# Patient Record
Sex: Female | Born: 1935 | Race: White | Hispanic: No | Smoking: Never smoker
Health system: Southern US, Community
[De-identification: ages and names within clinical notes are randomized; demographics above are authoritative.]

## PROBLEM LIST (undated history)

## (undated) DIAGNOSIS — C4490 Unspecified malignant neoplasm of skin, unspecified: Secondary | ICD-10-CM

## (undated) DIAGNOSIS — I4891 Unspecified atrial fibrillation: Secondary | ICD-10-CM

## (undated) DIAGNOSIS — K219 Gastro-esophageal reflux disease without esophagitis: Secondary | ICD-10-CM

## (undated) DIAGNOSIS — E039 Hypothyroidism, unspecified: Secondary | ICD-10-CM

## (undated) DIAGNOSIS — I10 Essential (primary) hypertension: Secondary | ICD-10-CM

## (undated) DIAGNOSIS — I779 Disorder of arteries and arterioles, unspecified: Secondary | ICD-10-CM

## (undated) DIAGNOSIS — R222 Localized swelling, mass and lump, trunk: Secondary | ICD-10-CM

## (undated) DIAGNOSIS — I739 Peripheral vascular disease, unspecified: Secondary | ICD-10-CM

## (undated) DIAGNOSIS — K625 Hemorrhage of anus and rectum: Secondary | ICD-10-CM

## (undated) DIAGNOSIS — I359 Nonrheumatic aortic valve disorder, unspecified: Secondary | ICD-10-CM

## (undated) DIAGNOSIS — F419 Anxiety disorder, unspecified: Secondary | ICD-10-CM

## (undated) DIAGNOSIS — R911 Solitary pulmonary nodule: Secondary | ICD-10-CM

## (undated) DIAGNOSIS — Z8673 Personal history of transient ischemic attack (TIA), and cerebral infarction without residual deficits: Secondary | ICD-10-CM

## (undated) HISTORY — DX: Unspecified malignant neoplasm of skin, unspecified: C44.90

## (undated) HISTORY — DX: Anxiety disorder, unspecified: F41.9

## (undated) HISTORY — DX: Hypothyroidism, unspecified: E03.9

## (undated) HISTORY — DX: Solitary pulmonary nodule: R91.1

## (undated) HISTORY — PX: CATARACT EXTRACTION BILATERAL W/ ANTERIOR VITRECTOMY: SHX1304

## (undated) HISTORY — DX: Disorder of arteries and arterioles, unspecified: I77.9

## (undated) HISTORY — DX: Unspecified atrial fibrillation: I48.91

## (undated) HISTORY — DX: Personal history of transient ischemic attack (TIA), and cerebral infarction without residual deficits: Z86.73

## (undated) HISTORY — PX: OTHER SURGICAL HISTORY: SHX169

## (undated) HISTORY — DX: Gastro-esophageal reflux disease without esophagitis: K21.9

## (undated) HISTORY — DX: Nonrheumatic aortic valve disorder, unspecified: I35.9

## (undated) HISTORY — DX: Essential (primary) hypertension: I10

## (undated) HISTORY — DX: Localized swelling, mass and lump, trunk: R22.2

## (undated) HISTORY — PX: TONSILLECTOMY: SUR1361

## (undated) HISTORY — PX: ABDOMINAL HYSTERECTOMY: SHX81

## (undated) HISTORY — DX: Peripheral vascular disease, unspecified: I73.9

---

## 1998-03-15 ENCOUNTER — Other Ambulatory Visit: Admission: RE | Admit: 1998-03-15 | Discharge: 1998-03-15 | Payer: Self-pay | Admitting: *Deleted

## 1999-04-20 ENCOUNTER — Ambulatory Visit (HOSPITAL_COMMUNITY): Admission: RE | Admit: 1999-04-20 | Discharge: 1999-04-20 | Payer: Self-pay | Admitting: *Deleted

## 1999-07-20 ENCOUNTER — Other Ambulatory Visit: Admission: RE | Admit: 1999-07-20 | Discharge: 1999-07-20 | Payer: Self-pay | Admitting: *Deleted

## 2000-01-03 ENCOUNTER — Encounter: Admission: RE | Admit: 2000-01-03 | Discharge: 2000-01-03 | Payer: Self-pay | Admitting: *Deleted

## 2000-01-03 ENCOUNTER — Encounter: Payer: Self-pay | Admitting: *Deleted

## 2000-08-21 ENCOUNTER — Other Ambulatory Visit: Admission: RE | Admit: 2000-08-21 | Discharge: 2000-08-21 | Payer: Self-pay | Admitting: *Deleted

## 2001-05-21 ENCOUNTER — Ambulatory Visit (HOSPITAL_COMMUNITY): Admission: RE | Admit: 2001-05-21 | Discharge: 2001-05-21 | Payer: Self-pay | Admitting: Internal Medicine

## 2001-05-21 ENCOUNTER — Encounter: Payer: Self-pay | Admitting: Internal Medicine

## 2002-10-16 ENCOUNTER — Encounter: Payer: Self-pay | Admitting: *Deleted

## 2002-10-16 ENCOUNTER — Emergency Department (HOSPITAL_COMMUNITY): Admission: EM | Admit: 2002-10-16 | Discharge: 2002-10-16 | Payer: Self-pay | Admitting: Emergency Medicine

## 2002-11-04 ENCOUNTER — Ambulatory Visit (HOSPITAL_COMMUNITY): Admission: RE | Admit: 2002-11-04 | Discharge: 2002-11-04 | Payer: Self-pay | Admitting: Internal Medicine

## 2003-12-28 ENCOUNTER — Other Ambulatory Visit: Admission: RE | Admit: 2003-12-28 | Discharge: 2003-12-28 | Payer: Self-pay | Admitting: *Deleted

## 2005-02-28 ENCOUNTER — Other Ambulatory Visit: Admission: RE | Admit: 2005-02-28 | Discharge: 2005-02-28 | Payer: Self-pay | Admitting: *Deleted

## 2005-11-20 ENCOUNTER — Encounter: Admission: RE | Admit: 2005-11-20 | Discharge: 2005-11-20 | Payer: Self-pay | Admitting: Orthopedic Surgery

## 2006-04-22 ENCOUNTER — Ambulatory Visit: Payer: Self-pay | Admitting: Cardiology

## 2006-05-09 ENCOUNTER — Ambulatory Visit: Payer: Self-pay | Admitting: Cardiology

## 2006-06-10 ENCOUNTER — Ambulatory Visit: Payer: Self-pay | Admitting: Cardiology

## 2006-09-30 ENCOUNTER — Other Ambulatory Visit: Admission: RE | Admit: 2006-09-30 | Discharge: 2006-09-30 | Payer: Self-pay | Admitting: *Deleted

## 2007-12-16 ENCOUNTER — Other Ambulatory Visit: Admission: RE | Admit: 2007-12-16 | Discharge: 2007-12-16 | Payer: Self-pay | Admitting: *Deleted

## 2008-06-17 ENCOUNTER — Ambulatory Visit: Payer: Self-pay | Admitting: Cardiology

## 2008-07-08 ENCOUNTER — Encounter: Payer: Self-pay | Admitting: Cardiology

## 2008-08-31 ENCOUNTER — Ambulatory Visit: Payer: Self-pay | Admitting: Cardiology

## 2008-09-01 ENCOUNTER — Encounter: Payer: Self-pay | Admitting: Physician Assistant

## 2008-11-24 ENCOUNTER — Encounter: Payer: Self-pay | Admitting: Cardiology

## 2008-11-24 ENCOUNTER — Ambulatory Visit: Payer: Self-pay | Admitting: Cardiology

## 2008-12-07 ENCOUNTER — Ambulatory Visit: Payer: Self-pay | Admitting: Cardiology

## 2008-12-27 ENCOUNTER — Encounter: Payer: Self-pay | Admitting: Cardiology

## 2009-01-11 ENCOUNTER — Encounter: Payer: Self-pay | Admitting: Cardiology

## 2009-01-20 ENCOUNTER — Ambulatory Visit: Payer: Self-pay | Admitting: Cardiology

## 2009-03-22 ENCOUNTER — Encounter: Payer: Self-pay | Admitting: Cardiology

## 2009-03-30 ENCOUNTER — Encounter: Payer: Self-pay | Admitting: Cardiology

## 2009-07-19 ENCOUNTER — Encounter: Payer: Self-pay | Admitting: Cardiology

## 2009-10-03 DIAGNOSIS — R930 Abnormal findings on diagnostic imaging of skull and head, not elsewhere classified: Secondary | ICD-10-CM | POA: Insufficient documentation

## 2009-10-03 DIAGNOSIS — E785 Hyperlipidemia, unspecified: Secondary | ICD-10-CM

## 2009-10-03 DIAGNOSIS — R002 Palpitations: Secondary | ICD-10-CM

## 2010-02-14 ENCOUNTER — Ambulatory Visit: Payer: Self-pay | Admitting: Cardiology

## 2010-02-14 DIAGNOSIS — I4891 Unspecified atrial fibrillation: Secondary | ICD-10-CM

## 2010-03-06 ENCOUNTER — Encounter: Payer: Self-pay | Admitting: Cardiology

## 2010-03-08 ENCOUNTER — Encounter: Payer: Self-pay | Admitting: Cardiology

## 2010-03-27 ENCOUNTER — Ambulatory Visit: Payer: Self-pay | Admitting: Cardiology

## 2010-03-27 DIAGNOSIS — I10 Essential (primary) hypertension: Secondary | ICD-10-CM | POA: Insufficient documentation

## 2010-03-28 ENCOUNTER — Telehealth (INDEPENDENT_AMBULATORY_CARE_PROVIDER_SITE_OTHER): Payer: Self-pay | Admitting: *Deleted

## 2010-03-29 ENCOUNTER — Ambulatory Visit: Payer: Self-pay | Admitting: Cardiology

## 2010-03-30 ENCOUNTER — Emergency Department (HOSPITAL_COMMUNITY): Admission: EM | Admit: 2010-03-30 | Discharge: 2010-03-30 | Payer: Self-pay | Admitting: Emergency Medicine

## 2010-03-30 ENCOUNTER — Encounter: Payer: Self-pay | Admitting: Cardiology

## 2010-03-31 ENCOUNTER — Telehealth (INDEPENDENT_AMBULATORY_CARE_PROVIDER_SITE_OTHER): Payer: Self-pay | Admitting: *Deleted

## 2010-04-03 ENCOUNTER — Encounter: Payer: Self-pay | Admitting: Cardiology

## 2010-04-17 ENCOUNTER — Encounter: Payer: Self-pay | Admitting: Cardiology

## 2010-06-15 ENCOUNTER — Observation Stay (HOSPITAL_COMMUNITY): Admission: EM | Admit: 2010-06-15 | Discharge: 2010-06-16 | Payer: Self-pay | Admitting: Emergency Medicine

## 2010-06-15 ENCOUNTER — Ambulatory Visit: Payer: Self-pay | Admitting: Cardiology

## 2010-06-21 ENCOUNTER — Encounter: Payer: Self-pay | Admitting: Physician Assistant

## 2010-06-23 DIAGNOSIS — E876 Hypokalemia: Secondary | ICD-10-CM

## 2010-07-05 ENCOUNTER — Encounter: Payer: Self-pay | Admitting: Cardiology

## 2010-07-07 ENCOUNTER — Encounter (INDEPENDENT_AMBULATORY_CARE_PROVIDER_SITE_OTHER): Payer: Self-pay | Admitting: *Deleted

## 2010-07-19 ENCOUNTER — Telehealth (INDEPENDENT_AMBULATORY_CARE_PROVIDER_SITE_OTHER): Payer: Self-pay | Admitting: *Deleted

## 2010-07-19 ENCOUNTER — Ambulatory Visit: Payer: Self-pay | Admitting: Cardiology

## 2010-08-09 ENCOUNTER — Ambulatory Visit: Payer: Self-pay | Admitting: Cardiology

## 2010-08-16 ENCOUNTER — Telehealth (INDEPENDENT_AMBULATORY_CARE_PROVIDER_SITE_OTHER): Payer: Self-pay | Admitting: *Deleted

## 2010-09-13 ENCOUNTER — Ambulatory Visit: Payer: Self-pay | Admitting: Cardiology

## 2010-09-20 ENCOUNTER — Encounter: Payer: Self-pay | Admitting: Cardiology

## 2010-09-20 ENCOUNTER — Encounter: Payer: Self-pay | Admitting: Physician Assistant

## 2010-09-21 ENCOUNTER — Ambulatory Visit: Payer: Self-pay | Admitting: Cardiology

## 2010-09-22 ENCOUNTER — Encounter: Payer: Self-pay | Admitting: Cardiology

## 2010-11-03 ENCOUNTER — Ambulatory Visit: Payer: Self-pay | Admitting: Cardiology

## 2010-11-03 DIAGNOSIS — R079 Chest pain, unspecified: Secondary | ICD-10-CM | POA: Insufficient documentation

## 2010-11-06 ENCOUNTER — Encounter: Payer: Self-pay | Admitting: Cardiology

## 2010-12-04 ENCOUNTER — Emergency Department (HOSPITAL_COMMUNITY)
Admission: EM | Admit: 2010-12-04 | Discharge: 2010-12-04 | Payer: Self-pay | Source: Home / Self Care | Admitting: Emergency Medicine

## 2010-12-31 ENCOUNTER — Encounter: Payer: Self-pay | Admitting: Cardiology

## 2011-01-01 ENCOUNTER — Encounter: Payer: Self-pay | Admitting: Cardiology

## 2011-01-18 NOTE — Progress Notes (Signed)
Summary: Office Visit/ BLOOD PRESSURE READINGS  Office Visit/ BLOOD PRESSURE READINGS   Imported By: Dorise Hiss 04/03/2010 14:33:36  _____________________________________________________________________  External Attachment:    Type:   Image     Comment:   External Document

## 2011-01-18 NOTE — Assessment & Plan Note (Signed)
Summary: f/u [place]   Visit Type:  Follow-up Primary Provider:  Sherryll Burger  CC:  follow-up visit.  History of Present Illness: the patient is an 77 year old female with a history of paroxysmal atrial fibrillation on Rythmol therapy, but declines Coumadin. The patient also had a recent Lexiscan done with no evidence of ischemia. She does have a strong family history of coronary artery disease but has had no prior myocardial infarction or documented coronary artery disease. The patient was taking Lipitor per her primary care physician but this was discontinued because of myalgias. She has now been switched to gemfibrozil. She appears to be tolerating for propafenone. She does have occasional palpitations particularly in the evening at which point she takes a diazepam improvement in her symptoms. Stress will also cause palpitations. She denies any orthopnea PND she reports no chest pain.  Preventive Screening-Counseling & Management  Alcohol-Tobacco     Smoking Status: never  Current Problems (verified): 1)  Atrial Fibrillation, Paroxysmal  (ICD-427.31) 2)  Abnormal Chest X-ray  (ICD-793.1) 3)  Palpitations  (ICD-785.1) 4)  Hyperlipidemia-mixed  (ICD-272.4)  Current Medications (verified): 1)  Atenolol 50 Mg Tabs (Atenolol) .... Take 1 Tablet By Mouth Twice A Day 2)  Plavix 75 Mg Tabs (Clopidogrel Bisulfate) .... Take 1 Tablet By Mouth Once A Day 3)  Aspir-Low 81 Mg Tbec (Aspirin) .... Take 1 Tablet By Mouth Once A Day 4)  Propafenone Hcl 150 Mg Tabs (Propafenone Hcl) .... Take 1 Tablet By [facility] A Day 5)  Vitamin D 1000 Unit Tabs (Cholecalciferol) .... Take 1 Tablet By Mouth Once A Day 6)  Synthroid 88 Mcg Tabs (Levothyroxine Sodium) .... Take 1 Tablet By Mouth Once A Day 7)  Calcium Magnesium 500-100-500 Liqd (Calcium-Magnesium-Vitamin D) .... Take 2 Tablet By Mouth Two Times A Day 8)  Hydrocodone-Acetaminophen 5-500 Mg Tabs (Hydrocodone-Acetaminophen) .... Take 1/2 Tablet By Mouth Once A  Day As Needed 9)  Diazepam 5 Mg Tabs (Diazepam) .... Take 1 Tablet By Mouth Once A Day As Needed 10)  Bumetanide 1 Mg Tabs (Bumetanide) .... Take 1 Tablet By Mouth Once A Day As Needed 11)  Meclizine Hcl 12.5 Mg Tabs (Meclizine Hcl) .... Take 1 Tablet By Mouth Three Times A Day As Needed 12)  Gemfibrozil 600 Mg Tabs (Gemfibrozil) .... Take 1 Tablet By Mouth Two Times A Day  Allergies (verified): 1)  ! Pcn  Comments:  Nurse/Medical Assistant: The patient is currently on medications but does not know the name or dosage at this time. Instructed to contact our office with details. Will update medication list at that time.  Past History:  Past Medical History: Last updated: 02/14/2010 ABNORMAL CHEST X-RAY (ICD-793.1) PALPITATIONS (ICD-785.1) HYPERLIPIDEMIA-MIXED (ICD-272.4) Hypothyroidism paroxysmal intrafibrillation the patient declines Coumadin therapy  Past Surgical History: Last updated: 10/03/2009 Abdominal Hysterectomy-Total Tonsillectomy  Family History: Last updated: 10/03/2009 Family History of CVA or Stroke:  Family History of Diabetes:   Social History: Last updated: 10/03/2009 Retired  Widowed  Tobacco Use - No.  Alcohol Use - no  Risk Factors: Smoking Status: never (03/27/2010)  Review of Systems       The patient complains of palpitations.  The patient denies fatigue, malaise, fever, weight gain/loss, vision loss, decreased hearing, hoarseness, chest pain, shortness of breath, prolonged cough, wheezing, sleep apnea, coughing up blood, abdominal pain, blood in stool, nausea, vomiting, diarrhea, heartburn, incontinence, blood in urine, muscle weakness, joint pain, leg swelling, rash, skin lesions, headache, fainting, dizziness, depression, anxiety, enlarged lymph nodes, easy bruising or  bleeding, and environmental allergies.    Vital Signs:  Patient profile:   77 year old female Height:      66 inches Weight:      140 pounds Pulse rate:   51 / minute BP  sitting:   173 / 91  (left arm) Cuff size:   regular  Vitals Entered By: Carlye Grippe (March 27, 2010 10:17 AM) CC: follow-up visit   Physical Exam  Additional Exam:  General: Well-developed, well-nourished in no distress head: Normocephalic and atraumatic eyes PERRLA/EOMI intact, conjunctiva and lids normal nose: No deformity or lesions mouth normal dentition, normal posterior pharynx neck: Supple, no JVD.  No masses, thyromegaly or abnormal cervical nodes lungs: Normal breath sounds bilaterally without wheezing.  Normal percussion heart: regular rate and rhythm with normal S1 and S2, no S3 or S4.  PMI is normal.  No pathological murmurs abdomen: Normal bowel sounds, abdomen is soft and nontender without masses, organomegaly or hernias noted.  No hepatosplenomegaly musculoskeletal: Back normal, normal gait muscle strength and tone normal pulsus: Pulse is normal in all 4 extremities Extremities: No peripheral pitting edema neurologic: Alert and oriented x 3 skin: Intact without lesions or rashes cervical nodes: No significant adenopathy psychologic: Normal affect    EKG  Procedure date:  03/27/2010  Findings:      normal sinus rhythm with first-degree AV block heart rate 62 beats per minute  Impression & Recommendations:  Problem # 1:  PALPITATIONS (ICD-785.1) stable improved. Continue propafenone. I told the patient that she has palpitations in the evening to take half of her Valium at half the dose of atenolol Her updated medication list for this problem includes:    Atenolol 50 Mg Tabs (Atenolol) .Marland Kitchen... Take 1 tablet by mouth twice a day    Plavix 75 Mg Tabs (Clopidogrel bisulfate) .Marland Kitchen... Take 1 tablet by mouth once a day    Aspir-low 81 Mg Tbec (Aspirin) .Marland Kitchen... Take 1 tablet by mouth once a day    Propafenone Hcl 150 Mg Tabs (Propafenone hcl) .Marland Kitchen... Take 1 tablet by mouthywice a day  Problem # 2:  HYPERLIPIDEMIA-MIXED (ICD-272.4) followed by primary care physician. The  patient is unable to tolerate some of the statin drugs. I suggested she may try pravastatin. She has been started on gemfibrozil by primary care physician. The following medications were removed from the medication list:    Lipitor 10 Mg Tabs (Atorvastatin calcium) .Marland Kitchen... Take 1 tablet by mouth once a day Her updated medication list for this problem includes:    Gemfibrozil 600 Mg Tabs (Gemfibrozil) .Marland Kitchen... Take 1 tablet by mouth two times a day  Problem # 3:  ATRIAL FIBRILLATION, PAROXYSMAL (ICD-427.31) clinically stable. Continue propafenone. The patient has a junctionall rhythm but it appears that she is now in sinus rhythm. Her updated medication list for this problem includes:    Atenolol 50 Mg Tabs (Atenolol) .Marland Kitchen... Take 1 tablet by mouth twice a day    Plavix 75 Mg Tabs (Clopidogrel bisulfate) .Marland Kitchen... Take 1 tablet by mouth once a day    Aspir-low 81 Mg Tbec (Aspirin) .Marland Kitchen... Take 1 tablet by mouth once a day    Propafenone Hcl 150 Mg Tabs (Propafenone hcl) .Marland Kitchen... Take 1 tablet by mouthywice a day  Orders: EKG w/ Interpretation (93000)  Problem # 4:  ESSENTIAL HYPERTENSION, BENIGN (ICD-401.1) the patientblood pressure is initially elevated. The patient does not know if her home blood pressure cuff is working. She will bring it in this week for calibration  and blood pressure is elevated we will need to adjust her medications. Her updated medication list for this problem includes:    Atenolol 50 Mg Tabs (Atenolol) .Marland Kitchen... Take 1 tablet by mouth twice a day    Aspir-low 81 Mg Tbec (Aspirin) .Marland Kitchen... Take 1 tablet by mouth once a day    Bumetanide 1 Mg Tabs (Bumetanide) .Marland Kitchen... Take 1 tablet by mouth once a day as needed  Patient Instructions: 1)  Bring in BP cuff for calibration check  2)  Follow up in  6 months

## 2011-01-18 NOTE — Progress Notes (Signed)
Summary: elevated bp  Phone Note Other Incoming   Summary of Call: Pt. walked into office wanting to discuss elevated blood pressure and headache.  Stated she had went to [facility] yesterday because she was woke up in the middle of the night with really, really bad headache.  Per Dr. Andee Lineman, need to start beta blocker.  Norvasc 5mg  daily.  Pt. aware. Initial call taken by: Hoover Brunette, LPN,  April 03, 2010 9:25 AM

## 2011-01-18 NOTE — Consult Note (Signed)
Summary: CARDIOLOGY CONSULT/ [facility]  CARDIOLOGY CONSULT/ [facility]   Imported By: Zachary George 11/02/2010 17:00:31  _____________________________________________________________________  External Attachment:    Type:   Image     Comment:   External Document

## 2011-01-18 NOTE — Procedures (Signed)
Summary: Holter and Event/ [facility] END OF SERVICE SUMMARY REPORT  Holter and Event/ [facility] END OF SERVICE SUMMARY REPORT   Imported By: Dorise Hiss 02/13/2010 16:06:50  _____________________________________________________________________  External Attachment:    Type:   Image     Comment:   External Document

## 2011-01-18 NOTE — Letter (Signed)
Summary: [facility] D/C DR. Beatrix Fetters [facility]  [facility] D/C DR. Kirstie Peri   Imported By: Zachary George 11/03/2010 08:32:50  _____________________________________________________________________  External Attachment:    Type:   Image     Comment:   External Document

## 2011-01-18 NOTE — Miscellaneous (Signed)
Summary: rx - Norvasc  Clinical Lists Changes  Medications: Added new medication of NORVASC 5 MG TABS (AMLODIPINE BESYLATE) Take 1 tablet by mouth once a day - Signed Rx of NORVASC 5 MG TABS (AMLODIPINE BESYLATE) Take 1 tablet by mouth once a day;  #30 x 6;  Signed;  Entered by: Hoover Brunette, LPN;  Authorized by: Lewayne Bunting, MD, [facility];  Method used: Telephoned to [facility] # [phone]*, [address], [place], [place]  [address], Ph: [phone] or [phone], Fax: [phone]    Prescriptions: NORVASC 5 MG TABS (AMLODIPINE BESYLATE) Take 1 tablet by mouth once a day  #30 x 6   Entered by:   Hoover Brunette, LPN   Authorized by:   Lewayne Bunting, MD, [facility]   Signed by:   Hoover Brunette, LPN on 52/84/1324   Method used:   Telephoned to ...       [facility]  [address] # [phone]* (retail)       [address]       [place], [place]  [address]       Ph: [phone] or [phone]       Fax: [phone]   RxID:   [id number]

## 2011-01-18 NOTE — Progress Notes (Signed)
Summary: elevated bp, er visit   Phone Note Call from Patient   Summary of Call: Was seen in office yesterday morning.  Woke up in middle of night around 3 a.m. with splitting headache.  BP 197/93  HR 68.  Went to ER and stayed couple of hours and then sent home.  Did give one pill there (Clonidine 0.1mg  x 1) and sent home with rx for HCTZ 12.5mg .  Does have nurse visit in the morning to calibrate her machine with ours.  States she did start her Gemfibrozil last p.m. but after taking did not feel well.  Felt like she was hyper or something, hard to describe.  Do you want her to start HCTZ yet?   Initial call taken by: Hoover Brunette, LPN,  March 28, 2010 8:56 AM  Follow-up for Phone Call        As discussed would give chlortalidone 25 mg and not start gemfibrozil.  Follow-up by: Lewayne Bunting, MD, [facility],  March 29, 2010 1:33 PM  Additional Follow-up for Phone Call Additional follow up Details #1::        Pt. aware of above.  Additional Follow-up by: Hoover Brunette, LPN,  March 31, 2010 10:30 AM

## 2011-01-18 NOTE — Assessment & Plan Note (Signed)
Summary: eph-post [facility] 10/7   Visit Type:  Follow-up Primary Provider:  Sherryll Burger   History of Present Illness: the patient was recently admitted to [facility]. She had chest pain and ruled out for myocardial infarction. She also paroxysmal fibrillation with tachybradycardia syndrome. She had a stress test which was negative. the patient now presents for followup. A few palpitations.  No scp. No sob. No syncope. Tolerating meds. Some leg cramps.  In atrial fibrillation in office. Patient refuses coumadin but willing to consider pradaxa. If holter afib then d/c propafenone and plavix and start pradaxa.   Preventive Screening-Counseling & Management  Alcohol-Tobacco     Smoking Status: never  Current Medications (verified): 1)  Atenolol 50 Mg Tabs (Atenolol) .... Take 1 Tablet By Mouth Twice A Day 2)  Plavix 75 Mg Tabs (Clopidogrel Bisulfate) .... Take 1 Tablet By Mouth Once A Day 3)  Aspir-Low 81 Mg Tbec (Aspirin) .... Take 1 Tablet By Mouth Once A Day 4)  Propafenone Hcl 150 Mg Tabs (Propafenone Hcl) .... Take 1 Tablet By Mouth Twice A Day 5)  Vitamin D 1000 Unit Tabs (Cholecalciferol) .... Take 1 Tablet By Mouth Once A Day 6)  Synthroid 88 Mcg Tabs (Levothyroxine Sodium) .... Take 1 Tablet By Mouth Once A Day 7)  Calcium Magnesium 500-100-500 Liqd (Calcium-Magnesium-Vitamin D) .... Take 2 Tablet By Mouth Two Times A Day 8)  Diazepam 5 Mg Tabs (Diazepam) .... Take 1/2 Tablet By Mouth Once A Day As Needed 9)  Spironolactone 25 Mg Tabs (Spironolactone) .... Take 1 Tablet By Mouth Once A Day 10)  Pravastatin Sodium 20 Mg Tabs (Pravastatin Sodium) .... Take 1 Tablet By Mouth Once A Day 11)  Zantac 150 Mg Tabs (Ranitidine Hcl) .... Take 1 Tablet By Mouth Once A Day As Needed 12)  Clorazepate Dipotassium 3.75 Mg Tabs (Clorazepate Dipotassium) .... Take 1 Tablet By Mouth Once A Day As Needed  Allergies (verified): 1)  ! Pcn  Comments:  Nurse/Medical Assistant: The patient's  medication list and allergies were reviewed with the patient and were updated in the Medication and Allergy Lists.  Past History:  Past Medical History: Last updated: 02/14/2010 ABNORMAL CHEST X-RAY (ICD-793.1) PALPITATIONS (ICD-785.1) HYPERLIPIDEMIA-MIXED (ICD-272.4) Hypothyroidism paroxysmal intrafibrillation the patient declines Coumadin therapy  Review of Systems       The patient complains of palpitations.  The patient denies fatigue, malaise, fever, weight gain/loss, vision loss, decreased hearing, hoarseness, chest pain, shortness of breath, prolonged cough, wheezing, sleep apnea, coughing up blood, abdominal pain, blood in stool, nausea, vomiting, diarrhea, heartburn, incontinence, blood in urine, muscle weakness, joint pain, leg swelling, rash, skin lesions, headache, fainting, dizziness, depression, anxiety, enlarged lymph nodes, easy bruising or bleeding, and environmental allergies.    Vital Signs:  Patient profile:   77 year old female Height:      66 inches Weight:      136 pounds Pulse rate:   50 / minute BP sitting:   138 / 81  (left arm) Cuff size:   regular  Vitals Entered By: Carlye Grippe (November 03, 2010 10:24 AM)  Physical Exam  Additional Exam:  General: Well-developed, well-nourished in no distress head: Normocephalic and atraumatic eyes PERRLA/EOMI intact, conjunctiva and lids normal nose: No deformity or lesions mouth normal dentition, normal posterior pharynx neck: Supple, no JVD.  No masses, thyromegaly or abnormal cervical nodes lungs: Normal breath sounds bilaterally without wheezing.  Normal percussion heart: regular rate and rhythm with normal S1 and S2, no S3 or S4.  PMI is normal.  No pathological murmurs abdomen: Normal bowel sounds, abdomen is soft and nontender without masses, organomegaly or hernias noted.  No hepatosplenomegaly musculoskeletal: Back normal, normal gait muscle strength and tone normal pulsus: Pulse is normal in all 4  extremities Extremities: No peripheral pitting edema neurologic: Alert and oriented x 3 skin: Intact without lesions or rashes cervical nodes: No significant adenopathy psychologic: Normal affect    Impression & Recommendations:  Problem # 1:  ATRIAL FIBRILLATION, PAROXYSMAL (ICD-427.31) we will schedule Holter monitor and if the patient in intrafibrillation and we will discontinue propafenone as well as Plavix and start dabigatran Her updated medication list for this problem includes:    Atenolol 50 Mg Tabs (Atenolol) .Marland Kitchen... Take 1 tablet by mouth twice a day    Plavix 75 Mg Tabs (Clopidogrel bisulfate) .Marland Kitchen... Take 1 tablet by mouth once a day    Aspir-low 81 Mg Tbec (Aspirin) .Marland Kitchen... Take 1 tablet by mouth once a day    Propafenone Hcl 150 Mg Tabs (Propafenone hcl) .Marland Kitchen... Take 1 tablet by mouth twice a day  Orders: EKG w/ Interpretation (93000) Holter Monitor (Holter Monitor)  Problem # 2:  PALPITATIONS (ICD-785.1) rare palpitations were reviewed cardiac monitor The following medications were removed from the medication list:    Norvasc 5 Mg Tabs (Amlodipine besylate) .Marland Kitchen... Take 1 tablet by mouth once a day Her updated medication list for this problem includes:    Atenolol 50 Mg Tabs (Atenolol) .Marland Kitchen... Take 1 tablet by mouth twice a day    Plavix 75 Mg Tabs (Clopidogrel bisulfate) .Marland Kitchen... Take 1 tablet by mouth once a day    Aspir-low 81 Mg Tbec (Aspirin) .Marland Kitchen... Take 1 tablet by mouth once a day    Propafenone Hcl 150 Mg Tabs (Propafenone hcl) .Marland Kitchen... Take 1 tablet by mouth twice a day  Problem # 3:  CHEST PAIN (ICD-786.50) the patient was recently hospitalized for chest pain.  Enzymes were negative. The following medications were removed from the medication list:    Norvasc 5 Mg Tabs (Amlodipine besylate) .Marland Kitchen... Take 1 tablet by mouth once a day Her updated medication list for this problem includes:    Atenolol 50 Mg Tabs (Atenolol) .Marland Kitchen... Take 1 tablet by mouth twice a day    Plavix 75  Mg Tabs (Clopidogrel bisulfate) .Marland Kitchen... Take 1 tablet by mouth once a day    Aspir-low 81 Mg Tbec (Aspirin) .Marland Kitchen... Take 1 tablet by mouth once a day  Patient Instructions: 1)  48 hour holter monitor 2)  Follow up in  3 months

## 2011-01-18 NOTE — Letter (Signed)
Summary: Engineer, materials at [facility]  [address]   [place], [place] [address]   Phone: [phone]  Fax: [phone]        July 07, 2010 MRN: [id number]   NOEMIE DEVIVO [address] [place], [place]  [address]   Dear Ms. Alona Bene,  Your test ordered by Selena Batten has been reviewed by your physician (or physician assistant) and was found to be normal or stable. Your physician (or physician assistant) felt no changes were needed at this time.  ____ Echocardiogram  ____ Cardiac Stress Test  _X__ Lab Work  ____ Peripheral vascular study of arms, legs or neck  ____ CT scan or X-ray  ____ Lung or Breathing test  ____ Other:   Thank you.   Hoover Brunette, LPN    Duane Boston, M.D., F.A.C.C. Thressa Sheller, M.D., F.A.C.C. Oneal Grout, M.D., F.A.C.C. Cheree Ditto, M.D., F.A.C.C. Daiva Nakayama, M.D., F.A.C.C. Kenney Houseman, M.D., F.A.C.C. Jeanne Ivan, PA-C

## 2011-01-18 NOTE — Progress Notes (Signed)
Summary: patient no longer coming here  Phone Note Call from Patient Call back at Home Phone [phone]   Caller: Patient Reason for Call: Talk to Nurse, Talk to Doctor Details for Reason: no longer coming to our office Summary of Call: She is seeing in a Doctor in [place] and she will be moving to [place] soon and daughter thought she should go a Said that Dr. Andee Lineman is the best in her book and will recommend to everyone. Wanted Dr. Andee Lineman to know it had nothing to do with him  Initial call taken by: Claudette Laws,  August 16, 2010 10:51 AM  Follow-up for Phone Call        No problem.  Follow-up by: Lewayne Bunting, MD, [facility],  August 16, 2010 12:47 PM

## 2011-01-18 NOTE — Progress Notes (Signed)
  Phone Note From Other Clinic   Caller: Cindy/[place] [facility] Initial call taken by: km    All Cardiac faxed over to [phone] [facility]  July 19, 2010 11:11 AM

## 2011-01-18 NOTE — Progress Notes (Signed)
Summary: Office Visit/ BLOOD PRESSURE READINGS  Office Visit/ BLOOD PRESSURE READINGS   Imported By: Dorise Hiss 04/20/2010 12:17:21  _____________________________________________________________________  External Attachment:    Type:   Image     Comment:   External Document

## 2011-01-18 NOTE — Assessment & Plan Note (Signed)
Summary: bp check - [facility]  Nurse Visit   Vital Signs:  Patient profile:   77 year old female Height:      66 inches Weight:      141 pounds Pulse rate:   58 / minute BP sitting:   154 / 81  (left arm) Cuff size:   regular  Vitals Entered By: Carlye Grippe (March 29, 2010 8:49 AM) CC: nurse bp check Comments patient seen in ED yesterday and was given rx for hctz 12.5mg  by physician and wants to make sure its okay to take this medication.  VH:QIONGE-XB HTN--yes [id number] meds?--yes Side effects?--no Chest pain, SOB, Dizziness?--no A/P: 1. HTN (401.1)             At goal?              If no, physician will be notified.              Follow up in ...Marland KitchenMarland KitchenMarland Kitchen  5 minutes was spent with the patient.        Serial Vital Signs/Assessments:  Time      Position  BP       Pulse  Resp  Temp     By 8:50 AM             167/83   58                    Carlye Grippe  Comments: 8:50 AM right arm reg cuff By: Carlye Grippe    Visit Type:  nurse bp check  CC:  nurse bp check.   Preventive Screening-Counseling & Management  Alcohol-Tobacco     Smoking Status: never  Patient Instructions: 1)  Your physician has recommended you make the following change in your medication: START CHLORTHALIDONE 25MG  DAILY. Your medication has been sent to your pharmacy.   Allergies: 1)  ! Pcn  Orders Added: 1)  Est. Patient Level I [10272] Prescriptions: CHLORTHALIDONE 25 MG TABS (CHLORTHALIDONE) Take 1 tablet by mouth once a day  #30 x 6   Entered by:   Carlye Grippe   Authorized by:   Lewayne Bunting, MD, [facility]   Signed by:   Carlye Grippe on 03/29/2010   Method used:   Electronically to        [facility] # [phone]* (retail)       [address]       [place], [place]  [address]       Ph: [phone] or [phone]       Fax: [phone]   RxID:   [id number]

## 2011-01-18 NOTE — Assessment & Plan Note (Signed)
Summary: 6 MONTH FU-RECV REMINDER VS   Visit Type:  Follow-up Primary Provider:  Sherryll Burger  CC:  follow-up visit.  History of Present Illness: the patient is an 77 year old female with a history of paroxysmal atrial fibrillation on Rythmol therapy.the patient presents for follow-up.  She reports palpitations off-and-on.  She states she can tell when she has she has atrial fibrillation because of her irregular heart beat.  The patient denies however any presyncope or syncope.  She also does not feel particular short of breath during these episodes.  She reports no chest pain.  She reports no orthopnea PND.  She states that her episodes of irregular heart beat are not more frequent than they used to be  Clinical Review Panels:  CXR CXR results Heart size is normal.         No pleural effusion or pulmonary interstitial edema.         Advanced interstitial change consistent with COPD is noted.         No airspace consolidation identified.                   IMPRESSION:         1.  No active cardiopulmonary abnormalities.         2.  COPD. (01/11/2009)    Preventive Screening-Counseling & Management  Alcohol-Tobacco     Smoking Status: never  Current Medications (verified): 1)  Atenolol 50 Mg Tabs (Atenolol) .... Take 1 Tablet By Mouth Twice A Day 2)  Plavix 75 Mg Tabs (Clopidogrel Bisulfate) .... Take 1 Tablet By Mouth Once A Day 3)  Aspir-Low 81 Mg Tbec (Aspirin) .... Take 1 Tablet By Mouth Once A Day 4)  Propafenone Hcl 150 Mg Tabs (Propafenone Hcl) .... Take 1 Tablet By [facility] A Day 5)  Vitamin D 1000 Unit Tabs (Cholecalciferol) .... Take 1 Tablet By Mouth Once A Day 6)  Synthroid 88 Mcg Tabs (Levothyroxine Sodium) .... Take 1 Tablet By Mouth Once A Day 7)  Lipitor 10 Mg Tabs (Atorvastatin Calcium) .... Take 1 Tablet By Mouth Once A Day 8)  Calcium Magnesium 500-100-500 Liqd (Calcium-Magnesium-Vitamin D) .... Take 2 Tablet By Mouth Two Times A Day 9)  Hydrocodone-Acetaminophen  5-500 Mg Tabs (Hydrocodone-Acetaminophen) .... Take 1/2 Tablet By Mouth Once A Day As Needed 10)  Diazepam 5 Mg Tabs (Diazepam) .... Take 1 Tablet By Mouth Once A Day As Needed 11)  Bumetanide 1 Mg Tabs (Bumetanide) .... Take 1 Tablet By Mouth Once A Day As Needed 12)  Meclizine Hcl 12.5 Mg Tabs (Meclizine Hcl) .... Take 1 Tablet By Mouth Three Times A Day As Needed  Allergies (verified): 1)  ! Pcn  Comments:  Nurse/Medical Assistant: The patient's medications and allergies were reviewed with the patient and were updated in the Medication and Allergy Lists. List reviewed.  Past History:  Past Surgical History: Last updated: 10/03/2009 Abdominal Hysterectomy-Total Tonsillectomy  Family History: Last updated: 10/03/2009 Family History of CVA or Stroke:  Family History of Diabetes:   Social History: Last updated: 10/03/2009 Retired  Widowed  Tobacco Use - No.  Alcohol Use - no  Risk Factors: Smoking Status: never (02/14/2010)  Past Medical History: ABNORMAL CHEST X-RAY (ICD-793.1) PALPITATIONS (ICD-785.1) HYPERLIPIDEMIA-MIXED (ICD-272.4) Hypothyroidism paroxysmal intrafibrillation the patient declines Coumadin therapy  Review of Systems  The patient denies fatigue, malaise, fever, weight gain/loss, vision loss, decreased hearing, hoarseness, chest pain, palpitations, shortness of breath, prolonged cough, wheezing, sleep apnea, coughing up blood, abdominal pain, blood in  stool, nausea, vomiting, diarrhea, heartburn, incontinence, blood in urine, muscle weakness, joint pain, leg swelling, rash, skin lesions, headache, fainting, dizziness, depression, anxiety, enlarged lymph nodes, easy bruising or bleeding, and environmental allergies.    Vital Signs:  Patient profile:   77 year old female Height:      66 inches Weight:      139 pounds BMI:     22.52 Pulse rate:   53 / minute BP sitting:   178 / 79  (left arm) Cuff size:   regular  Vitals Entered By: Carlye Grippe  (February 14, 2010 3:31 PM) CC: follow-up visit   Physical Exam  Additional Exam:  General: Well-developed, well-nourished in no distress head: Normocephalic and atraumatic eyes PERRLA/EOMI intact, conjunctiva and lids normal nose: No deformity or lesions mouth normal dentition, normal posterior pharynx neck: Supple, no JVD.  No masses, thyromegaly or abnormal cervical nodes lungs: Normal breath sounds bilaterally without wheezing.  Normal percussion heart: regular rate and rhythm with normal S1 and S2, no S3 or S4.  PMI is normal.  No pathological murmurs abdomen: Normal bowel sounds, abdomen is soft and nontender without masses, organomegaly or hernias noted.  No hepatosplenomegaly musculoskeletal: Back normal, normal gait muscle strength and tone normal pulsus: Pulse is normal in all 4 extremities Extremities: No peripheral pitting edema neurologic: Alert and oriented x 3 skin: Intact without lesions or rashes cervical nodes: No significant adenopathy psychologic: Normal affect    EKG  Procedure date:  02/14/2010  Findings:      junctional rhythm with old septal infarct pattern.  Heart rate 59 beats/min  Impression & Recommendations:  Problem # 1:  ATRIAL FIBRILLATION, PAROXYSMAL (ICD-427.31) the patient reports occasional palpitations.  However they are not very long-lived.  She is also relatively asymptomatic.  We discussed the issue to stop Rythmol.  The patient actually declines to do this.  She will continue her current medical therapy and will reassess the efficacy of Rythmol in 4 weeks.the patient continues to decline Coumadin therapy.  Of note on EKG today the patient appears to be in junctional rhythm. Her updated medication list for this problem includes:    Atenolol 50 Mg Tabs (Atenolol) .Marland Kitchen... Take 1 tablet by mouth twice a day    Plavix 75 Mg Tabs (Clopidogrel bisulfate) .Marland Kitchen... Take 1 tablet by mouth once a day    Aspir-low 81 Mg Tbec (Aspirin) .Marland Kitchen... Take 1 tablet by  mouth once a day    Propafenone Hcl 150 Mg Tabs (Propafenone hcl) .Marland Kitchen... Take 1 tablet by mouthywice a day  Problem # 2:  ABNORMAL CHEST X-RAY (ICD-793.1) Assessment: Improved  Problem # 3:  HYPERLIPIDEMIA-MIXED (ICD-272.4) bloodwork is followed by Dr. Sherryll Burger. Her updated medication list for this problem includes:    Lipitor 10 Mg Tabs (Atorvastatin calcium) .Marland Kitchen... Take 1 tablet by mouth once a day  Other Orders: EKG w/ Interpretation (93000)  Patient Instructions: 1)  Your physician recommends that you schedule a follow-up appointment in: APRIL 11TH 2011 @10 :30AM WITH DR. Andee Lineman. 2)  Your physician recommends that you continue on your current medications as directed. Please refer to the Current Medication list given to you today.

## 2011-01-18 NOTE — Procedures (Signed)
Summary: Holter and Event/ [facility]  Holter and Event/ [facility]   Imported By: Dorise Hiss 11/16/2010 14:32:44  _____________________________________________________________________  External Attachment:    Type:   Image     Comment:   External Document  Appended Document: Holter and Event/ [facility] Left message to return call.   Appended Document: Holter and Event/ [facility] Patient notified.    Appended Document: Holter and Event/ [facility] normal sinus rhythm no significant arrhythmias

## 2011-01-29 ENCOUNTER — Encounter: Payer: Self-pay | Admitting: Cardiology

## 2011-01-29 ENCOUNTER — Ambulatory Visit (INDEPENDENT_AMBULATORY_CARE_PROVIDER_SITE_OTHER): Payer: Medicare Other | Admitting: Cardiology

## 2011-01-29 DIAGNOSIS — I4891 Unspecified atrial fibrillation: Secondary | ICD-10-CM

## 2011-02-07 NOTE — Letter (Signed)
Summary: Discharge Summary/ [facility]  Discharge Summary/ [facility]   Imported By: Dorise Hiss 01/29/2011 11:37:51  _____________________________________________________________________  External Attachment:    Type:   Image     Comment:   External Document

## 2011-02-07 NOTE — Consult Note (Signed)
Summary: Consultation Report/ CARDIOLOGY  Consultation Report/ CARDIOLOGY   Imported By: Dorise Hiss 01/29/2011 11:46:35  _____________________________________________________________________  External Attachment:    Type:   Image     Comment:   External Document

## 2011-02-07 NOTE — Medication Information (Signed)
Summary: [facility] D/C MEDICATION SHEET  [facility] D/C MEDICATION SHEET   Imported By: Zachary George 01/29/2011 09:38:08  _____________________________________________________________________  External Attachment:    Type:   Image     Comment:   External Document

## 2011-02-07 NOTE — Assessment & Plan Note (Signed)
Summary: 3 MO FU PER FEB REMINDER/SRS   Visit Type:  Follow-up Primary Provider:  Sherryll Burger   History of Present Illness: the patient had an echocardiogram performed in January of 2012.  Her ejection fraction was 55 to 60%.  Right ventricle function was normal and there was no significant valvular heart disease. The study was performed and the patient was admitted earlier this year to the hospital because of atypical chest pain.  This was felt to be musculoskeletal in nature.  The patient had a negative Cardiolite study in 2008 and a nondiagnostic stress echocardiogram in 2011.  During this hospitalization she was ruled out for pulmonary embolism.  She has a history of paroxysmal atrial fibrillation but is currently in normal sinus rhythm.  She also has a history of aortic insufficiency.  In addition she has a history of hypertension and pulmonary nodules.  The patient was treated with hydrocodone for chest pain. The patient has a history of paroxysmal atrial fibrillation.  She remains in normal sinus rhythm on propafenone. General Holter monitor done couple months ago which demonstrated normal sinus rhythm her EKG today however demonstrates junctional rhythm with a heart of 61 beats/min.  She said asymptomatic and denies any dizziness or weakness chest pain shortness of breath orthopnea PND.  The patient continues to decline the use of Coumadin.  She also reports tremors which appear to be intention tremors.  She is planning to see a neurologist to make sure she does not have Parkinson's disease.  Preventive Screening-Counseling & Management  Alcohol-Tobacco     Smoking Status: never  Current Medications (verified): 1)  Atenolol 50 Mg Tabs (Atenolol) .... Take 1 Tablet By Mouth Twice A Day 2)  Plavix 75 Mg Tabs (Clopidogrel Bisulfate) .... Take 1 Tablet By Mouth Once A Day 3)  Aspir-Low 81 Mg Tbec (Aspirin) .... Take 1 Tablet By Mouth Once A Day 4)  Propafenone Hcl 150 Mg Tabs (Propafenone Hcl) ....  Take 1 Tablet By Mouth Twice A Day 5)  Vitamin D 1000 Unit Tabs (Cholecalciferol) .... Take 1 Tablet By Mouth Once A Day 6)  Synthroid 88 Mcg Tabs (Levothyroxine Sodium) .... Take 1 Tablet By Mouth Once A Day 7)  Calcium Magnesium 500-100-500 Liqd (Calcium-Magnesium-Vitamin D) .... Take 2 Tablet By Mouth Two Times A Day 8)  Diazepam 5 Mg Tabs (Diazepam) .... Take 1/2 Tablet By Mouth Once A Day As Needed 9)  Spironolactone 25 Mg Tabs (Spironolactone) .... Take 1 Tablet By Mouth Once A Day 10)  Pravastatin Sodium 20 Mg Tabs (Pravastatin Sodium) .... Take 1 Tablet By Mouth Once A Day 11)  Zantac 150 Mg Tabs (Ranitidine Hcl) .... Take 1 Tablet By Mouth Once A Day As Needed 12)  Clorazepate Dipotassium 3.75 Mg Tabs (Clorazepate Dipotassium) .... Take 1 Tablet By Mouth Once A Day As Needed  Allergies (verified): 1)  ! Pcn  Comments:  Nurse/Medical Assistant: The patient's medication list and allergies were reviewed with the patient and were updated in the Medication and Allergy Lists.  Past History:  Past Medical History: Last updated: 02/14/2010 ABNORMAL CHEST X-RAY (ICD-793.1) PALPITATIONS (ICD-785.1) HYPERLIPIDEMIA-MIXED (ICD-272.4) Hypothyroidism paroxysmal intrafibrillation the patient declines Coumadin therapy  Past Surgical History: Last updated: 10/03/2009 Abdominal Hysterectomy-Total Tonsillectomy  Family History: Last updated: 10/03/2009 Family History of CVA or Stroke:  Family History of Diabetes:   Social History: Last updated: 10/03/2009 Retired  Widowed  Tobacco Use - No.  Alcohol Use - no  Risk Factors: Smoking Status: never (01/29/2011)  Review  of Systems       The patient complains of fatigue and depression.  The patient denies malaise, fever, weight gain/loss, vision loss, decreased hearing, hoarseness, chest pain, palpitations, shortness of breath, prolonged cough, wheezing, sleep apnea, coughing up blood, abdominal pain, blood in stool, nausea,  vomiting, diarrhea, heartburn, incontinence, blood in urine, muscle weakness, joint pain, leg swelling, rash, skin lesions, headache, fainting, dizziness, anxiety, enlarged lymph nodes, easy bruising or bleeding, and environmental allergies.    Vital Signs:  Patient profile:   77 year old female Height:      66 inches Weight:      138 pounds BMI:     22.35 Pulse rate:   57 / minute BP sitting:   136 / 67  (left arm) Cuff size:   regular  Vitals Entered By: Carlye Grippe (January 29, 2011 1:57 PM)  Physical Exam  Additional Exam:  General: Well-developed, well-nourished in no distress head: Normocephalic and atraumatic eyes PERRLA/EOMI intact, conjunctiva and lids normal nose: No deformity or lesions mouth normal dentition, normal posterior pharynx neck: Supple, no JVD.  No masses, thyromegaly or abnormal cervical nodes lungs: Normal breath sounds bilaterally without wheezing.  Normal percussion heart: regular rate and rhythm with normal S1 and S2, no S3 or S4.  PMI is normal.  No pathological murmurs abdomen: Normal bowel sounds, abdomen is soft and nontender without masses, organomegaly or hernias noted.  No hepatosplenomegaly musculoskeletal: Back normal, normal gait muscle strength and tone normal pulsus: Pulse is normal in all 4 extremities Extremities: No peripheral pitting edema neurologic: Alert and oriented x 3 skin: Intact without lesions or rashes cervical nodes: No significant adenopathy psychologic: Normal affect    EKG  Procedure date:  01/29/2011  Findings:      junctional rhythm.  Septal infarct of age undetermined.  Heart rate 61 beats/min.  Impression & Recommendations:  Problem # 1:  CHEST PAIN (ICD-786.50) patient was recently admitted and ruled out for myocardial infarction.  The patient has no significant coronary artery disease. Her updated medication list for this problem includes:    Atenolol 50 Mg Tabs (Atenolol) .Marland Kitchen... Take 1 tablet by mouth  twice a day    Plavix 75 Mg Tabs (Clopidogrel bisulfate) .Marland Kitchen... Take 1 tablet by mouth once a day    Aspir-low 81 Mg Tbec (Aspirin) .Marland Kitchen... Take 1 tablet by mouth once a day  Problem # 2:  ESSENTIAL HYPERTENSION, BENIGN (ICD-401.1) blood pressure is under good control. Her updated medication list for this problem includes:    Atenolol 50 Mg Tabs (Atenolol) .Marland Kitchen... Take 1 tablet by mouth twice a day    Aspir-low 81 Mg Tbec (Aspirin) .Marland Kitchen... Take 1 tablet by mouth once a day    Spironolactone 25 Mg Tabs (Spironolactone) .Marland Kitchen... Take 1 tablet by mouth once a day  Problem # 3:  ATRIAL FIBRILLATION, PAROXYSMAL (ICD-427.31) the patient has a history of paroxysmal atrial fibrillation.  She had a Holter monitor done couple months ago which showed normal sinus rhythm however her EKG from today shows a junctional rhythm.  The patient however stable with this rhythm and we will defer from cutting back on her atenolol.  Of note is that the patient continues to decline to take Coumadin for atrial fibrillation and stroke prophylaxis. Her updated medication list for this problem includes:    Atenolol 50 Mg Tabs (Atenolol) .Marland Kitchen... Take 1 tablet by mouth twice a day    Plavix 75 Mg Tabs (Clopidogrel bisulfate) .Marland Kitchen... Take 1 tablet  by mouth once a day    Aspir-low 81 Mg Tbec (Aspirin) .Marland Kitchen... Take 1 tablet by mouth once a day    Propafenone Hcl 150 Mg Tabs (Propafenone hcl) .Marland Kitchen... Take 1 tablet by mouth twice a day  Orders: EKG w/ Interpretation (93000)  Patient Instructions: 1)  Your physician recommends that you continue on your current medications as directed. Please refer to the Current Medication list given to you today. 2)  Follow up in  6 months

## 2011-02-28 ENCOUNTER — Ambulatory Visit (INDEPENDENT_AMBULATORY_CARE_PROVIDER_SITE_OTHER): Payer: Medicare Other | Admitting: Internal Medicine

## 2011-02-28 DIAGNOSIS — Z7982 Long term (current) use of aspirin: Secondary | ICD-10-CM

## 2011-02-28 DIAGNOSIS — R1310 Dysphagia, unspecified: Secondary | ICD-10-CM

## 2011-02-28 DIAGNOSIS — Z7901 Long term (current) use of anticoagulants: Secondary | ICD-10-CM

## 2011-03-04 LAB — LIPID PANEL
Cholesterol: 140 mg/dL (ref 0–200)
HDL: 55 mg/dL (ref >39)
LDL Cholesterol: 65 mg/dL (ref 0–99)
Total CHOL/HDL Ratio: 2.5 RATIO

## 2011-03-04 LAB — BASIC METABOLIC PANEL
CO2: 33 mEq/L — ABNORMAL HIGH (ref 19–32)
CO2: 37 mEq/L — ABNORMAL HIGH (ref 19–32)
Calcium: 9.5 mg/dL (ref 8.4–10.5)
Chloride: 95 mEq/L — ABNORMAL LOW (ref 96–112)
Chloride: 94 mEq/L — ABNORMAL LOW (ref 96–112)
Creatinine, Ser: 0.78 mg/dL (ref 0.4–1.2)
GFR calc Af Amer: >60 mL/min (ref >60)
GFR calc Af Amer: >60 mL/min (ref >60)
GFR calc non Af Amer: >60 mL/min (ref >60)
Potassium: 3.7 mEq/L (ref 3.5–5.1)
Potassium: 2.7 mEq/L — CL (ref 3.5–5.1)
Sodium: 134 mEq/L — ABNORMAL LOW (ref 135–145)
Sodium: 137 mEq/L (ref 135–145)

## 2011-03-04 LAB — CBC
HCT: 33.5 % — ABNORMAL LOW (ref 36.0–46.0)
HCT: 34.4 % — ABNORMAL LOW (ref 36.0–46.0)
Hemoglobin: 12 g/dL (ref 12.0–15.0)
Hemoglobin: 12.3 g/dL (ref 12.0–15.0)
MCHC: 34.6 g/dL (ref 30.0–36.0)
MCHC: 34.8 g/dL (ref 30.0–36.0)
MCHC: 35.2 g/dL (ref 30.0–36.0)
MCV: 94.1 fL (ref 78.0–100.0)
Platelets: 195 10*3/uL (ref 150–400)
Platelets: 196 10*3/uL (ref 150–400)
RBC: 3.63 MIL/uL — ABNORMAL LOW (ref 3.87–5.11)
RBC: 3.7 MIL/uL — ABNORMAL LOW (ref 3.87–5.11)
RDW: 13.7 % (ref 11.5–15.5)
RDW: 14.1 % (ref 11.5–15.5)
WBC: 4.3 10*3/uL (ref 4.0–10.5)
WBC: 4.8 10*3/uL (ref 4.0–10.5)
WBC: 5.4 10*3/uL (ref 4.0–10.5)

## 2011-03-04 LAB — TSH: TSH: 0.168 u[IU]/mL — ABNORMAL LOW (ref 0.350–4.500)

## 2011-03-04 LAB — HEMOGLOBIN A1C: Hgb A1c MFr Bld: 5.6 % (ref <5.7)

## 2011-03-04 LAB — PROTIME-INR
INR: 1.12 (ref 0.00–1.49)
Prothrombin Time: 14.4 seconds (ref 11.6–15.2)

## 2011-03-04 LAB — CK TOTAL AND CKMB (NOT AT ARMC)
Relative Index: INVALID (ref 0.0–2.5)
Total CK: 70 U/L (ref 7–177)

## 2011-03-04 LAB — CARDIAC PANEL(CRET KIN+CKTOT+MB+TROPI)
Total CK: 57 U/L (ref 7–177)
Total CK: 108 U/L (ref 7–177)
Troponin I: 0.01 ng/mL (ref 0.00–0.06)
Troponin I: 0.01 ng/mL (ref 0.00–0.06)

## 2011-03-04 LAB — POCT CARDIAC MARKERS: Myoglobin, poc: 65 ng/mL (ref 12–200)

## 2011-03-04 LAB — DIFFERENTIAL
Basophils Absolute: 0 10*3/uL (ref 0.0–0.1)
Basophils Relative: 0 % (ref 0–1)
Monocytes Absolute: 0.5 10*3/uL (ref 0.1–1.0)
Neutro Abs: 2.7 10*3/uL (ref 1.7–7.7)

## 2011-03-04 LAB — TROPONIN I: Troponin I: 0.02 ng/mL (ref 0.00–0.06)

## 2011-03-04 LAB — APTT: aPTT: 34 seconds (ref 24–37)

## 2011-03-07 LAB — POCT I-STAT, CHEM 8
BUN: 8 mg/dL (ref 6–23)
Creatinine, Ser: 1 mg/dL (ref 0.4–1.2)
Glucose, Bld: 106 mg/dL — ABNORMAL HIGH (ref 70–99)
Sodium: 140 mEq/L (ref 135–145)
TCO2: 28 mmol/L (ref 0–100)

## 2011-04-04 ENCOUNTER — Ambulatory Visit (HOSPITAL_COMMUNITY)
Admission: RE | Admit: 2011-04-04 | Discharge: 2011-04-04 | Disposition: A | Discharge status: Home / Self Care | Payer: Medicare Other | Source: Ambulatory Visit | Attending: Internal Medicine | Admitting: Internal Medicine

## 2011-04-04 ENCOUNTER — Encounter (HOSPITAL_BASED_OUTPATIENT_CLINIC_OR_DEPARTMENT_OTHER): Payer: Medicare Other | Admitting: Internal Medicine

## 2011-04-04 DIAGNOSIS — Z7982 Long term (current) use of aspirin: Secondary | ICD-10-CM | POA: Insufficient documentation

## 2011-04-04 DIAGNOSIS — Z79899 Other long term (current) drug therapy: Secondary | ICD-10-CM | POA: Insufficient documentation

## 2011-04-04 DIAGNOSIS — K228 Other specified diseases of esophagus: Secondary | ICD-10-CM | POA: Insufficient documentation

## 2011-04-04 DIAGNOSIS — R1310 Dysphagia, unspecified: Secondary | ICD-10-CM

## 2011-04-04 DIAGNOSIS — I10 Essential (primary) hypertension: Secondary | ICD-10-CM | POA: Insufficient documentation

## 2011-04-04 DIAGNOSIS — E785 Hyperlipidemia, unspecified: Secondary | ICD-10-CM | POA: Insufficient documentation

## 2011-04-04 DIAGNOSIS — K2289 Other specified disease of esophagus: Secondary | ICD-10-CM | POA: Insufficient documentation

## 2011-04-04 DIAGNOSIS — K222 Esophageal obstruction: Secondary | ICD-10-CM

## 2011-04-22 NOTE — Op Note (Signed)
Jasmine Torres, Jasmine Torres                  ACCOUNT NO.:  [id number]  MEDICAL RECORD NO.:  [id number]           PATIENT TYPE:  O  LOCATION:  [facility]                          FACILITY:  [facility]  PHYSICIAN:  Lionel December, M.D.    DATE OF BIRTH:  01-Dec-1925  DATE OF PROCEDURE:  04/04/2011 DATE OF DISCHARGE:                              OPERATIVE REPORT   PROCEDURE:  Esophagogastroduodenoscopy with esophageal dilation.  INDICATION:  Jasmine Torres is an 77 year old Caucasian female who has chronic GERD, maintained on a PPI who experienced an episode of food impaction while she was eating barbecue and had to undergo Heimlich maneuver to get relief.  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 Procedures were reviewed with the patient.  Informed consent was obtained.  MEDS FOR CONSCIOUS SEDATION:  Cetacaine spray for oropharyngeal topical anesthesia, Demerol 50 mg IV, Versed 4 mg IV.  FINDINGS:  Procedure performed in endoscopy suite.  The patient's vital signs and O2 sat were monitored during the procedure and remained stable.  The patient was placed in left lateral recumbent position and Pentax videoscope was passed through oropharynx without any difficulty into esophagus.  Esophagus.  Mucosa of the esophagus normal.  There was focal erythema and noncritical narrowing at GE junction.  No ring or stricture was apparent.  GE junction was located at 39 cm from the incisors.  Stomach.  It was empty and distended very well by insufflation.  Folds of proximal stomach are normal.  In the antrum, there are few erosions, but no ulcer crater was noted.  Pyloric channel was patent.  Angularis, fundus, and cardia were examined by retroflexion of scope and were normal.  Duodenum.  Bulbar mucosa was normal.  Scope was passed to second part of duodenal mucosa and folds were normal.  Endoscope was withdrawn.  Esophageal dilation was  performed by passing 54-French Maloney dilator to full insertion without any difficulty.  No resistance was noted as the dilator was passed.  As the dilator was withdrawn, endoscope was passed again.  There was mucosal disruption to the cervical esophagus just below EUS, suggestive of disrupted esophageal web.  There was also short mucosal disruption at GE junction and just above that.  Pictures taken for the record.  Endoscope was withdrawn.  The patient tolerated the procedure well.  FINAL DIAGNOSES: 1. Noncritical narrowing at GE junction with changes of esophagitis. 2. Antral erosions, possibly due to aspirin.  Please note that she has     been treated for H. pylori infection. 3. Esophageal dilation resulted not only in mucosal disruption at GE     junction but also has cervical esophagus indicative of esophageal     web as well.  RECOMMENDATIONS: 1. She will continue antireflux measures. 2. She must chew her food thoroughly before she tends to swallow. 3. Discontinue Zantac. 4. I will start her on pantoprazole 40 mg p.o. q.a.m. prescription     given for 30 with 11 refills.  The patient advised to take her     Plavix in the  evening since she takes PPI in the morning and she     can start on the evening of April 05, 2011.  The patient advised to give [place] a call in 1 week with progress report.     Lionel December, M.D.     NR/MEDQ  D:  04/04/2011  T:  04/04/2011  Job:  [id number]  cc:   Dr. Sherril Croon  Electronically Signed by Lionel December M.D. on 04/22/2011 09:20:09 PM

## 2011-05-01 NOTE — Assessment & Plan Note (Signed)
[facility]                          [facility] OFFICE NOTE   NAME:Busbin, CHENA CHOHAN                         MRN:          [id number]  DATE:12/07/2008                            DOB:          06-24-1925    HISTORY OF PRESENT ILLNESS:  The patient is an 77 year old female with  history paroxysmal atrial fibrillation on Rythmol therapy.  The patient  has occasional palpitations, particularly at night.  She was recently in  the emergency room when she had palpitations.  There was no definite  documentation of recurrent atrial fibrillation.  CardioNet monitor has  been ordered.  During exam; however, the patient does have quite a bit  of irregular heartbeats and may be even short runs of atrial  fibrillation.  I told her in the past that she should be on Coumadin,  but has been very reluctant to do so and prefers a combination of  aspirin and Plavix.  Again, we discussed this today, and she is  reconsidering this and states that during the next visit, she will think  about this and may go on Coumadin.  She is also interested and if she  has recurrent atrial fibrillation to consider radiofrequency ablation.   MEDICATIONS:  1. Propafenone 150 mg p.o. b.i.d.  2. __________  3. Vitamin D.  4. Plavix 75 mg p.o. daily.  5. Calcium.  6. Synthroid 88 mcg p.o. daily.  7. Aspirin 81 mg daily.  8. Zocor 20 mg p.o. nightly.  9. Atenolol 50 mg in the morning and 25 mg in the evening.   PHYSICAL EXAMINATION:  VITAL SIGNS:  Blood pressure 138/64, heart rate  68, weight 134 pounds.  NECK:  Normal carotid upstroke.  No carotid bruits.  LUNGS:  Clear breath sounds bilaterally.  HEART:  Regular rate and rhythm.  Normal S1, S2.  No murmurs, rubs, or  gallops.  Occasional extrasystoles.  ABDOMEN:  Soft.  EXTREMITY:  No cyanosis, clubbing, or edema.  NEUROLOGIC:  The patient alert and oriented, and grossly nonfocal.   PROBLEMS:  1. Tachy palpitations.      a.      Rule out recurrent paroxysmal atrial fibrillation.      b.     Rule out premature ventricular contractions.  2. The patient declines Coumadin therapy in the past.  3. Abnormal chest x-ray, need to follow up.  4. Hypothyroidism on Synthroid.  5. Dyslipidemia.   PLAN:  1. The patient is considering radiofrequency ablation if she has      recurrent atrial fibrillation as she is quite symptomatic with her      palpitations.  We will wait for the result of the event monitor and      see her back in 6 weeks.  2. In the interim, I recommended her to increase her atenolol to 50 mg      in the morning and 50 mg in the evening.  3. The patient can follow up with [place] in 6 weeks at which point we will      repeat a chest x-ray.  Learta Codding, MD,FACC  Electronically Signed    GED/MedQ  DD: 12/07/2008  DT: 12/08/2008  Job #: [id number]

## 2011-05-01 NOTE — Assessment & Plan Note (Signed)
[facility]                          [facility]   NAME:Jasmine Torres, Jasmine Torres                         MRN:          [id number]  DATE:08/31/2008                            DOB:          Jan 29, 1925    CARDIOLOGIST:  Learta Codding, MD,FACC   PRIMARY CARE PHYSICIAN:  Kirstie Peri, MD   REASON FOR VISIT:  Post ER follow-up.   HISTORY OF PRESENT ILLNESS:  Jasmine Torres is an 77 year old female patient  with a history of paroxysmal atrial fibrillation on Rythmol therapy, as  well as a history of palpitations.  She refuses Coumadin and is only  taking aspirin.  She is also on Plavix.  I saw her back in early July  secondary to palpitations.  The patient had a recent monitor that showed  no significant dysrhythmias.  She did have frequent PACs noted.  An  echocardiogram done at Dr. Margaretmary Eddy office in August 2009, revealed normal  LV size and function and mild aortic insufficiency.  She recently went  to the emergency room in late July with recurrent palpitations.  Dr.  Sherryll Burger contacted the office after that visit and spoke with Dr. Myrtis Ser.  The  patient was brought in today for follow-up.  The patient notes that she  has had palpitations for many years.  She had worsening of her symptoms  after she went through menopause.  She was followed by Dr. Myrtis Ser for some  time and eventually another cardiologist in [place].  She was  eventually placed on Rythmol and her symptoms have been fairly quiescent  since that time.  She does have occasional palpitations.  This is fairly  typical for her.  She has not noted any real significant change.  The  episode that brought her to the emergency room on July 08, 2008, was  worse than what she has had in quite some time.  However, since that  date she has not had recurrence of significant palpitations.  She feels  like she is back to baseline.  Over the last couple of days, she has had  no palpitations whatsoever.  She tells  me that this is a usual pattern  for her without significant change.  She denies chest pain, shortness of  breath, or syncope.  She denies orthopnea, PND.  She does have  occasional ankle edema.  She has taken Bumex over the years on a p.r.n.  basis for edema.   CURRENT MEDICATIONS:  Atenolol 50 mg half tablet b.i.d., Propafenone 150  mg b.i.d., Climara 0.05 mg patch q. Week, Vitamin D, Plavix 75 mg daily,  Calcium plus magnesium with vitamin D,  Synthroid 88 mcg daily, Aspirin 81 mg daily, Zocor 20 mg nightly.   PHYSICAL EXAMINATION:  GENERAL:  She is a well-nourished, well-developed  female in no acute distress.  VITAL SIGNS:  Blood pressure is 154/80, pulse 58, weight 131.6 pounds.  HEENT:  Normal.  NECK:  Without JVD.  CARDIAC:  S1 and S2.  Regular rate and rhythm without murmur.  LUNGS:  Clear to auscultation bilaterally.  ABDOMEN:  Soft  and nontender.  EXTREMITIES:  Without edema.  SKIN:  Warm and dry.  NEUROLOGIC:  She is alert and oriented x3.  Cranial nerves II through  XII grossly intact.   ASSESSMENT AND PLAN:  1. Palpitations in an 77 year old female patient with a history of      paroxysmal atrial fibrillation, controlled on Rythmol therapy.  She      is on Plavix and aspirin therapy.  She has refused Coumadin in the      past.  Her symptoms of palpitations, overall, are stable.  I      reviewed this with her thoroughly.  I also reviewed this with Dr.      Andee Lineman.  At this point in time, we do not feel that further      monitoring is warranted.  There was a question of whether or not to      implant a loop recorder.  At this point in time, this does not seem      to be warranted.  We have recommended that the patient take an      extra half of an atenolol (25 mg) on days when she is having      worsening tachy palpitations.  I suspect that she is mainly      experiencing symptomatic PACs.  She noticed continued symptoms when      she had an electrocardiogram in the  emergency room.  This shows      sinus rhythm with PACs.  2. Edema.  She has no edema on exam today, but occasionally has ankle      edema.  She has taken Bumex in the past for this.  She has a      prescription that is from 2005.  I refilled this for her today.      This is 1 mg and she is to take half a tablet daily p.r.n.  We will      check a BMET and a magnesium level.  She knows to call us if she is      taking this medication more than on p.r.n. basis.  3. Hypothyroidism.  She continues on Synthroid.  She had a normal TSH      in March 2009.  4. Dyslipidemia.  She will continue on Zocor.  This is followed by Dr.      Sherryll Burger.   DISPOSITION:  The patient will be brought back in followup with Dr.  Andee Lineman in next 3 months or sooner p.r.n.      Tereso Newcomer, PA-C  Electronically Signed      Learta Codding, MD,FACC  Electronically Signed   SW/MedQ  DD: 08/31/2008  DT: 09/01/2008  Job #: [id number]   cc:   Kirstie Peri, MD

## 2011-05-01 NOTE — Assessment & Plan Note (Signed)
[facility]                          [facility] NOTE   NAME:Jasmine Torres, Jasmine Torres                         MRN:          [id number]  DATE:01/20/2009                            DOB:          1925-12-12    PRESENT ILLNESS:  The patient is an 77 year old female with a history of  paroxysmal atrial fibrillation on Rythmol therapy.  The patient however  is now stable.  She reports rare palpitations.  She reports no chest  pain.  She reports no dyspnea, orthopnea, or PND.  She had recent  cardiac monitor done by Dr. Sherryll Burger, but we do not have results and we will  try to obtain those.  The patient had several x-rays that demonstrated  advanced interstitial lung disease consistent with COPD, but other x-  rays do not confirm this.  There are really erratic readings on the  chest x-rays.  I told the patient that unlikely that she has an advanced  COPD based on physical examination and her functional status.  We will  obtain PFTs to confirm this.   MEDICATIONS:  1. Propafenone 150 mg p.o. daily.  2. Humira 0.05 mg __________weekly patch.  3. Vitamin D.  4. Plavix 75 mg p.o. daily.  5. Calcium.  6. Synthroid 88 mcg p.o. daily.  7. Aspirin 81 mg p.o. daily.  8. Atenolol 50 mg p.o. b.i.d.  9. Simvastatin 20 mg nightly.   PHYSICAL EXAMINATION:  VITAL SIGNS:  Blood pressure 148/68, heart rate  62, weight 137.  NECK:  No carotid upstroke.  No carotid bruits.  LUNGS:  Clear breath sounds bilaterally.  HEART:  Regular rate and rhythm.  Normal S1, S2.  No murmurs, rubs, or  gallops.  ABDOMEN:  Soft, nontender.  No rebound or guarding.  Good bowel sounds.  EXTREMITY:  No cyanosis, clubbing, or edema.   PROBLEMS:  1. Tachy palpitations, stable.  2. The patient declines Coumadin therapy.  3. Abnormal chest x-ray, rule out chronic obstructive pulmonary      disease.  4. Hypothyroidism.  5. Dyslipidemia.   PLAN:  1. The patient has no evidence of recurrent  atrial fibrillation.  We      will obtain a CardioNet monitor from Dr. Sherryll Burger.  2. The patient still disinclined to go on Coumadin and she wants to      continue aspirin and Plavix.  3. Chest x-ray showed the resolution of the infiltrate, but now there      is a note of advanced interstitial lung disease.  We will obtain      pulmonary function tests with DLCO.     Learta Codding, MD,FACC  Electronically Signed    GED/MedQ  DD: 01/20/2009  DT: 01/21/2009  Job #: [id number]   cc:   Kirstie Peri, MD

## 2011-05-01 NOTE — H&P (Signed)
Jasmine Torres, Jasmine Torres                  ACCOUNT NO.:  [id number]  MEDICAL RECORD NO.:  [id number]           PATIENT TYPE:  O  LOCATION:  [facility]                          FACILITY:  [facility]  PHYSICIAN:  Lionel December, M.D.    DATE OF BIRTH:  1925-04-28  DATE OF ADMISSION:  04/04/2011 DATE OF DISCHARGE:  04/18/2012LH                             HISTORY & PHYSICAL   PRESENTING COMPLAINT:  Dysphagia.  HISTORY OF PRESENT ILLNESS:  Jasmine Torres is an 77 year old Caucasian female who is referred through courtesy of Dr. Sherril Croon, for GI evaluation. She presents with recent onset of dysphagia.  She has chronic GERD and maintained on PPI and she states her heartburn is well controlled with therapy.  She had her esophagus dilated by me back in November 2003, and she was felt to have esophageal ring as well as a web.  She states she has done well recently until she was at a restaurant and developed bolus impaction and choked on a barbecue.  She was given Heimlich maneuver by another person in restaurant with relief of her symptoms.  She states she has a good appetite.  She denies weight loss, abdominal pain, melena, or rectal bleeding.  CURRENT MEDICATIONS: 1. Zantac 150 mg p.o. daily. 2. Asa 81 mg daily. 3. Calcium/magnesium 1 tablet daily. 4. Vitamin D 1000 units daily. 5. Atenolol 50 mg daily. 6. Pravastatin 20 mg daily. 7. Plavix 75 mg p.o. daily. 8. Clorazepate 3.75 mg t.i.d. as needed. 9. Spironolactone 25 mg p.o. daily. 10.Synthroid 88 mcg daily. 11.Propafenone 15 mg p.o. daily.  PAST MEDICAL HISTORY:  She has hypertension, hyperlipidemia, recent history of TIA.  She believes she has recovered from except her speech is still slightly abnormal, hypothyroidism, history of atrial fibrillation.  She has had appendectomy, tonsillectomy, and she has had BSO with hysterectomy and she has had few skin cancers removed.  ALLERGIES:  To PENICILLIN.  FAMILY HISTORY:  One sister died of breast  CA.  SOCIAL HISTORY:  She is widowed.  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  She never smoked cigarettes or drank alcohol.  OBJECTIVE:  VITAL SIGNS:  Weight 138 pounds, she is 66 inches tall, pulse 72 per minute, blood pressure 104/62, and temp is 98.4. HEENT:  Conjunctivae is pink.  Sclerae nonicteric.  Oropharyngeal mucosa is normal.  She has own teeth in lower jaw and she has upper dentures. No neck masses or thyromegaly noted. CARDIAC:  With regular rhythm.  Normal S1 and S2.  No murmur or gallop noted. LUNGS:  Clear to auscultation. ABDOMEN:  Symmetrical, soft, and nontender.  No organomegaly or masses.  ASSESSMENT:  The patient is an 77 year old Caucasian female who has chronic gastroesophageal reflux disease, doing well with therapy who has been experiencing solid food dysphagia lately.  She recently had an episode relieved with Heimlich maneuver.  She has history of Schatzki's ring as well as esophageal dilation.  PLAN:  We will schedule her for esophagogastroduodenoscopy and esophageal dilation.  She can continue her aspirin, but we will need to hold off her Plavix for 5 days.  Procedures were reviewed  with the patient and she is agreeable.                                           ______________________________ Lionel December, M.D.     NR/MEDQ  D:  04/30/2011  T:  04/30/2011  Job:  [id number]  Electronically Signed by Lionel December M.D. on 05/01/2011 09:51:42 AM

## 2011-05-01 NOTE — Assessment & Plan Note (Signed)
[facility]                          [facility] NOTE   NAME:Jasmine Torres, Jasmine Torres                         MRN:          [id number]  DATE:06/17/2008                            DOB:          1925/06/13    CARDIOLOGIST:  Learta Codding, MD, [facility]   PRIMARY CARE PHYSICIAN:  Kirstie Peri, MD   REASON FOR VISIT:  A 2-year followup.   HISTORY OF PRESENT ILLNESS:  Jasmine Torres is an 77 year old female patient  with a history of paroxysmal atrial fibrillation on Rythmol therapy who  presents to the office today for followup.  We have not seen her since  May 2007.  At that time, she was seen by Roxanne Mins and was doing  well.  The patient has a history of refusing Coumadin and she is on  Plavix and aspirin therapy.  She was seen recently at Dr. Margaretmary Eddy office.  She complained of a recurrence of palpitations.  She was placed on a 14-  day event monitor.  We are still awaiting those results.  The patient  tells me that her symptoms of palpitations have been fairly stable since  she has been on Rythmol over the years.  Overall, she has not noted a  great increase in her symptoms.  She does feel slightly short of breath  sometimes with her palpitations.  She denies any chest discomfort.  She  denies orthopnea, PND, or pedal edema.  She denies any syncope.  She did  have some shoulder pain recently after she did some weed-eating in the  yard.  This seemed to be musculoskeletal type pain and resolved over a  couple of days.  She is quite active and she describes NYHA class I-II  symptoms.   CURRENT MEDICATIONS:  1. Atenolol 50 mg half tablet b.i.d.  2. Propafenone 150 mg b.i.d.  3. Climara 0.05 mg every week.  4. Vitamin D 1000 international units daily.  5. Plavix 75 mg daily.  6. Calcium plus magnesium with vitamin D 4 tablets daily.  7. Simvastatin 20 mg daily.  8. Synthroid 88 mcg daily  9. Aspirin 81 mg daily.   ALLERGIES:  PENICILLIN causes a  rash.   SOCIAL HISTORY:  She denies tobacco abuse.   REVIEW OF SYSTEMS:  Please see HPI.  Denies fevers, chills, cough,  melena, hematochezia.  The rest of the systems are negative.   PHYSICAL EXAMINATION:  GENERAL:  She is a well-nourished, well-developed  female.  VITAL SIGNS:  She has a blood pressure of 117/64, pulse 54, weight 133  pounds.  HEENT:  Normal.  NECK:  Without JVD.  CARDIAC:  Normal S1, S2.  Regular rate and rhythm without murmur.  LUNGS:  Clear to auscultation bilaterally, soft, nontender.  EXTREMITIES:  No edema.  Calves are nontender.  SKIN:  Warm and dry.  NEUROLOGIC:  She is alert and oriented x3.  Cranial nerves II through  XII grossly intact.  VASCULAR EXAM:  No carotid bruits noted bilaterally.   Electrocardiogram reveals sinus bradycardia heard at 56, normal axis, no  acute changes.  DATABASE:  Echocardiogram performed at Dr. Margaretmary Eddy office June 07, 2008,  revealed an EF of 60%, mild mitral regurgitation, mild aortic  insufficiency.   IMPRESSION:  1. Paroxysmal atrial fibrillation.      a.     Rythmol therapy.      b.     The patient refused Coumadin - Plavix and aspirin therapy.  2. Hypothyroidism, treated.  3. Hyperlipidemia, treated.  4. Gastroesophageal reflux disease.  5. History of transient ischemic attack.  6. History of anxiety.  7. History of tonsillectomy.  8. History of hysterectomy and bilateral salpingo-oophorectomy.   PLAN:  1. The patient presents for followup.  Overall, she is stable.  The      patient did have a recent event monitor.  If she is having a great      deal of paroxysmal atrial fibrillation, then she probably needs a      different type of drug than propafenone.  If she is maintaining      normal sinus rhythm the majority of the time, then she probably      would be able to stay on propafenone long term.  We will try to      obtain the results of the event monitor.  2. The patient continues to refuse Coumadin.   She would rather stay on      Plavix and aspirin long term for now.  3. Of note, the patient had hypertension listed as one of her medical      problems.  This is not the case.  She denies ever having a history      of hypertension.  4. Recent blood work done at Dr. Margaretmary Eddy office revealed a creatinine      of 1, LDL of 91, TSH of 3.23, hemoglobin of 12.9.  5. The patient will be brought back in 3 months for followup.  At that      time, we will be able to review her monitor with her to make      further recommendations.      Tereso Newcomer, PA-C  Electronically Signed      Learta Codding, MD,FACC  Electronically Signed   SW/MedQ  DD: 06/17/2008  DT: 06/18/2008  Job #: [id number]   cc:   Kirstie Peri, MD

## 2011-05-04 NOTE — Consult Note (Signed)
NAME:  Jasmine Torres, Lax NO.:  [id number]   MEDICAL RECORD NO.:  [id number]                  PATIENT TYPE:   LOCATION:                                       FACILITY:   PHYSICIAN:  Lionel December, M.D.                 DATE OF BIRTH:  December 03, 1925   DATE OF CONSULTATION:  10/16/2002  DATE OF DISCHARGE:                                   CONSULTATION   REASON FOR CONSULTATION:  Abdominal pain, H. pylori.   HISTORY OF PRESENT ILLNESS:  The patient presents today for further  evaluation of progressive abdominal pain.  She has also been having some  nausea but no vomiting.  This has been going on for the past several months.  Remotely she was on Protonix 40 mg daily for similar symptoms.  She seemed  to do well with this.  She eventually discontinued use and was placed on  Gaviscon.  She took this for several months before these symptoms returned.  Now she was given Protonix again and has been on it for about a week.  She  noted no improvement of her symptoms.  Her H. pylori serologies were drawn  and were positive.  She was started on Prilosec 20 mg b.i.d., Biaxin, and  amoxicillin.  She started this four days ago.  She has noted some  improvement of her symptoms.  She also complains of burning up and down her  esophagus when she drinks cold liquids.  Denies any typical heartburn  symptoms.  She has dysphagia to solid foods.  Denies any constipation,  diarrhea, melena, or rectal bleeding.  Complains of epigastric pain.  Denies  any NSAID or aspirin use.   CURRENT MEDICATIONS:  1. Prilosec 20 mg b.i.d.  2. Biaxin 500 mg b.i.d.  3. Amoxicillin 500 mg b.i.d.  4. Tenormin 50 mg q.d.  5. Rhythmol 150 mg b.i.d.  6. Plavix 75 mg q.d.  7. Synthroid 5 mg q.d.  8. Calcium 600 plus D two tablets daily.  9. Climara patch change one per week.   ALLERGIES:  PENICILLIN causes swelling.   PAST MEDICAL HISTORY:  1. The patient had a colonoscopy in January 2003 by  Dr. Cleotis Nipper.  She was     found to have scattered diverticula in the sigmoid colon, small internal     hemorrhoids.  2. Hypothyroidism.  3. History of atrial fibrillation.  4. H. pylori infection.  5. Shingles in April 2003.  6. Hormone replacement therapy.  7. Hysterectomy.  8. Tonsillectomy.  9. Bone removed from roof of mouth.   FAMILY HISTORY:  Mother died of diabetes mellitus complications at age 24.  Father died of a stroke at 68.  She has multiple brothers and sisters who  have died of heart disease and/or MIs.  She had a brother who has had peptic  ulcer disease; he drinks alcohol.  Denies any family history of colorectal  cancer.   SOCIAL HISTORY:  She is widowed; she has one daughter.  She is retired from  [employer].  She has never been a smoker.  Denies alcohol use.   REVIEW OF SYSTEMS:  Please see HPI for GI.  GENERAL:  Denies any weight  loss. CARDIOPULMONARY:  Denies any chest pain or shortness of breath.   PHYSICAL EXAMINATION:  VITAL SIGNS:  Weight 135.5, height 5 feet 6 inches.  Temperature 96.5, blood pressure 140/72, pulse 60.  GENERAL:  Very pleasant, well-nourished, well-developed Caucasian female in  no acute distress.  SKIN:  Warm and dry, no jaundice.  HEENT:  Pupils equal, round, and reactive to light.  Conjunctivae are pink,  sclerae nonicteric.  Oropharyngeal mucosa moist and pink; no lesions,  erythema, or exudate.  No lymphadenopathy, thyromegaly, or carotid bruits.  CHEST:  Lungs are clear to auscultation.  CARDIAC:  Reveals regular rate and rhythm, normal S1, S2.  No murmurs, rubs,  or gallops.  ABDOMEN:  Positive bowel sounds, soft, nondistended, mild epigastric  tenderness.  No organomegaly or masses.  No rebound tenderness or guarding.  EXTREMITIES:  No edema.   IMPRESSION:  The patient is a pleasant 77 year old lady who has had  progressive nausea, epigastric pain, and esophageal burning over the last  several months.  She was  found to have positive H. pylori serologies.  She  is currently on therapy.  She also complains of dysphagia and may have an  esophageal web, ring, or stricture.  Given the constellation of symptoms,  the fact that she is on Plavix, I have recommended EGD to further evaluate  for possible peptic ulcer disease or complicated GERD.  I discussed the  risks, alternatives, and benefits of EGD with dilatation and she is  agreeable to proceed.   PLAN:  1. EGD plus or minus dilatation.  2. She will hold Plavix for three days prior to procedures.  3. She will continue amoxicillin, Biaxin, and Prilosec for a total of two     weeks of therapy.  Given PENICILLIN allergy, I have informed her to     discontinue medication if she develops any allergic reactions.  She seems     to be tolerating therapy well at this time.   I would like to thank Dr. Doyne Keel for allowing [place] to take part in the care  of this patient.      Tana Coast, P.A.                        Lionel December, M.D.    LL/MEDQ  D:  10/16/2002  T:  10/16/2002  Job:  [id number]   cc:   Dr. Doyne Keel

## 2011-05-04 NOTE — Op Note (Signed)
NAME:  Jasmine Torres, Jasmine Torres                            ACCOUNT NO.:  [id number]   MEDICAL RECORD NO.:  [id number]                   PATIENT TYPE:  AMB   LOCATION:  DAY                                  FACILITY:  [facility]   PHYSICIAN:  Lionel December, M.D.                 DATE OF BIRTH:  September 21, 1925   DATE OF PROCEDURE:  11/04/2002  DATE OF DISCHARGE:                                 OPERATIVE REPORT   PROCEDURE:  Esophagogastroduodenoscopy with esophageal dilatation.   INDICATIONS:  The patient is a 77 year old Caucasian female with a history  of epigastric pain, nausea, and dysphagia.  She was recently treated for H.  pylori gastritis and feels some better.  Her dysphagia is primarily to  solids.  She is undergoing a diagnostic and therapeutic procedure.  The  procedure was reviewed with the patient, and informed consent was obtained.   PREMEDICATION:  Cetacaine spray for pharyngeal topical anesthesia, Demerol  25 mg IV, Versed 5 mg IV in divided dose.   INSTRUMENT USED:  Olympus video system.   FINDINGS:  Procedure performed in endoscopy suite.  The patient's vital  signs and O2 saturation were monitored during the procedure and remained  stable.  The patient was placed in the left lateral recumbent position, and  the endoscope was passed through the oropharynx without any difficulty into  esophagus.   Esophagus:  Mucosa of the esophagus was normal throughout.  She had an  incomplete ring at distal esophagus just above the EG junction.  No definite  ring was noted at GE junction.  No hernia was noted.   Stomach:  It was empty and distended very well with insufflation.  Folds of  proximal stomach are normal.  Examination of the mucosa revealed antral  erythema but no erosions or ulcers were noted.  Pyloric channel was patent.  Angularis and fundus examined by retroflexing the scope and were normal.   Duodenum:  The bulb and second part of the duodenum were normal.  The  endoscope was  withdrawn.   Esophageal dilatation was performed by passing 54 French Maloney dilator  through the esophagus without any resistance.  Also passed a 56 [place]  Maloney dilator.  As the dilatation was completed, esophagus re-examined.  There was a small posterior tear at the cervical esophagus, employing a web,  and a linear tear at the distal esophagus extending to the GE junction.   The endoscope was withdrawn.  The patient tolerated the procedure well.   FINAL DIAGNOSIS:  1. Antral erythema, which may be residual from Helicobacter pylori     gastritis, which has been recently treated.  2. Distal esophageal ring, status post esophageal web.  Esophageal     dilatation resulted in disruption of cervical and distal esophagus as     above.   RECOMMENDATIONS:  She will continue antireflux measures and Prilosec OTC 20  mg  q.d.  She will resume her usual medications, including Plavix.  I would  like to see her back in a month to make sure all of her symptoms have  resolved.                                               Lionel December, M.D.    NR/MEDQ  D:  11/04/2002  T:  11/04/2002  Job:  [id number]   cc:   Vicie Mutters, M.D.

## 2011-10-16 ENCOUNTER — Encounter: Payer: Self-pay | Admitting: Cardiology

## 2011-10-17 ENCOUNTER — Ambulatory Visit (INDEPENDENT_AMBULATORY_CARE_PROVIDER_SITE_OTHER): Payer: Medicare Other | Admitting: Cardiology

## 2011-10-17 ENCOUNTER — Encounter: Payer: Self-pay | Admitting: Cardiology

## 2011-10-17 VITALS — BP 123/75 | HR 57 | Ht 66.0 in | Wt 131.0 lb

## 2011-10-17 DIAGNOSIS — I4891 Unspecified atrial fibrillation: Secondary | ICD-10-CM

## 2011-10-17 DIAGNOSIS — I10 Essential (primary) hypertension: Secondary | ICD-10-CM

## 2011-10-17 DIAGNOSIS — E876 Hypokalemia: Secondary | ICD-10-CM

## 2011-10-17 NOTE — Assessment & Plan Note (Addendum)
Potassium level 3.9 on Aldactone, back in May 2012, followed by Dr. Sherryll Burger.

## 2011-10-17 NOTE — Patient Instructions (Signed)
   Stop Propafenone Your physician wants you to follow up in: 6 months.  You will receive a reminder letter in the mail one-two months in advance.  If you don't receive a letter, please call our office to schedule the follow up appointment

## 2011-10-17 NOTE — Progress Notes (Signed)
HPI: Patient presents for scheduled six-month followup.  She denies any interim development of exertional CP, SOB, or exacerbation of her baseline level of occasional palpitations. She has occasional dizziness, suggestive of BPV. She denies any recent falls or frank loss of consciousness.  Patient is currently in atrial flutter with CVR, unbeknownst to her.   PMH: reviewed and listed in Problem List in electronic Records (and see below)  Allergies/SH/FH: available in Electronic Records for review  Current Outpatient Prescriptions on File Prior to Visit  Medication Sig Dispense Refill  . aspirin EC 81 MG tablet Take 81 mg by mouth daily.        Marland Kitchen atenolol (TENORMIN) 50 MG tablet Take 50 mg by mouth 2 (two) times daily.        . Calcium-Magnesium-Vitamin D (CALCIUM MAGNESIUM PO) 2 tablets 2 (two) times daily.        . cholecalciferol (VITAMIN D) 1000 UNITS tablet Take 1,000 Units by mouth daily.        . clopidogrel (PLAVIX) 75 MG tablet Take 75 mg by mouth daily.        . clorazepate (TRANXENE) 3.75 MG tablet Take 3.75 mg by mouth daily as needed.        Marland Kitchen levothyroxine (SYNTHROID, LEVOTHROID) 88 MCG tablet Take 88 mcg by mouth daily.        . pravastatin (PRAVACHOL) 20 MG tablet Take 20 mg by mouth daily.        . propafenone (RYTHMOL) 150 MG tablet Take 150 mg by mouth 2 (two) times daily.        Marland Kitchen spironolactone (ALDACTONE) 25 MG tablet Take 25 mg by mouth daily.          ROS: Negative for exertional chest pain, DOE, orthopnea, PND, lower extremity edema, presyncope/syncope, claudication, reflux, hematuria, hematochezia, or melena. Remaining systems reviewed, and are negative.   PHYSICAL EXAM:  BP 123/75  Pulse 57  Ht 5\' 6"  (1.676 m)  Wt 131 lb (59.421 kg)  BMI 21.14 kg/m2  GENERAL:  77 year old female, sitting upright; NAD HEENT: NCAT, PERRLA, EOMI; sclera clear; no xanthelasma NECK: palpable bilateral carotid pulses, no bruits; no JVD; no TM LUNGS: CTA bilaterally CARDIAC:  RRR (S1, S2); no significant murmurs; no rubs or gallops ABDOMEN: soft, non-tender; intact BS EXTREMETIES: intact distal pulses; no significant peripheral edema SKIN: warm/dry; no obvious rash/lesions MUSCULOSKELETAL: no joint deformity NEURO: no focal deficit; NL affect   EKG: reviewed and available in Electronic Records    ASSESSMENT & PLAN:

## 2011-10-17 NOTE — Progress Notes (Signed)
Patient was seen and examined today. I agree with plan as outlined above. Patient appears to be in atrial flutter in permanent atrial fibrillation. I discussed with her that we could stop flecainide. The patient is asymptomatic with this rhythm. Alvin Critchley Gent,MD

## 2011-10-17 NOTE — Assessment & Plan Note (Addendum)
Currently in atrial flutter with CVR, on atenolol and Rythmol. Patient also on ASA/Plavix combination regimen, with history of declining Coumadin anticoagulation. After conferring with Dr. Andee Lineman, recommendation is to discontinue Rythmol, and to continue atenolol for rate control.

## 2011-10-17 NOTE — Assessment & Plan Note (Signed)
Relatively stable on current regimen, with occasional systolic readings in 140-160 range. Chronic SB precludes up titration of atenolol. Would defer to Dr. Sherryll Burger, regarding further adjustment of antihypertensive medication regimen.

## 2011-10-18 ENCOUNTER — Telehealth: Payer: Self-pay | Admitting: *Deleted

## 2011-10-18 NOTE — Telephone Encounter (Signed)
Zachery Dakins (daughter) had questions about office visit.  Questioned why she was taken off of the Propafenone.  Discussed with daughter that med was not effective for her anymore & that she is in permanent atrial fib.  Advised her that she would continue med for rate control.  Daughter question if giving her anything else is an option.

## 2011-10-21 NOTE — Telephone Encounter (Signed)
I think best option for daughter is to call me directly on cellphone to discuss. Best feels good and in permanent atrial fibrialltion, therefore no need to continue a medication that's not effective.

## 2011-10-28 DIAGNOSIS — R079 Chest pain, unspecified: Secondary | ICD-10-CM

## 2011-10-30 NOTE — Telephone Encounter (Signed)
Daughter notified of below & Dr. Andee Lineman cell phone # given.  [phone]).  She verbalized understanding & stated she would probably be calling tomorrow.

## 2011-10-30 NOTE — Telephone Encounter (Signed)
Left message to return call 

## 2011-11-28 ENCOUNTER — Telehealth: Payer: Self-pay | Admitting: Cardiology

## 2011-11-28 NOTE — Telephone Encounter (Signed)
Patient last seen on 10/31.  States she had been back in hospital at [facility] on 11/10.  Now having SOB with exertion - this has worsened since seeing [place] last.  Feels like her heart is pounding really hard sometimes.  Metoprolol tart 50mg  twice a day has been given to her in the meantime.

## 2011-11-30 NOTE — Telephone Encounter (Signed)
We need Holter results. I dont think anything going on, she has been upset since we stopped anitarrhytmic. Needs 21 day Cardionet. Can be added to schedule once we have Shah's Holter results.

## 2011-12-03 NOTE — Telephone Encounter (Addendum)
Cardionet monitor was done x 14 days by Dr. Sherryll Burger.  Results are already scanned into EPIC.

## 2011-12-04 NOTE — Telephone Encounter (Signed)
This is the problem when primary care physicians are doing a monitor for no reason. She has permanent atrial fibrillation which I knew and therefore stopped her anitarrhytmic because it was not doing anything anymore. Her afib is very well rate controlled and permanent. We can schedule her thats fine and we need to go over this again she brings a family member so I don't have to go over this multiple times. Also her symptoms reported on the monitor do not correlate with rapid heart rates.

## 2011-12-05 NOTE — Telephone Encounter (Signed)
Patient notified of below.  OV scheduled for Friday, January 4.  She will try to have daughter with her at visit.

## 2011-12-05 NOTE — Telephone Encounter (Signed)
Left message to return call 

## 2011-12-21 ENCOUNTER — Encounter: Payer: Self-pay | Admitting: Cardiology

## 2011-12-21 ENCOUNTER — Ambulatory Visit (INDEPENDENT_AMBULATORY_CARE_PROVIDER_SITE_OTHER): Payer: Medicare Other | Admitting: Cardiology

## 2011-12-21 VITALS — BP 133/81 | HR 84 | Ht 66.0 in | Wt 134.0 lb

## 2011-12-21 DIAGNOSIS — I4891 Unspecified atrial fibrillation: Secondary | ICD-10-CM

## 2011-12-21 DIAGNOSIS — R222 Localized swelling, mass and lump, trunk: Secondary | ICD-10-CM

## 2011-12-21 DIAGNOSIS — R0789 Other chest pain: Secondary | ICD-10-CM

## 2011-12-21 DIAGNOSIS — Z7901 Long term (current) use of anticoagulants: Secondary | ICD-10-CM

## 2011-12-21 DIAGNOSIS — R29818 Other symptoms and signs involving the nervous system: Secondary | ICD-10-CM

## 2011-12-21 MED ORDER — METOPROLOL TARTRATE 25 MG PO TABS: ORAL_TABLET | ORAL | Status: DC

## 2011-12-21 NOTE — Patient Instructions (Signed)
   Metoprolol 12.5mg  as needed for palpitations - no more than 2 in 24 hours Follow up in  2 months - see above

## 2011-12-31 ENCOUNTER — Encounter: Payer: Self-pay | Admitting: Cardiology

## 2011-12-31 DIAGNOSIS — R0789 Other chest pain: Secondary | ICD-10-CM | POA: Insufficient documentation

## 2011-12-31 DIAGNOSIS — Z7901 Long term (current) use of anticoagulants: Secondary | ICD-10-CM | POA: Insufficient documentation

## 2011-12-31 DIAGNOSIS — R222 Localized swelling, mass and lump, trunk: Secondary | ICD-10-CM | POA: Insufficient documentation

## 2011-12-31 NOTE — Progress Notes (Signed)
Peyton Bottoms, MD, [facility] Board Certified in Adult Cardiovascular Medicine,Internal Medicine and Critical Care Medicine    CC: follow up patient with atrial fibrillation.  HPI:  The patient is an 78 year old female with asymptomatic chronic atrial fibrillation.  Given her last office visit, she was noted to be in atrial fibrillation.  Flecainide was discontinued.  We explained to the patient that the medication was inefficient.  Nevertheless, the patient was very upset about his decision and saw her primary care physician complaining of skipped heartbeats and chest discomfort as well as the heart racing.  She had a CardioNet monitor placed which showed persistent atrial fibrillation and no correlation of her reported symptoms with rapid heart rates.  The fastest heart rate.  The patient had was 120 bpm, and she reported no symptoms.  At other times she reported multiple symptoms including shortness of breath and palpitations when her heart rate is only 70 bpm. She had a prior ischemia workup which was negative for ischemia.  She also declined Coumadin.  The patient is wrong anxious when these episodes occur.  She is being started on Tranxene with improvement in her symptoms. The patient is here with her daughter to discuss the results of the cardiac monitor placed 5 primary care physician.  In the meanwhile, she has also been seen in the hospital in October 2012 with similar symptoms and a CT scan of the chest was done which showed no pulmonary emboli.   PMH: reviewed and listed in Problem List in Electronic Records (and see below) Past Medical History  Diagnosis Date  . Other chest pain     normal stress echocardiogram 2012comment negative CT angiogram for pulmonary emboli  . Palpitations   . Atrial fibrillation     permanent atrial fibrillation by cardiac monitor December 2012, flecainide discontinued.  Patient asymptomatic  . Unspecified hypothyroidism   . Esophageal reflux   .  Anxiety state, unspecified   . Aortic valve disorders     no significant murmur by exam  . Unspecified essential hypertension   . Transient ischemic attack (TIA), and cerebral infarction without residual deficits   . Other malaise and fatigue   . Paraspinal mass     presumed to be benign and unchanged.  Followup by primary care physician  . Chronic anticoagulation     patient declined use of anticoagulation   Past Surgical History  Procedure Date  . Abdominal hysterectomy   . Tonsillectomy     Allergies/SH/FHX : available in Electronic Records for review  Allergies  Allergen Reactions  . Penicillins    History   Social History  . Marital Status: Widowed    Spouse Name: N/A    Number of Children: N/A  . Years of Education: N/A   Occupational History  . RETIRED    Social History Main Topics  . Smoking status: Never Smoker   . Smokeless tobacco: Never Used  . Alcohol Use: No  . Drug Use: No  . Sexually Active: Not on file   Other Topics Concern  . Not on file   Social History Narrative  . No narrative on file   Family History  Problem Relation Age of Onset  . Diabetes      FAMILY HISTORY    Medications: Current Outpatient Prescriptions  Medication Sig Dispense Refill  . aspirin EC 81 MG tablet Take 81 mg by mouth daily.        . Calcium-Magnesium-Vitamin D (CALCIUM MAGNESIUM PO) 2 tablets  2 (two) times daily.        . cholecalciferol (VITAMIN D) 1000 UNITS tablet Take 1,000 Units by mouth daily.        . clopidogrel (PLAVIX) 75 MG tablet Take 75 mg by mouth daily.        . clorazepate (TRANXENE) 3.75 MG tablet Take 3.75 mg by mouth daily as needed.        Marland Kitchen levothyroxine (SYNTHROID, LEVOTHROID) 88 MCG tablet Take 88 mcg by mouth daily.        . metoprolol (LOPRESSOR) 50 MG tablet Take 1 tablet (50 mg total) by mouth 2 (two) times daily.      . pravastatin (PRAVACHOL) 20 MG tablet Take 20 mg by mouth daily.        Marland Kitchen spironolactone (ALDACTONE) 25 MG tablet  Take 25 mg by mouth daily.        . metoprolol tartrate (LOPRESSOR) 25 MG tablet May take 1/2 tab (12.5mg ) as needed for palpitations, no more than 2 in 24 hours.  15 tablet  1    ROS: No nausea or vomiting. No fever or chills.No melena or hematochezia.No bleeding.No claudication  Physical Exam: BP 133/81  Pulse 84  Ht 5\' 6"  (1.676 m)  Wt 134 lb (60.782 kg)  BMI 21.63 kg/m2 General:well-nourished white female in no distress. Neck:upstroke and no carotid bruits.  No thyromegaly.  Normal thyroid.  Normal carotid upstroke Lungs:clear breath sounds bilaterally without wheezing Cardiac:irregular rate and rhythm with normal S1, S2.  No murmur rubs or gallops. Vascular:no edema.  Normal distal pulses bilaterally Skin:warm and dry Physcologic:normal affect  12lead ECG: Limited bedside ECHO:N/A  Counseling was provided regarding the current medical condition and included: . Diagnosis, impressions, prognosis, recommended diagnostic studies  . Risks and benefits of treatment options  . Instructions for management, treatment and/or follow-up care  . Importance of compliance with treatment, risk factor reduction  . Patient and/or family education    Time spent counseling was 45 minutes and recorded in the Problem List.   Patient Active Problem List  Diagnoses  . HYPERLIPIDEMIA-MIXED  . HYPOKALEMIA  . Essential hypertension, benign  . ATRIAL FIBRILLATION, permanent  . PALPITATIONS-rate controlled atrial fibrillation associated with anxiety  . CHEST PAIN  . Nonspecific (abnormal) findings on radiological and other examination of body structure  . ABNORMAL CHEST X-RAY  . Other chest pain-negative dobutamine stress echocardiogram 2012  . Paraspinal mass  . Chronic anticoagulation-declined anticoagulation    PLAN   The patient's symptoms of palpitations and racing of her heart, do not correlate with the findings on cardiac monitor.  In large part to think her symptoms are due to  anxiety.  She has been started on Tranxene with some improvement in her symptoms.  The patient was counseled for a long time on the fact that she does not need flecainide at this point in time as the medication is unable to maintain normal sinus rhythm and is not even helping her with symptom control.  I give the patient prescription for low-dose metoprolol that she can take on a when necessary basis together with her Tranxene.  I suspect this will help significantly with her anxiety.  We broached the issue of anticoagulation again and the patient does not want to be placed on chronic anticoagulation.

## 2012-01-21 ENCOUNTER — Telehealth: Payer: Self-pay | Admitting: Cardiology

## 2012-01-23 NOTE — Telephone Encounter (Signed)
Complain of heart flipping & flopping.  Elevated heart rate, as high as 89.  States this is high for her.  Does have SOB during these episodes.  States she has been taking prn Metoprolol as least daily since last OV.  Did see Dr. Sherril Croon this Monday, 2/4 - did carotid doppler because she had episode on Sunday where she felt like she was going to pass out.  She was questioning that she may of had a stroke, so PMD advised above.  Advised her to cal 911/go to ED for eval if symptoms worsen/syncope.  Does not have follow up scheduled till 3/25.

## 2012-01-24 NOTE — Telephone Encounter (Signed)
Patient notified and verbalized understanding. 

## 2012-01-24 NOTE — Telephone Encounter (Signed)
Increase metoprolol to 75 mg by mouth twice a day. The patient can call on Monday and see how she is doing. A heart rate of 89 beats per minute should not give her any symptoms. I suspect there is a large component of anxiety.

## 2012-01-28 ENCOUNTER — Telehealth: Payer: Self-pay | Admitting: *Deleted

## 2012-01-28 NOTE — Telephone Encounter (Signed)
Patient left message on voice mail for update on how she was doing.  States she is doing fine and feels like she is feeling a little better everyday.  Will continue everything the same for now.  Has OV 3/5.  Will call office if anything changes before then.

## 2012-02-07 ENCOUNTER — Other Ambulatory Visit: Payer: Self-pay | Admitting: *Deleted

## 2012-02-07 MED ORDER — METOPROLOL TARTRATE 50 MG PO TABS: ORAL_TABLET | ORAL | Status: DC

## 2012-02-19 ENCOUNTER — Ambulatory Visit (INDEPENDENT_AMBULATORY_CARE_PROVIDER_SITE_OTHER): Payer: Medicare Other | Admitting: Cardiology

## 2012-02-19 ENCOUNTER — Encounter: Payer: Self-pay | Admitting: Cardiology

## 2012-02-19 VITALS — BP 115/78 | HR 78 | Ht 66.0 in | Wt 133.0 lb

## 2012-02-19 DIAGNOSIS — I4891 Unspecified atrial fibrillation: Secondary | ICD-10-CM

## 2012-02-19 NOTE — Progress Notes (Signed)
Jasmine Bottoms, MD, [facility] Board Certified in Adult Cardiovascular Medicine,Internal Medicine and Critical Care Medicine    CC: Followup patient with atrial fibrillation  HPI:  The patient is a 78 year old female with persistent asymptomatic atrial fibrillation. The patient has been taken off flecainide previously. She did report initially some increasing palpitations. Metoprolol was increased to 75 mg by mouth twice a day. She states after increasing the dose she is feeling much better. Concomitantly she was also placed by primary care physician on Tranxene. Previously she has declined Coumadin. Today we discussed the possibility of considering one of the newer oral anticoagulant therapies. However the patient remained skeptical and I gave her more information for her to review. I also told her that if she goes on an oral anticoagulant we would stop Plavix. The patient otherwise reports no palpitations, presyncope syncope orthopnea or PND.  PMH: reviewed and listed in Problem List in Electronic Records (and see below) Past Medical History  Diagnosis Date  . Other chest pain     normal stress echocardiogram 2012comment negative CT angiogram for pulmonary emboli  . Palpitations   . Atrial fibrillation     permanent atrial fibrillation by cardiac monitor December 2012, flecainide discontinued.  Patient asymptomatic  . Unspecified hypothyroidism   . Esophageal reflux   . Anxiety state, unspecified   . Aortic valve disorders     no significant murmur by exam  . Unspecified essential hypertension   . Transient ischemic attack (TIA), and cerebral infarction without residual deficits   . Other malaise and fatigue   . Paraspinal mass     presumed to be benign and unchanged.  Followup by primary care physician  . Chronic anticoagulation     patient declined use of anticoagulation   Past Surgical History  Procedure Date  . Abdominal hysterectomy   . Tonsillectomy     Allergies/SH/FHX  : available in Electronic Records for review  Allergies  Allergen Reactions  . Penicillins    History   Social History  . Marital Status: Widowed    Spouse Name: N/A    Number of Children: N/A  . Years of Education: N/A   Occupational History  . RETIRED    Social History Main Topics  . Smoking status: Never Smoker   . Smokeless tobacco: Never Used  . Alcohol Use: No  . Drug Use: No  . Sexually Active: Not on file   Other Topics Concern  . Not on file   Social History Narrative  . No narrative on file   Family History  Problem Relation Age of Onset  . Diabetes      FAMILY HISTORY    Medications: Current Outpatient Prescriptions  Medication Sig Dispense Refill  . aspirin EC 81 MG tablet Take 81 mg by mouth daily.        . Calcium-Magnesium-Vitamin D (CALCIUM MAGNESIUM PO) 2 tablets 2 (two) times daily.        . cholecalciferol (VITAMIN D) 1000 UNITS tablet Take 1,000 Units by mouth daily.        . clopidogrel (PLAVIX) 75 MG tablet Take 75 mg by mouth daily.        . clorazepate (TRANXENE) 3.75 MG tablet Take 3.75 mg by mouth daily as needed.        Marland Kitchen levothyroxine (SYNTHROID, LEVOTHROID) 88 MCG tablet Take 88 mcg by mouth daily.        . metoprolol (LOPRESSOR) 50 MG tablet Take 1 1/2 tabs (75mg ) twice  a day  90 tablet  6  . metoprolol tartrate (LOPRESSOR) 25 MG tablet May take 1/2 tab (12.5mg ) as needed for palpitations, no more than 2 in 24 hours.  15 tablet  1  . rosuvastatin (CRESTOR) 5 MG tablet Take 5 mg by mouth daily.      Marland Kitchen spironolactone (ALDACTONE) 25 MG tablet Take 25 mg by mouth daily.          ROS: No nausea or vomiting. No fever or chills.No melena or hematochezia.No bleeding.No claudication  Physical Exam: BP 115/78  Pulse 78  Ht 5\' 6"  (1.676 m)  Wt 133 lb (60.328 kg)  BMI 21.47 kg/m2 General: Well-nourished white female in no distress Neck: Normal carotid upstroke and no carotid bruits. No thyromegaly nonnodular thyroid. JVP is 5-6 cm Lungs:  Clear breath sounds bilaterally Cardiac: Irregular rate and rhythm with normal S1-S2. No murmur rubs or gallops Vascular: No edema. Normal distal pulses bilaterally Skin: Warm and dry Physcologic: Normal affect  12lead ECG: Not performed Limited bedside ECHO:N/A No images are attached to the encounter.    Patient Active Problem List  Diagnoses  . HYPERLIPIDEMIA-MIXED  . HYPOKALEMIA  . Essential hypertension, benign  . ATRIAL FIBRILLATION, persistent-rate controlled   . PALPITATIONS-resolved   . CHEST PAIN-resolved   . Nonspecific (abnormal) findings on radiological and other examination of body structure  . ABNORMAL CHEST X-RAY-followup with primary care physician   . Other chest pain  . Paraspinal mass  . Chronic anticoagulation patient is currently not on anticoagulation she has declined in the past     PLAN   Patient is feeling much better now with good rate control. The Toprol has been increased to 75 mg twice a day.  She continues to take when necessary metoprolol if needed although she has not had to do this.  Patient status post carotid Dopplers recently which were essentially within normal limits with less than 50% stenosis bilaterally.  I gave the patient more information about the newer oral anticoagulants to consider that she would benefit from long-term anticoagulation. She took information material and will review and will get back to me. She has been very hesitant about using Coumadin in the past.

## 2012-02-19 NOTE — Patient Instructions (Signed)
Continue all current medications. Your physician wants you to follow up in: 6 months.  You will receive a reminder letter in the mail one-two months in advance.  If you don't receive a letter, please call our office to schedule the follow up appointment   

## 2012-02-27 DIAGNOSIS — R079 Chest pain, unspecified: Secondary | ICD-10-CM

## 2012-02-28 DIAGNOSIS — I4891 Unspecified atrial fibrillation: Secondary | ICD-10-CM

## 2012-03-03 ENCOUNTER — Other Ambulatory Visit: Payer: Self-pay | Admitting: *Deleted

## 2012-03-03 DIAGNOSIS — R072 Precordial pain: Secondary | ICD-10-CM

## 2012-03-17 ENCOUNTER — Encounter (INDEPENDENT_AMBULATORY_CARE_PROVIDER_SITE_OTHER): Payer: Medicare Other | Admitting: *Deleted

## 2012-03-17 DIAGNOSIS — I4892 Unspecified atrial flutter: Secondary | ICD-10-CM

## 2012-03-17 DIAGNOSIS — R072 Precordial pain: Secondary | ICD-10-CM

## 2012-03-17 DIAGNOSIS — I4891 Unspecified atrial fibrillation: Secondary | ICD-10-CM

## 2012-03-17 DIAGNOSIS — R002 Palpitations: Secondary | ICD-10-CM

## 2012-03-17 NOTE — Progress Notes (Unsigned)
Pt presents to have holter monitor placed per recent hospital discharge orders. Instructions given for use. Pt verbalized understanding.

## 2012-03-26 ENCOUNTER — Encounter: Payer: Self-pay | Admitting: Physician Assistant

## 2012-03-26 ENCOUNTER — Ambulatory Visit (INDEPENDENT_AMBULATORY_CARE_PROVIDER_SITE_OTHER): Payer: Medicare Other | Admitting: Physician Assistant

## 2012-03-26 VITALS — BP 133/79 | HR 64 | Ht 66.0 in | Wt 134.0 lb

## 2012-03-26 DIAGNOSIS — R002 Palpitations: Secondary | ICD-10-CM

## 2012-03-26 DIAGNOSIS — I4891 Unspecified atrial fibrillation: Secondary | ICD-10-CM

## 2012-03-26 DIAGNOSIS — E039 Hypothyroidism, unspecified: Secondary | ICD-10-CM

## 2012-03-26 DIAGNOSIS — R079 Chest pain, unspecified: Secondary | ICD-10-CM

## 2012-03-26 MED ORDER — CLONAZEPAM 0.5 MG PO TABS: 0.2500 mg | ORAL_TABLET | ORAL | Status: DC

## 2012-03-26 NOTE — Assessment & Plan Note (Signed)
Followed by Dr. Sherryll Burger. Patient had repeat TSH level this morning, in followup of recent 0.05 level, during hospitalization.

## 2012-03-26 NOTE — Progress Notes (Signed)
HPI: Patient presents for post hospital followup, from [facility]. She was seen by Dr. Andee Lineman, March 14, in consultation for CP and palpitations. Her chest discomfort clearly followed development of tachycardia palpitations. Serial cardiac markers were normal. Patient remained in AF with CVR. TSH 0.05, on Synthroid. Also complained of facial numbness/headache, and was referred for brain MRI, which was negative. We cleared her for DC on previous home meds, which include ASA 81/Plavix. We also arranged for a 48 hour Holter monitor, with plan to refer her to the EP team, if she demonstrates uncontrolled heart rates. Regarding treatment of amiodarone, he felt she was not a suitable candidate, given that she continued to refuse Coumadin. He did offer the option of starting Xarelto; however, she declined, noting the absence of an antidote. He did add low-dose Klonopin, for her anxiety disorder.   Clinically, she is much improved. She feels much "better", and cites less frequent episodes of palpitations. She is also sleeping better at night. She had followup TSH level earlier this morning, per Dr. Sherryll Burger.  She is leaving this weekend for a few days, and is about to run out of her Klonopin.  Allergies  Allergen Reactions  . Penicillins     Current Outpatient Prescriptions  Medication Sig Dispense Refill  . aspirin EC 81 MG tablet Take 81 mg by mouth daily.        . Calcium-Magnesium-Vitamin D (CALCIUM MAGNESIUM PO) 2 tablets 2 (two) times daily.        . cholecalciferol (VITAMIN D) 1000 UNITS tablet Take 1,000 Units by mouth daily.        . clopidogrel (PLAVIX) 75 MG tablet Take 75 mg by mouth daily.        . clorazepate (TRANXENE) 3.75 MG tablet Take 3.75 mg by mouth daily as needed.        Marland Kitchen levothyroxine (SYNTHROID, LEVOTHROID) 88 MCG tablet Take 88 mcg by mouth daily.        . metoprolol (LOPRESSOR) 50 MG tablet Take 1 1/2 tabs (75mg ) twice a day  90 tablet  6  . metoprolol tartrate (LOPRESSOR) 25 MG  tablet May take 1/2 tab (12.5mg ) as needed for palpitations, no more than 2 in 24 hours.  15 tablet  1  . rosuvastatin (CRESTOR) 5 MG tablet Take 5 mg by mouth daily.      Marland Kitchen spironolactone (ALDACTONE) 25 MG tablet Take 25 mg by mouth daily.          Past Medical History  Diagnosis Date  . Other chest pain     normal stress echocardiogram 2012comment negative CT angiogram for pulmonary emboli  . Palpitations   . Atrial fibrillation     permanent atrial fibrillation by cardiac monitor December 2012, flecainide discontinued.  Patient asymptomatic  . Unspecified hypothyroidism   . Esophageal reflux   . Anxiety state, unspecified   . Aortic valve disorders     no significant murmur by exam  . Unspecified essential hypertension   . Transient ischemic attack (TIA), and cerebral infarction without residual deficits   . Other malaise and fatigue   . Paraspinal mass     presumed to be benign and unchanged.  Followup by primary care physician  . Chronic anticoagulation     patient declined use of anticoagulation    Past Surgical History  Procedure Date  . Abdominal hysterectomy   . Tonsillectomy     History   Social History  . Marital Status: Widowed    Spouse Name:  N/A    Number of Children: N/A  . Years of Education: N/A   Occupational History  . RETIRED    Social History Main Topics  . Smoking status: Never Smoker   . Smokeless tobacco: Never Used  . Alcohol Use: No  . Drug Use: No  . Sexually Active: Not on file   Other Topics Concern  . Not on file   Social History Narrative  . No narrative on file    Family History  Problem Relation Age of Onset  . Diabetes      FAMILY HISTORY    ROS: no nausea, vomiting; no fever, chills; no melena, hematochezia; no claudication  PHYSICAL EXAM: There were no vitals taken for this visit. GENERAL: 78 year old female; NAD HEENT: NCAT, PERRLA, EOMI; sclera clear; no xanthelasma NECK: palpable bilateral carotid pulses, no  bruits; no JVD; no TM LUNGS: CTA bilaterally CARDIAC: Irregularly irregular (S1, S2); no significant murmurs; no rubs or gallops ABDOMEN: soft, non-tender; intact BS EXTREMETIES: intact distal pulses; no significant peripheral edema SKIN: warm/dry; no obvious rash/lesions MUSCULOSKELETAL: no joint deformity NEURO: no focal deficit; NL affect   EKG:    ASSESSMENT & PLAN:

## 2012-03-26 NOTE — Assessment & Plan Note (Addendum)
Much improved, following recent hospitalization. Of note, no cardiac medication adjustments were made; patient was placed on low-dose Klonopin, however. We will renew her prescription for Klonopin, at the same dose, with one refill.

## 2012-03-26 NOTE — Patient Instructions (Signed)
Your physician wants you to follow-up in: 4 months. You will receive a reminder letter in the mail one-two months in advance. If you don't receive a letter, please call our office to schedule the follow-up appointment. Your physician recommends that you continue on your current medications as directed. Please refer to the Current Medication list given to you today. 

## 2012-03-26 NOTE — Assessment & Plan Note (Signed)
No further workup indicated. Recent hospitalization with atypical chest pain, following an episode of tachycardia palpitations, with normal cardiac markers. She has h/o normal LVF, and had a negative dobutamine stress echocardiogram in 2011. She has no known CAD.

## 2012-03-26 NOTE — Assessment & Plan Note (Signed)
The patient now has permanent atrial fibrillation, well-controlled on current medication regimen. Recent post hospital 48-hour Holter monitor was completed, and reviewed by Dr. Andee Lineman. Rates ranged from 27-104, with average 76 bpm. Numerous pauses greater than 2.5 seconds, with the longest 3.3 seconds. Given that there were no pauses greater than 4 seconds in duration, Dr. Andee Lineman found no current indication for permanent pacemaker implantation. Therefore, will continue current medication regimen, including ASA 81/Plavix combination, given history of refusal for Coumadin anticoagulation.

## 2012-07-24 ENCOUNTER — Ambulatory Visit (INDEPENDENT_AMBULATORY_CARE_PROVIDER_SITE_OTHER): Payer: Medicare Other | Admitting: Cardiology

## 2012-07-24 ENCOUNTER — Encounter: Payer: Self-pay | Admitting: Cardiology

## 2012-07-24 VITALS — BP 112/64 | HR 67 | Ht 66.0 in | Wt 138.0 lb

## 2012-07-24 DIAGNOSIS — I10 Essential (primary) hypertension: Secondary | ICD-10-CM

## 2012-07-24 DIAGNOSIS — C4490 Unspecified malignant neoplasm of skin, unspecified: Secondary | ICD-10-CM

## 2012-07-24 DIAGNOSIS — R002 Palpitations: Secondary | ICD-10-CM

## 2012-07-24 DIAGNOSIS — Z7901 Long term (current) use of anticoagulants: Secondary | ICD-10-CM

## 2012-07-24 DIAGNOSIS — E876 Hypokalemia: Secondary | ICD-10-CM

## 2012-07-24 DIAGNOSIS — R143 Flatulence: Secondary | ICD-10-CM

## 2012-07-24 DIAGNOSIS — R140 Abdominal distension (gaseous): Secondary | ICD-10-CM

## 2012-07-24 NOTE — Assessment & Plan Note (Signed)
Blood pressure well controlled continue current medical regimen 

## 2012-07-24 NOTE — Assessment & Plan Note (Signed)
Placed on spironolactone. Followup primary care physician with stable potassium levels reportedly

## 2012-07-24 NOTE — Assessment & Plan Note (Signed)
Patient reports bloating and borborygmi. She has is probably after eating high FODMAP foods. I gave her a list of her low FODMAP diet.

## 2012-07-24 NOTE — Patient Instructions (Addendum)

## 2012-07-24 NOTE — Progress Notes (Signed)
Jasmine Bottoms, MD, [facility] ABIM Board Certified in Adult Cardiovascular Medicine,Internal Medicine and Critical Care Medicine    CC:  History of atrial fibrillation                                                                                HPI:   Patient is an 78 year old female with a history of permanent atrial fibrillation she reports no recurrent palpitations. She had a Holter monitor done several months ago which showed no indication for pacemaker significant pauses heart rate was controlled.     She has a history of atypical chest pain this has resolved. The patient continues to decline Coumadin. She also had recent surgery for skin cancer. Otherwise from a cardiovascular standpoint is stable she reports no chest pain shortness of breath orthopnea PND presyncope or syncope. Anxiety is also much improved.  PMH: reviewed and listed in Problem List in Electronic Records (and see below) Past Medical History  Diagnosis Date  . Other chest pain     normal stress echocardiogram 2012comment negative CT angiogram for pulmonary emboli  . Palpitations   . Atrial fibrillation     permanent atrial fibrillation by cardiac monitor December 2012, flecainide discontinued.  Patient asymptomatic  . Unspecified hypothyroidism   . Esophageal reflux   . Anxiety state, unspecified   . Aortic valve disorders     no significant murmur by exam  . Unspecified essential hypertension   . Transient ischemic attack (TIA), and cerebral infarction without residual deficits   . Other malaise and fatigue   . Paraspinal mass     presumed to be benign and unchanged.  Followup by primary care physician  . Chronic anticoagulation     patient declined use of anticoagulation   Past Surgical History  Procedure Date  . Abdominal hysterectomy   . Tonsillectomy     Allergies/SH/FHX : available in Electronic Records for review  Allergies  Allergen Reactions  . Penicillins    History   Social History  .  Marital Status: Widowed    Spouse Name: N/A    Number of Children: N/A  . Years of Education: N/A   Occupational History  . RETIRED    Social History Main Topics  . Smoking status: Never Smoker   . Smokeless tobacco: Never Used  . Alcohol Use: No  . Drug Use: No  . Sexually Active: Not on file   Other Topics Concern  . Not on file   Social History Narrative  . No narrative on file   Family History  Problem Relation Age of Onset  . Diabetes      FAMILY HISTORY    Medications: Current Outpatient Prescriptions  Medication Sig Dispense Refill  . aspirin EC 81 MG tablet Take 81 mg by mouth daily.        . Calcium-Magnesium-Vitamin D (CALCIUM MAGNESIUM PO) 2 tablets 2 (two) times daily.        . cholecalciferol (VITAMIN D) 1000 UNITS tablet Take 1,000 Units by mouth daily.        . clonazePAM (KLONOPIN) 0.5 MG tablet Take 0.5 tablets (0.25 mg total) by mouth every morning. And 1 tablet at bedtime  45 tablet  1  . clopidogrel (PLAVIX) 75 MG tablet Take 75 mg by mouth daily.        Marland Kitchen levothyroxine (SYNTHROID, LEVOTHROID) 88 MCG tablet Take 88 mcg by mouth daily.        . metoprolol (LOPRESSOR) 100 MG tablet Take 100 mg by mouth 2 (two) times daily.      . Red Yeast Rice 600 MG CAPS Take 1,200 mg by mouth daily.      Marland Kitchen spironolactone (ALDACTONE) 25 MG tablet Take 25 mg by mouth daily.        Marland Kitchen DISCONTD: clonazePAM (KLONOPIN) 0.5 MG tablet Take 0.5 tablets (0.25 mg total) by mouth every morning. And 1 tablet at bedtime  45 tablet  1    ROS: No nausea or vomiting. No fever or chills.No melena or hematochezia.No bleeding.No claudication  Physical Exam: BP 112/64  Pulse 67  Ht 5\' 6"  (1.676 m)  Wt 138 lb (62.596 kg)  BMI 22.27 kg/m2 General:Well-nourished white female in no distress  Neck:Normal carotid upstroke no carotid bruits. No thyromegaly nonnodular thyroid JVP is 6 cm  Lungs:Clear breath sounds bilaterally no wheezing  Cardiac:Irregular rate and rhythm with normal S1-S2  no pathological murmurs  Vascular:No lower extremity edema, mild ankle edema. Normal distal pulses  Skin:Warm and dry  Physcologic:Normal affect  12lead ECG:Not obtained  Limited bedside ECHO:N/A No images are attached to the encounter.   I reviewed and summarized the old records. I reviewed ECG and prior blood work.  Assessment and Plan  Essential hypertension, benign Blood pressure well controlled continue current medical regimen.  Chronic anticoagulation Patient's chronic atrial fibrillation and is a Coumadin candidate. However she declines Coumadin she has been placed on Plavix and reports no bleeding complications.  HYPOKALEMIA Placed on spironolactone. Followup primary care physician with stable potassium levels reportedly  PALPITATIONS No recurrence. The patient had a Holter monitor done several months ago which showed permanent atrial fibrillation but no significant pauses and no indication for pacemaker.  Bloating Patient reports bloating and borborygmi. She has is probably after eating high FODMAP foods. I gave her a list of her low FODMAP diet.      Patient Active Problem List  Diagnosis  . HYPERLIPIDEMIA-MIXED  . HYPOKALEMIA  . Essential hypertension, benign  . ATRIAL FIBRILLATION, PAROXYSMAL  . PALPITATIONS  . Nonspecific (abnormal) findings on radiological and other examination of body structure  . Paraspinal mass  . Chronic anticoagulation  . Hypothyroidism  . Bloating  . Skin cancer

## 2012-07-24 NOTE — Assessment & Plan Note (Signed)
Patient's chronic atrial fibrillation and is a Coumadin candidate. However she declines Coumadin she has been placed on Plavix and reports no bleeding complications.

## 2012-07-24 NOTE — Assessment & Plan Note (Signed)
No recurrence. The patient had a Holter monitor done several months ago which showed permanent atrial fibrillation but no significant pauses and no indication for pacemaker.

## 2012-12-24 ENCOUNTER — Telehealth: Payer: Self-pay | Admitting: Cardiology

## 2012-12-24 NOTE — Telephone Encounter (Signed)
Patient having side affects from medication CLORNAZEPAM. Forgetting things - can't talk with out forgetting what she was [place] say

## 2012-12-24 NOTE — Telephone Encounter (Signed)
Spoke with patient and she stated the clonazepam is making her feel very tired and sleepy. Patient said she thinks its also making her forget things. Nurse advised patient not to take the medication and she should f/u with her PCP about this. Patient states she will be calling her PCP about treating her anxiety since Degent originally prescribed this medication for.

## 2013-02-06 ENCOUNTER — Encounter: Payer: Self-pay | Admitting: Cardiology

## 2013-02-06 ENCOUNTER — Ambulatory Visit (INDEPENDENT_AMBULATORY_CARE_PROVIDER_SITE_OTHER): Payer: Medicare Other | Admitting: Cardiology

## 2013-02-06 VITALS — BP 112/68 | HR 61 | Ht 66.0 in | Wt 136.0 lb

## 2013-02-06 DIAGNOSIS — I10 Essential (primary) hypertension: Secondary | ICD-10-CM

## 2013-02-06 DIAGNOSIS — I4891 Unspecified atrial fibrillation: Secondary | ICD-10-CM

## 2013-02-06 NOTE — Patient Instructions (Addendum)

## 2013-02-06 NOTE — Progress Notes (Signed)
Clinical Summary Ms. Tedesco is an 79 y.o.female presenting for followup. She is a former patient of Dr. Andee Lineman, last seen in August 2013. She states that since her last visit, Dr. Sherryll Burger cut back her Klonopin, also cut her Lopressor dose in half concerned about bradycardia. Ms. Kishbaugh tells me that she has been bradycardic for many years, typical heart rate in the "57s," and has had previous cardiac monitoring related to this. She states that she was not able to tolerate the reduction in Lopressor due to escalating sense of palpitations, and went back to 100 mg twice daily as before. She states she feels better. She remains bradycardic. She has not had any dizziness or syncope. Does report chronic fatigue, also has had some memory problems, symptoms of depression  ECG today shows atrial fibrillation with decreased anteroseptal R waves, heart rate 61.  Echocardiogram from January 2012 showed LVEF 55-60%, normal left atrial chamber size, no significant valvular lesions. Dobutamine echocardiogram in October 2011 demonstrated no diagnostic ST segment changes or wall motion abnormalities at less than 85% MPHR (only 70%).   Allergies  Allergen Reactions  . Penicillins     Current Outpatient Prescriptions  Medication Sig Dispense Refill  . aspirin EC 81 MG tablet Take 81 mg by mouth daily.        . Calcium-Magnesium-Vitamin D (CALCIUM MAGNESIUM PO) 2 tablets 2 (two) times daily.        . cholecalciferol (VITAMIN D) 1000 UNITS tablet Take 1,000 Units by mouth daily.        . clonazePAM (KLONOPIN) 0.5 MG tablet Take 0.5 tablets (0.25 mg total) by mouth every morning. And 1 tablet at bedtime  45 tablet  1  . clopidogrel (PLAVIX) 75 MG tablet Take 75 mg by mouth daily.        Marland Kitchen levothyroxine (SYNTHROID, LEVOTHROID) 88 MCG tablet Take 88 mcg by mouth daily.        . metoprolol (LOPRESSOR) 100 MG tablet Take 100 mg by mouth 2 (two) times daily.      Marland Kitchen spironolactone (ALDACTONE) 25 MG tablet Take 25 mg by  mouth daily.         No current facility-administered medications for this visit.    Past Medical History  Diagnosis Date  . Atrial fibrillation     Paroxysmal - patient declines anticoagulation  . Hypothyroidism   . GERD (gastroesophageal reflux disease)   . Anxiety   . Aortic valve disorders   . Essential hypertension, benign   . History of TIA (transient ischemic attack)   . 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 Pulmonary nodule     Stable 4 mm left upper lobe, benign  . Carotid artery disease     Less than 50% bilaterally 2/13  . Skin cancer     Social History Ms. Hausmann reports that she has never smoked. She has never used smokeless tobacco. Ms. Mihalko reports that she does not drink alcohol.  Review of Systems No chest pain. Stable appetite. Otherwise outlined above.  Physical Examination Filed Vitals:   02/06/13 0829  BP: 112/68  Pulse: 61   Filed Weights   02/06/13 0829  Weight: 136 lb (61.689 kg)   In no acute distress. HEENT: Conjunctiva and lids normal, oropharynx clear. Neck: Supple, no elevated JVP or carotid bruits, no thyromegaly. Lungs: Clear to auscultation, nonlabored breathing at rest. Cardiac: Irregularly irregular, no S3. Abdomen: Soft, nontender, bowel sounds present, no  guarding or rebound. Extremities: No pitting edema, distal pulses 2+.   Problem List and Plan   ATRIAL FIBRILLATION, PAROXYSMAL Patient in atrial fibrillation today, heart rate in the 60s. She has a long-standing history of bradycardia and previous cardiac monitoring related to this. She remains on high-dose Lopressor, was not able to tolerate a reduction in dose by Dr. Sherryll Burger. At this point would recommend continued observation, no change in Lopressor dose for now. As noted previously, she has declined anticoagulant.  Essential hypertension, benign Blood pressure is normal today.    Jonelle Sidle, M.D., F.A.C.C.

## 2013-02-06 NOTE — Assessment & Plan Note (Signed)
Blood pressure is normal today. 

## 2013-02-06 NOTE — Assessment & Plan Note (Signed)
Patient in atrial fibrillation today, heart rate in the 60s. She has a long-standing history of bradycardia and previous cardiac monitoring related to this. She remains on high-dose Lopressor, was not able to tolerate a reduction in dose by Dr. Sherryll Burger. At this point would recommend continued observation, no change in Lopressor dose for now. As noted previously, she has declined anticoagulant.

## 2013-03-04 ENCOUNTER — Encounter: Payer: Self-pay | Admitting: *Deleted

## 2013-03-04 ENCOUNTER — Ambulatory Visit (INDEPENDENT_AMBULATORY_CARE_PROVIDER_SITE_OTHER): Payer: Medicare Other | Admitting: Physician Assistant

## 2013-03-04 ENCOUNTER — Other Ambulatory Visit: Payer: Self-pay | Admitting: Physician Assistant

## 2013-03-04 ENCOUNTER — Telehealth: Payer: Self-pay | Admitting: Physician Assistant

## 2013-03-04 VITALS — BP 114/72 | HR 68 | Ht 66.0 in | Wt 137.0 lb

## 2013-03-04 DIAGNOSIS — I4891 Unspecified atrial fibrillation: Secondary | ICD-10-CM

## 2013-03-04 DIAGNOSIS — R079 Chest pain, unspecified: Secondary | ICD-10-CM

## 2013-03-04 DIAGNOSIS — I10 Essential (primary) hypertension: Secondary | ICD-10-CM

## 2013-03-04 MED ORDER — NITROGLYCERIN 0.4 MG SL SUBL: 0.4000 mg | SUBLINGUAL_TABLET | SUBLINGUAL | Status: DC | PRN

## 2013-03-04 NOTE — Assessment & Plan Note (Signed)
The option of either a repeat stress test versus diagnostic cardiac catheterization were presented to the patient, and she has opted for the latter. She had a very bad experience during the dobutamine stress echocardiogram test in 2011, and does not want to have this repeated. Moreover, she would also like to know with certainty if she has any significant CAD. She has never undergone a cardiac catheterization procedure. The plan is to check a troponin level now and, if negative, proceed with elective cardiac catheterization at the end of this week, with Dr. Shirlee Latch. If, however, troponin is abnormal, then we'll proceed with direct admission to [facility] for further observation and management. Patient is agreeable with this recommendation and risks/benefits of procedure were discussed, in conjunction with Dr. Shirlee Latch. Patient will also be given prescription for NTG.

## 2013-03-04 NOTE — Patient Instructions (Signed)
   Lab for Troponin NOW - lab will hold you there until they call results to [place]  Nitroglycerin as needed for severe chest pain only - new sent to pharm Continue all current medications.  Left heart cath - see info given Follow up given after above

## 2013-03-04 NOTE — Telephone Encounter (Signed)
Pt has MCR and MCR supplement.  No precert required °

## 2013-03-04 NOTE — Progress Notes (Signed)
Primary Cardiologist: Sam McDowell, MD   HPI: Patient referred by Dr. Shah earlier today, for evaluation of chest pain.  Patient has no documented history of CAD. She has history of previous negative stress tests, with both a normal Lexiscan and a negative dobutamine echocardiogram in 2011.  She awoke 3 AM this morning with dull, left-sided CP (6/10), which lasted for approximately one hour. She initially referred to it as "sore", and seemed to be worse with palpation. She denied any exacerbation with deep inspiration or movement of her arms or torso. She also denied any exacerbation with subsequent walking. Her symptoms resolved spontaneously. She denied any similarity to her occasional reflux symptoms.  Of note, she denies any history of exertional chest discomfort.   12 lead ECG in Dr. Shah's office earlier this morning indicates atrial fibrillation at 58 bpm; no ischemic changes  Allergies  Allergen Reactions  . Penicillins     Current Outpatient Prescriptions  Medication Sig Dispense Refill  . aspirin EC 81 MG tablet Take 81 mg by mouth daily.        . Calcium-Magnesium-Vitamin D (CALCIUM MAGNESIUM PO) 2 tablets 2 (two) times daily.        . cholecalciferol (VITAMIN D) 1000 UNITS tablet Take 1,000 Units by mouth daily.        . clonazePAM (KLONOPIN) 0.5 MG tablet Take 0.5 tablets (0.25 mg total) by mouth every morning. And 1 tablet at bedtime  45 tablet  1  . clopidogrel (PLAVIX) 75 MG tablet Take 75 mg by mouth daily.        . levothyroxine (SYNTHROID, LEVOTHROID) 88 MCG tablet Take 88 mcg by mouth daily.        . metoprolol (LOPRESSOR) 100 MG tablet Take 100 mg by mouth 2 (two) times daily.      . spironolactone (ALDACTONE) 25 MG tablet Take 25 mg by mouth daily.         No current facility-administered medications for this visit.    Past Medical History  Diagnosis Date  . Atrial fibrillation     Paroxysmal - patient declines anticoagulation  . Hypothyroidism   . GERD  (gastroesophageal reflux disease)   . Anxiety   . Aortic valve disorders   . Essential hypertension, benign   . History of TIA (transient ischemic attack)   . 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 Pulmonary nodule     Stable 4 mm left upper lobe, benign  . Carotid artery disease     Less than 50% bilaterally 2/13  . Skin cancer     Past Surgical History  Procedure Laterality Date  . Abdominal hysterectomy    . Tonsillectomy      History   Social History  . Marital Status: Widowed    Spouse Name: N/A    Number of Children: N/A  . Years of Education: N/A   Occupational History  . RETIRED    Social History Main Topics  . Smoking status: Never Smoker   . Smokeless tobacco: Never Used  . Alcohol Use: No  . Drug Use: No  . Sexually Active: Not on file   Other Topics Concern  . Not on file   Social History Narrative  . No narrative on file    Family History  Problem Relation Age of Onset  . Diabetes      FAMILY HISTORY    ROS: no nausea, vomiting; no fever, chills; no melena, hematochezia;   no claudication  PHYSICAL EXAM: BP 114/72  Pulse 68  Ht 5' 6" (1.676 m)  Wt 137 lb (62.143 kg)  BMI 22.12 kg/m2 GENERAL: 79-year-old female; NAD HEENT: NCAT, PERRLA, EOMI; sclera clear; no xanthelasma NECK: palpable bilateral carotid pulses, no bruits; no JVD; no TM LUNGS: CTA bilaterally CARDIAC: RRR (S1, S2); no significant murmurs; no rubs or gallops ABDOMEN: soft, non-tender; intact BS EXTREMETIES: no significant peripheral edema SKIN: warm/dry; no obvious rash/lesions MUSCULOSKELETAL: no joint deformity NEURO: no focal deficit; NL affect   EKG: reviewed and available in Electronic Records   ASSESSMENT & PLAN:  Chest pain The option of either a repeat stress test versus diagnostic cardiac catheterization were presented to the patient, and she has opted for the latter. She had a very bad experience during the dobutamine  stress echocardiogram test in 2011, and does not want to have this repeated. Moreover, she would also like to know with certainty if she has any significant CAD. She has never undergone a cardiac catheterization procedure. The plan is to check a troponin level now and, if negative, proceed with elective cardiac catheterization at the end of this week, with Dr. McLean. If, however, troponin is abnormal, then we'll proceed with direct admission to [facility] for further observation and management. Patient is agreeable with this recommendation and risks/benefits of procedure were discussed, in conjunction with Dr. McLean. Patient will also be given prescription for NTG.  ATRIAL FIBRILLATION, PAROXYSMAL Appears to be persistent, adequately rate-controlled on beta blocker therapy. Patient remains on low-dose ASA, having declined anticoagulation therapy in the past.  Essential hypertension, benign Well controlled on current medication regimen, followed by primary MD.    Gene Alf Doyle, PAC  

## 2013-03-04 NOTE — Telephone Encounter (Signed)
Left heart cath scheduled for Friday, 3/21 - 9:00 - Shirlee Latch - [facility], main lab

## 2013-03-04 NOTE — Assessment & Plan Note (Signed)
Appears to be persistent, adequately rate-controlled on beta blocker therapy. Patient remains on low-dose ASA, having declined anticoagulation therapy in the past.

## 2013-03-04 NOTE — Assessment & Plan Note (Signed)
Well controlled on current medication regimen, followed by primary MD.

## 2013-03-05 ENCOUNTER — Encounter (HOSPITAL_COMMUNITY): Payer: Self-pay

## 2013-03-06 ENCOUNTER — Ambulatory Visit (HOSPITAL_COMMUNITY)
Admission: RE | Admit: 2013-03-06 | Discharge: 2013-03-06 | Disposition: A | Discharge status: Home / Self Care | Payer: Medicare Other | Source: Ambulatory Visit | Attending: Cardiology | Admitting: Cardiology

## 2013-03-06 ENCOUNTER — Encounter (HOSPITAL_COMMUNITY)
Admission: RE | Disposition: A | Discharge status: Home / Self Care | Payer: Self-pay | Source: Ambulatory Visit | Attending: Cardiology

## 2013-03-06 DIAGNOSIS — R079 Chest pain, unspecified: Secondary | ICD-10-CM

## 2013-03-06 DIAGNOSIS — I129 Hypertensive chronic kidney disease with stage 1 through stage 4 chronic kidney disease, or unspecified chronic kidney disease: Secondary | ICD-10-CM | POA: Insufficient documentation

## 2013-03-06 DIAGNOSIS — Z79899 Other long term (current) drug therapy: Secondary | ICD-10-CM | POA: Insufficient documentation

## 2013-03-06 DIAGNOSIS — Z85828 Personal history of other malignant neoplasm of skin: Secondary | ICD-10-CM | POA: Insufficient documentation

## 2013-03-06 DIAGNOSIS — I359 Nonrheumatic aortic valve disorder, unspecified: Secondary | ICD-10-CM | POA: Insufficient documentation

## 2013-03-06 DIAGNOSIS — R911 Solitary pulmonary nodule: Secondary | ICD-10-CM | POA: Insufficient documentation

## 2013-03-06 DIAGNOSIS — Z7982 Long term (current) use of aspirin: Secondary | ICD-10-CM | POA: Insufficient documentation

## 2013-03-06 DIAGNOSIS — N189 Chronic kidney disease, unspecified: Secondary | ICD-10-CM | POA: Insufficient documentation

## 2013-03-06 DIAGNOSIS — I2510 Atherosclerotic heart disease of native coronary artery without angina pectoris: Secondary | ICD-10-CM

## 2013-03-06 DIAGNOSIS — Z7902 Long term (current) use of antithrombotics/antiplatelets: Secondary | ICD-10-CM | POA: Insufficient documentation

## 2013-03-06 DIAGNOSIS — K219 Gastro-esophageal reflux disease without esophagitis: Secondary | ICD-10-CM | POA: Insufficient documentation

## 2013-03-06 DIAGNOSIS — Z8673 Personal history of transient ischemic attack (TIA), and cerebral infarction without residual deficits: Secondary | ICD-10-CM | POA: Insufficient documentation

## 2013-03-06 DIAGNOSIS — E039 Hypothyroidism, unspecified: Secondary | ICD-10-CM | POA: Insufficient documentation

## 2013-03-06 DIAGNOSIS — I6529 Occlusion and stenosis of unspecified carotid artery: Secondary | ICD-10-CM | POA: Insufficient documentation

## 2013-03-06 HISTORY — PX: LEFT HEART CATHETERIZATION WITH CORONARY ANGIOGRAM: SHX5451

## 2013-03-06 LAB — BASIC METABOLIC PANEL
Calcium: 9.6 mg/dL (ref 8.4–10.5)
GFR calc Af Amer: 48 mL/min — ABNORMAL LOW (ref >90)
GFR calc non Af Amer: 42 mL/min — ABNORMAL LOW (ref >90)
Potassium: 3.9 mEq/L (ref 3.5–5.1)
Sodium: 139 mEq/L (ref 135–145)

## 2013-03-06 LAB — CBC
Hemoglobin: 13.5 g/dL (ref 12.0–15.0)
Platelets: 178 10*3/uL (ref 150–400)
RBC: 4.33 MIL/uL (ref 3.87–5.11)

## 2013-03-06 LAB — PROTIME-INR
INR: 1.15 (ref 0.00–1.49)
Prothrombin Time: 14.5 seconds (ref 11.6–15.2)

## 2013-03-06 SURGERY — LEFT HEART CATHETERIZATION WITH CORONARY ANGIOGRAM
Anesthesia: LOCAL

## 2013-03-06 MED ORDER — FENTANYL CITRATE 0.05 MG/ML IJ SOLN
INTRAMUSCULAR | Status: AC
  Filled 2013-03-06: qty 2

## 2013-03-06 MED ORDER — SODIUM CHLORIDE 0.9 % IV SOLN
250.0000 mL | INTRAVENOUS | Status: DC | PRN
Start: 2013-03-06 — End: 2013-03-06

## 2013-03-06 MED ORDER — SODIUM CHLORIDE 0.9 % IJ SOLN: 3.0000 mL | Freq: Two times a day (BID) | INTRAMUSCULAR | Status: DC

## 2013-03-06 MED ORDER — LIDOCAINE HCL (PF) 1 % IJ SOLN
INTRAMUSCULAR | Status: AC
  Filled 2013-03-06: qty 30

## 2013-03-06 MED ORDER — ASPIRIN 81 MG PO CHEW
324.0000 mg | CHEWABLE_TABLET | ORAL | Status: AC
  Administered 2013-03-06: 324 mg via ORAL
  Filled 2013-03-06: qty 4

## 2013-03-06 MED ORDER — SODIUM CHLORIDE 0.9 % IJ SOLN: 3.0000 mL | INTRAMUSCULAR | Status: DC | PRN

## 2013-03-06 MED ORDER — SODIUM CHLORIDE 0.9 % IV SOLN
INTRAVENOUS | Status: DC
  Administered 2013-03-06: 1000 mL via INTRAVENOUS

## 2013-03-06 MED ORDER — MIDAZOLAM HCL 2 MG/2ML IJ SOLN
INTRAMUSCULAR | Status: AC
  Filled 2013-03-06: qty 2

## 2013-03-06 MED ORDER — HEPARIN (PORCINE) IN NACL 2-0.9 UNIT/ML-% IJ SOLN
INTRAMUSCULAR | Status: AC
  Filled 2013-03-06: qty 1000

## 2013-03-06 NOTE — Interval H&P Note (Signed)
History and Physical Interval Note:  03/06/2013 9:21 AM  Jasmine Torres  has presented today for surgery, with the diagnosis of CHEST PAIN  The various methods of treatment have been discussed with the patient and family. After consideration of risks, benefits and other options for treatment, the patient has consented to  Procedure(s): LEFT HEART CATHETERIZATION WITH CORONARY ANGIOGRAM (N/A) as a surgical intervention .  The patient's history has been reviewed, patient examined, no change in status, stable for surgery.  I have reviewed the patient's chart and labs.  Questions were answered to the patient's satisfaction.     Saphia Vanderford [employer]

## 2013-03-06 NOTE — H&P (View-Only) (Signed)
Primary Cardiologist: Simona Huh, MD   HPI: Patient referred by Dr. Sherryll Burger earlier today, for evaluation of chest pain.  Patient has no documented history of CAD. She has history of previous negative stress tests, with both a normal Lexiscan and a negative dobutamine echocardiogram in 2011.  She awoke 3 AM this morning with dull, left-sided CP (6/10), which lasted for approximately one hour. She initially referred to it as "sore", and seemed to be worse with palpation. She denied any exacerbation with deep inspiration or movement of her arms or torso. She also denied any exacerbation with subsequent walking. Her symptoms resolved spontaneously. She denied any similarity to her occasional reflux symptoms.  Of note, she denies any history of exertional chest discomfort.   12 lead ECG in Dr. Margaretmary Eddy office earlier this morning indicates atrial fibrillation at 58 bpm; no ischemic changes  Allergies  Allergen Reactions  . Penicillins     Current Outpatient Prescriptions  Medication Sig Dispense Refill  . aspirin EC 81 MG tablet Take 81 mg by mouth daily.        . Calcium-Magnesium-Vitamin D (CALCIUM MAGNESIUM PO) 2 tablets 2 (two) times daily.        . cholecalciferol (VITAMIN D) 1000 UNITS tablet Take 1,000 Units by mouth daily.        . clonazePAM (KLONOPIN) 0.5 MG tablet Take 0.5 tablets (0.25 mg total) by mouth every morning. And 1 tablet at bedtime  45 tablet  1  . clopidogrel (PLAVIX) 75 MG tablet Take 75 mg by mouth daily.        Marland Kitchen levothyroxine (SYNTHROID, LEVOTHROID) 88 MCG tablet Take 88 mcg by mouth daily.        . metoprolol (LOPRESSOR) 100 MG tablet Take 100 mg by mouth 2 (two) times daily.      Marland Kitchen spironolactone (ALDACTONE) 25 MG tablet Take 25 mg by mouth daily.         No current facility-administered medications for this visit.    Past Medical History  Diagnosis Date  . Atrial fibrillation     Paroxysmal - patient declines anticoagulation  . Hypothyroidism   . GERD  (gastroesophageal reflux disease)   . Anxiety   . Aortic valve disorders   . Essential hypertension, benign   . History of TIA (transient ischemic attack)   . 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 Pulmonary nodule     Stable 4 mm left upper lobe, benign  . Carotid artery disease     Less than 50% bilaterally 2/13  . Skin cancer     Past Surgical History  Procedure Laterality Date  . Abdominal hysterectomy    . Tonsillectomy      History   Social History  . Marital Status: Widowed    Spouse Name: N/A    Number of Children: N/A  . Years of Education: N/A   Occupational History  . RETIRED    Social History Main Topics  . Smoking status: Never Smoker   . Smokeless tobacco: Never Used  . Alcohol Use: No  . Drug Use: No  . Sexually Active: Not on file   Other Topics Concern  . Not on file   Social History Narrative  . No narrative on file    Family History  Problem Relation Age of Onset  . Diabetes      FAMILY HISTORY    ROS: no nausea, vomiting; no fever, chills; no melena, hematochezia;  no claudication  PHYSICAL EXAM: BP 114/72  Pulse 68  Ht 5\' 6"  (1.676 m)  Wt 137 lb (62.143 kg)  BMI 22.12 kg/m2 GENERAL: 79 year old female; NAD HEENT: NCAT, PERRLA, EOMI; sclera clear; no xanthelasma NECK: palpable bilateral carotid pulses, no bruits; no JVD; no TM LUNGS: CTA bilaterally CARDIAC: RRR (S1, S2); no significant murmurs; no rubs or gallops ABDOMEN: soft, non-tender; intact BS EXTREMETIES: no significant peripheral edema SKIN: warm/dry; no obvious rash/lesions MUSCULOSKELETAL: no joint deformity NEURO: no focal deficit; NL affect   EKG: reviewed and available in Electronic Records   ASSESSMENT & PLAN:  Chest pain The option of either a repeat stress test versus diagnostic cardiac catheterization were presented to the patient, and she has opted for the latter. She had a very bad experience during the dobutamine  stress echocardiogram test in 2011, and does not want to have this repeated. Moreover, she would also like to know with certainty if she has any significant CAD. She has never undergone a cardiac catheterization procedure. The plan is to check a troponin level now and, if negative, proceed with elective cardiac catheterization at the end of this week, with Dr. Shirlee Latch. If, however, troponin is abnormal, then we'll proceed with direct admission to [facility] for further observation and management. Patient is agreeable with this recommendation and risks/benefits of procedure were discussed, in conjunction with Dr. Shirlee Latch. Patient will also be given prescription for NTG.  ATRIAL FIBRILLATION, PAROXYSMAL Appears to be persistent, adequately rate-controlled on beta blocker therapy. Patient remains on low-dose ASA, having declined anticoagulation therapy in the past.  Essential hypertension, benign Well controlled on current medication regimen, followed by primary MD.    Rozell Searing, PAC

## 2013-03-06 NOTE — CV Procedure (Signed)
   Cardiac Catheterization Procedure Note  Name: Jasmine Torres MRN: [id number] DOB: 10-16-1925  Procedure: Left Heart Cath, Selective Coronary Angiography  Indication: Chest pain at rest, adverse reaction to prior stress testing.    Procedural Details: The right wrist was prepped, draped, and anesthetized with 1% lidocaine. Using the modified Seldinger technique, a 5 French sheath was introduced into the right radial artery. 3 mg of verapamil was administered through the sheath, weight-based unfractionated heparin was administered intravenously. Standard Judkins catheters were used for selective coronary angiography. Catheter exchanges were performed over an exchange length guidewire. There were no immediate procedural complications. A TR band was used for radial hemostasis at the completion of the procedure.  The patient was transferred to the post catheterization recovery area for further monitoring.  Procedural Findings: Hemodynamics: AO 120/69 LV 128/5  Coronary angiography: Coronary dominance: right  Left mainstem: No angiographic disease.   Left anterior descending (LAD): Mild luminal irregularities.  Small D3 with 70% ostial stenosis.   Left circumflex (LCx): No angiographic disease.   Right coronary artery (RCA): No angiographic disease.   Left ventriculography: Not done due to CKD.   30 cc contrast used.   Final Conclusions:  Mild coronary disease.  I do not think that her chest pain at rest comes from the stenosis in the small diagonal.  I suspect her symptoms are noncardiac.  Followup in [place] in 2 wks.   Marca Ancona 03/06/2013, 9:49 AM

## 2013-03-20 ENCOUNTER — Encounter: Payer: Self-pay | Admitting: Physician Assistant

## 2013-03-20 ENCOUNTER — Ambulatory Visit (INDEPENDENT_AMBULATORY_CARE_PROVIDER_SITE_OTHER): Payer: Medicare Other | Admitting: Physician Assistant

## 2013-03-20 VITALS — BP 114/74 | HR 64 | Ht 66.0 in | Wt 136.8 lb

## 2013-03-20 DIAGNOSIS — R079 Chest pain, unspecified: Secondary | ICD-10-CM

## 2013-03-20 DIAGNOSIS — I10 Essential (primary) hypertension: Secondary | ICD-10-CM

## 2013-03-20 DIAGNOSIS — I4891 Unspecified atrial fibrillation: Secondary | ICD-10-CM

## 2013-03-20 NOTE — Progress Notes (Signed)
Primary Cardiologist: Simona Huh, MD   HPI: Followup of recent elective diagnostic cardiac catheterization for further evaluation of CP syndrome, in the context of previous negative stress tests, but no documented CAD.   - Cardiac catheterization, March 21: Mild CAD with 70% ostial DX 3 (small). LVG deferred (secondary to CKD). History of normal LVF, by prior studies.  Patient symptoms deemed to be noncardiac, and that it was unlikely that her symptoms were related to the small DX lesion.  Patient returns today reporting no further CP. In looking back, she now ascribes this to probable indigestion from spicy food she had eaten the night before her nocturnal, persistent episode of CP.  Allergies  Allergen Reactions  . Penicillins Swelling    Current Outpatient Prescriptions  Medication Sig Dispense Refill  . aspirin EC 81 MG tablet Take 81 mg by mouth daily.        . Calcium-Magnesium-Vitamin D (CALCIUM MAGNESIUM PO) Take 2 tablets by mouth 2 (two) times daily.       . cholecalciferol (VITAMIN D) 1000 UNITS tablet Take 1,000 Units by mouth daily.        . clonazePAM (KLONOPIN) 0.5 MG tablet Take 0.25-0.5 mg by mouth 2 (two) times daily. 0.25mg  in the morning and 0.5mg  at bedtime      . clopidogrel (PLAVIX) 75 MG tablet Take 75 mg by mouth daily.        Marland Kitchen dicyclomine (BENTYL) 10 MG capsule Take 10 mg by mouth 2 (two) times daily.      . hydroxypropyl methylcellulose (ISOPTO TEARS) 2.5 % ophthalmic solution Place 1 drop into both eyes 4 (four) times daily as needed (dry eyes).      Marland Kitchen levothyroxine (SYNTHROID, LEVOTHROID) 50 MCG tablet Take 50 mcg by mouth daily.      . metoprolol (LOPRESSOR) 100 MG tablet Take 100 mg by mouth 2 (two) times daily.      Marland Kitchen spironolactone (ALDACTONE) 25 MG tablet Take 25 mg by mouth daily.         No current facility-administered medications for this visit.    Past Medical History  Diagnosis Date  . Atrial fibrillation     Paroxysmal - patient declines  anticoagulation  . Hypothyroidism   . GERD (gastroesophageal reflux disease)   . Anxiety   . Aortic valve disorders   . Essential hypertension, benign   . History of TIA (transient ischemic attack)   . 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 Pulmonary nodule     Stable 4 mm left upper lobe, benign  . Carotid artery disease     Less than 50% bilaterally 2/13  . Skin cancer     Past Surgical History  Procedure Laterality Date  . Abdominal hysterectomy    . Tonsillectomy      History   Social History  . Marital Status: Widowed    Spouse Name: N/A    Number of Children: N/A  . Years of Education: N/A   Occupational History  . RETIRED    Social History Main Topics  . Smoking status: Never Smoker   . Smokeless tobacco: Never Used  . Alcohol Use: No  . Drug Use: No  . Sexually Active: Not on file   Other Topics Concern  . Not on file   Social History Narrative  . No narrative on file    Family History  Problem Relation Age of Onset  . Diabetes  FAMILY HISTORY    ROS: no nausea, vomiting; no fever, chills; no melena, hematochezia; no claudication  PHYSICAL EXAM: BP 114/74  Pulse 64  Ht 5\' 6"  (1.676 m)  Wt 136 lb 12.8 oz (62.052 kg)  BMI 22.09 kg/m2 GENERAL: 79 year old female; NAD  HEENT: NCAT, PERRLA, EOMI; sclera clear; no xanthelasma  NECK: palpable bilateral carotid pulses, no bruits; no JVD; no TM  LUNGS: CTA bilaterally  CARDIAC: RRR (S1, S2); no significant murmurs; no rubs or gallops  ABDOMEN: soft, non-tender; intact BS  EXTREMETIES: no significant peripheral edema  SKIN: warm/dry; no obvious rash/lesions  MUSCULOSKELETAL: no joint deformity  NEURO: no focal deficit; NL affect    EKG:    ASSESSMENT & PLAN:  Chest pain The results of the recent cardiac catheterization were reviewed in full with the patient, with the conclusion that her symptoms were most likely noncardiac in origin. Therefore,  recommendation is to continue current medication regimen with low dose ASA and Lopressor. NTG to be discontinued.  ATRIAL FIBRILLATION, PAROXYSMAL Adequately rate controlled on beta blocker therapy. Patient remains on low-dose ASA, having declined anticoagulation therapy in the past. Of note, she is also on long-standing Plavix, for history of remote TIA.  Essential hypertension, benign Well-controlled on current medication regimen.    Gene Benzion Mesta, PAC

## 2013-03-20 NOTE — Assessment & Plan Note (Addendum)
Adequately rate controlled on beta blocker therapy. Patient remains on low-dose ASA, having declined anticoagulation therapy in the past. Of note, she is also on long-standing Plavix, for history of remote TIA.

## 2013-03-20 NOTE — Assessment & Plan Note (Signed)
The results of the recent cardiac catheterization were reviewed in full with the patient, with the conclusion that her symptoms were most likely noncardiac in origin. Therefore, recommendation is to continue current medication regimen with low dose ASA and Lopressor. NTG to be discontinued.

## 2013-03-20 NOTE — Patient Instructions (Addendum)
   Stop Nitroglycerin Continue all other current medications. Your physician wants you to follow up in: 6 months.  You will receive a reminder letter in the mail one-two months in advance.  If you don't receive a letter, please call our office to schedule the follow up appointment

## 2013-03-20 NOTE — Assessment & Plan Note (Signed)
Well-controlled on current medication regimen 

## 2013-05-01 ENCOUNTER — Encounter (INDEPENDENT_AMBULATORY_CARE_PROVIDER_SITE_OTHER): Payer: Self-pay | Admitting: *Deleted

## 2013-05-08 DIAGNOSIS — R079 Chest pain, unspecified: Secondary | ICD-10-CM

## 2013-05-08 DIAGNOSIS — I4891 Unspecified atrial fibrillation: Secondary | ICD-10-CM

## 2013-05-14 ENCOUNTER — Ambulatory Visit (INDEPENDENT_AMBULATORY_CARE_PROVIDER_SITE_OTHER): Payer: Medicare Other | Admitting: Internal Medicine

## 2013-05-14 ENCOUNTER — Encounter (HOSPITAL_COMMUNITY): Payer: Self-pay | Admitting: Pharmacy Technician

## 2013-05-14 ENCOUNTER — Encounter (INDEPENDENT_AMBULATORY_CARE_PROVIDER_SITE_OTHER): Payer: Self-pay | Admitting: Internal Medicine

## 2013-05-14 ENCOUNTER — Other Ambulatory Visit (INDEPENDENT_AMBULATORY_CARE_PROVIDER_SITE_OTHER): Payer: Self-pay | Admitting: *Deleted

## 2013-05-14 ENCOUNTER — Telehealth (INDEPENDENT_AMBULATORY_CARE_PROVIDER_SITE_OTHER): Payer: Self-pay | Admitting: *Deleted

## 2013-05-14 VITALS — BP 108/84 | HR 42 | Ht 66.0 in | Wt 135.1 lb

## 2013-05-14 DIAGNOSIS — K625 Hemorrhage of anus and rectum: Secondary | ICD-10-CM

## 2013-05-14 DIAGNOSIS — Z1211 Encounter for screening for malignant neoplasm of colon: Secondary | ICD-10-CM

## 2013-05-14 HISTORY — DX: Hemorrhage of anus and rectum: K62.5

## 2013-05-14 MED ORDER — PEG-KCL-NACL-NASULF-NA ASC-C 100 G PO SOLR: 1.0000 | Freq: Once | ORAL | Status: DC

## 2013-05-14 NOTE — Patient Instructions (Addendum)
Colonoscopy with Dr. Rehman. The risks and benefits such as perforation, bleeding, and infection were reviewed with the patient and is agreeable. 

## 2013-05-14 NOTE — Telephone Encounter (Signed)
Patient needs movi prep 

## 2013-05-14 NOTE — Progress Notes (Signed)
Subjective:     Patient ID: Jasmine Torres, female   DOB: October 17, 1925, 79 y.o.   MRN: [id number]  HPI Referred to our office by Dr. Sherryll Burger for a screening colonoscopy. She tells me she woke up one morning with lower abdominal pain.  She says it felt like someone had a steel rod in her rectum. She noticed the blood about a month ago. She says she saw bright red rectal bleeding. She has not have any rectal bleeding since. She thinks her last colonoscopy was about 15-20 yrs ago by Dr. Cleotis Nipper and was normal. Appetite is good. No weight loss. She usually has a BM x 3 a day. Stools are usually loose. No change in stools.     She was at [facility]  this past weekend for an irregular heart. Hx of atrial fib. HR today in office was 42.   On Plavix and ASA Review of Systems see hpi Current Outpatient Prescriptions  Medication Sig Dispense Refill  . aspirin EC 81 MG tablet Take 81 mg by mouth daily.        . Calcium-Magnesium-Vitamin D (CALCIUM MAGNESIUM PO) Take 2 tablets by mouth 2 (two) times daily.       . cholecalciferol (VITAMIN D) 1000 UNITS tablet Take 1,000 Units by mouth daily.        . clonazePAM (KLONOPIN) 0.5 MG tablet Take 0.25-0.5 mg by mouth 2 (two) times daily. 0.25mg  in the morning and 0.5mg  at bedtime      . clopidogrel (PLAVIX) 75 MG tablet Take 75 mg by mouth daily.        . hydroxypropyl methylcellulose (ISOPTO TEARS) 2.5 % ophthalmic solution Place 1 drop into both eyes 4 (four) times daily as needed (dry eyes).      Marland Kitchen levothyroxine (SYNTHROID, LEVOTHROID) 50 MCG tablet Take 50 mcg by mouth daily.      . metoprolol (LOPRESSOR) 100 MG tablet Take 50 mg by mouth 2 (two) times daily.       . pantoprazole (PROTONIX) 40 MG tablet Take 40 mg by mouth daily.      Marland Kitchen spironolactone (ALDACTONE) 25 MG tablet Take 25 mg by mouth daily.         No current facility-administered medications for this visit.   Past Medical History  Diagnosis Date  . Atrial fibrillation     Paroxysmal - patient  declines anticoagulation  . Hypothyroidism   . GERD (gastroesophageal reflux disease)   . Anxiety   . Aortic valve disorders   . Essential hypertension, benign   . History of TIA (transient ischemic attack)   . 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 Pulmonary nodule     Stable 4 mm left upper lobe, benign  . Carotid artery disease     Less than 50% bilaterally 2/13  . Skin cancer    Past Surgical History  Procedure Laterality Date  . Abdominal hysterectomy    . Tonsillectomy     Allergies  Allergen Reactions  . Penicillins Swelling        Objective:   Physical Exam  Filed Vitals:   05/14/13 1017  BP: 108/84  Pulse: 42  Height: 5\' 6"  (1.676 m)  Weight: 135 lb 1.6 oz (61.281 kg)   Alert and oriented. Skin warm and dry. Oral mucosa is moist.   . Sclera anicteric, conjunctivae is pink. Thyroid not enlarged. No cervical lymphadenopathy. Lungs clear. Heart regular rate and rhythm.  Abdomen is soft. Bowel sounds are positive. No hepatomegaly. No abdominal masses felt. No tenderness.  No edema to lower extremities.       Assessment:   Rectal bleeding, resolved. Colonic neoplasm needs to be ruled out.     Plan:   Colonscopy with Dr Karilyn Cota.The risks and benefits such as perforation, bleeding, and infection were reviewed with the patient and is agreeable.

## 2013-05-18 ENCOUNTER — Encounter (HOSPITAL_COMMUNITY): Payer: Medicare Other

## 2013-05-20 ENCOUNTER — Encounter (HOSPITAL_COMMUNITY)
Admission: RE | Disposition: A | Discharge status: Home / Self Care | Payer: Self-pay | Source: Ambulatory Visit | Attending: Internal Medicine

## 2013-05-20 ENCOUNTER — Encounter (HOSPITAL_COMMUNITY): Payer: Self-pay | Admitting: *Deleted

## 2013-05-20 ENCOUNTER — Ambulatory Visit (HOSPITAL_COMMUNITY)
Admission: RE | Admit: 2013-05-20 | Discharge: 2013-05-20 | Disposition: A | Discharge status: Home / Self Care | Payer: Medicare Other | Source: Ambulatory Visit | Attending: Internal Medicine | Admitting: Internal Medicine

## 2013-05-20 DIAGNOSIS — K625 Hemorrhage of anus and rectum: Secondary | ICD-10-CM | POA: Insufficient documentation

## 2013-05-20 DIAGNOSIS — I10 Essential (primary) hypertension: Secondary | ICD-10-CM | POA: Insufficient documentation

## 2013-05-20 DIAGNOSIS — K6389 Other specified diseases of intestine: Secondary | ICD-10-CM

## 2013-05-20 DIAGNOSIS — K644 Residual hemorrhoidal skin tags: Secondary | ICD-10-CM

## 2013-05-20 DIAGNOSIS — R197 Diarrhea, unspecified: Secondary | ICD-10-CM | POA: Insufficient documentation

## 2013-05-20 DIAGNOSIS — K5730 Diverticulosis of large intestine without perforation or abscess without bleeding: Secondary | ICD-10-CM

## 2013-05-20 DIAGNOSIS — K6289 Other specified diseases of anus and rectum: Secondary | ICD-10-CM | POA: Insufficient documentation

## 2013-05-20 HISTORY — PX: COLONOSCOPY: SHX5424

## 2013-05-20 SURGERY — COLONOSCOPY
Anesthesia: Moderate Sedation

## 2013-05-20 MED ORDER — SODIUM CHLORIDE 0.9 % IV SOLN
INTRAVENOUS | Status: DC
Start: 2013-05-20 — End: 2013-05-20
  Administered 2013-05-20: 20 mL/h via INTRAVENOUS

## 2013-05-20 MED ORDER — MEPERIDINE HCL 50 MG/ML IJ SOLN
INTRAMUSCULAR | Status: DC | PRN
  Administered 2013-05-20: 20 mg via INTRAVENOUS
  Administered 2013-05-20: 15 mg via INTRAVENOUS

## 2013-05-20 MED ORDER — MIDAZOLAM HCL 5 MG/5ML IJ SOLN
INTRAMUSCULAR | Status: DC | PRN
  Administered 2013-05-20: 2 mg via INTRAVENOUS
  Administered 2013-05-20 (×2): 1 mg via INTRAVENOUS

## 2013-05-20 MED ORDER — STERILE WATER FOR IRRIGATION IR SOLN
Status: DC | PRN
  Administered 2013-05-20: 14:00:00

## 2013-05-20 MED ORDER — MEPERIDINE HCL 50 MG/ML IJ SOLN
INTRAMUSCULAR | Status: AC
  Filled 2013-05-20: qty 1

## 2013-05-20 MED ORDER — MIDAZOLAM HCL 5 MG/5ML IJ SOLN
INTRAMUSCULAR | Status: AC
  Filled 2013-05-20: qty 10

## 2013-05-20 NOTE — H&P (Signed)
Jasmine Torres is an 79 y.o. female.   Chief Complaint: Patient's here for colonoscopy. HPI: Patient is a 79-year-old Caucasian female who experienced an episode of rectal bleeding but 5 weeks ago. She recalls she had diarrhea pain across the lower abdomen and she also felt like she had iron rod in the rectum. She passed more than tablespoonful of right fibula. She got better over the next couple of days. She does not recall using antibiotic for onset of the symptoms. She has 3-4 bowel movements per day. She believes it is due to magnesium that she takes along with calcium. Family history is negative for colorectal carcinoma. Since last colonoscopy was over 20 years ago.  Past Medical History  Diagnosis Date  . Atrial fibrillation     Paroxysmal - patient declines anticoagulation  . Hypothyroidism   . GERD (gastroesophageal reflux disease)   . Anxiety   . Aortic valve disorders   . Essential hypertension, benign   . History of TIA (transient ischemic attack)   . 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 Pulmonary nodule     Stable 4 mm left upper lobe, benign  . Carotid artery disease     Less than 50% bilaterally 2/13  . Skin cancer   . Hypertension   . Atrial fibrillation   . Hypothyroid     Past Surgical History  Procedure Laterality Date  . Abdominal hysterectomy    . Tonsillectomy    . Abdominal hysterectomy    . Skin cancer removed    . Tonsillectomy    . Cataract extraction bilateral w/ anterior vitrectomy      Family History  Problem Relation Age of Onset  . Diabetes      FAMILY HISTORY   Social History:  reports that she has never smoked. She has never used smokeless tobacco. She reports that she does not drink alcohol or use illicit drugs.  Allergies:  Allergies  Allergen Reactions  . Penicillins Swelling    Medications Prior to Admission  Medication Sig Dispense Refill  . aspirin EC 81 MG tablet Take 81 mg by mouth daily.         . Calcium-Magnesium-Vitamin D (CALCIUM MAGNESIUM PO) Take 2 tablets by mouth 2 (two) times daily.       . cholecalciferol (VITAMIN D) 1000 UNITS tablet Take 1,000 Units by mouth daily.        . clonazePAM (KLONOPIN) 0.5 MG tablet Take 0.25 mg by mouth 2 (two) times daily.       . clopidogrel (PLAVIX) 75 MG tablet Take 75 mg by mouth daily.        . hydroxypropyl methylcellulose (ISOPTO TEARS) 2.5 % ophthalmic solution Place 1 drop into both eyes 4 (four) times daily as needed (dry eyes).      Marland Kitchen levothyroxine (SYNTHROID, LEVOTHROID) 75 MCG tablet Take 75 mcg by mouth daily before breakfast.      . metoprolol (LOPRESSOR) 100 MG tablet Take 50 mg by mouth 2 (two) times daily.       . pantoprazole (PROTONIX) 40 MG tablet Take 40 mg by mouth daily.      . peg 3350 powder (MOVIPREP) 100 G SOLR Take 1 kit (100 g total) by mouth once.  1 kit  0  . spironolactone (ALDACTONE) 25 MG tablet Take 25 mg by mouth daily.         No results found for this or any previous visit (  from the past 48 hour(s)). No results found.  ROS  Blood pressure 146/75, pulse 84, temperature 98.5 F (36.9 C), temperature source Oral, resp. rate 18, height 5\' 6"  (1.676 m), weight 135 lb (61.236 kg), SpO2 97.00%. Physical Exam  Constitutional: She appears well-developed and well-nourished.  HENT:  Mouth/Throat: Oropharynx is clear and moist.  Eyes: Conjunctivae are normal. No scleral icterus.  Neck: No thyromegaly present.  Cardiovascular:   Irregular rhythm normal S1 and S2. No murmur or gallop noted.  Respiratory: Effort normal and breath sounds normal.  GI: Soft. She exhibits no distension and no mass. There is no tenderness.  Musculoskeletal: She exhibits no edema.  Lymphadenopathy:    She has no cervical adenopathy.  Neurological: She is alert.  Skin: Skin is warm and dry.     Assessment/Plan Rectal bleeding. Diagnostic colonoscopy.  Jasmine Torres U 05/20/2013, 2:16 PM

## 2013-05-20 NOTE — Op Note (Signed)
COLONOSCOPY PROCEDURE REPORT  PATIENT:  Jasmine Torres  MR#:  [id number] Birthdate:  1925/11/02, 79 y.o., female Endoscopist:  Dr. Malissa Hippo, MD Referred By:  Dr. Kirstie Peri, MD  Procedure Date: 05/20/2013  Procedure:   Colonoscopy  Indications:  Patient is a 76-year-old Caucasian female who went episode of rectal bleeding 5 weeks ago associated with abdominal rectal pain and diarrhea.  Informed Consent:  The procedure and risks were reviewed with the patient and informed consent was obtained.  Medications:  Demerol 35 mg IV Versed 4 mg IV  Description of procedure:  After a digital rectal exam was performed, that colonoscope was advanced from the anus through the rectum and colon to the area of the cecum, ileocecal valve and appendiceal orifice. The cecum was deeply intubated. These structures were well-seen and photographed for the record. From the level of the cecum and ileocecal valve, the scope was slowly and cautiously withdrawn. The mucosal surfaces were carefully surveyed utilizing scope tip to flexion to facilitate fold flattening as needed. The scope was pulled down into the rectum where a thorough exam including retroflexion was performed. Terminal ileum was also examined.  Findings:   Prep excellent Normal mucosa of terminal ileum. Moderate number of diverticula at sigmoid colon. Normal rectal mucosa. Few small anal papilla and hemorrhoids below the dentate line.   Therapeutic/Diagnostic Maneuvers Performed:  None  Complications:  None  Cecal Withdrawal Time:  8 minutes  Impression:  Normal terminal ileum. Number of diverticula at sigmoid colon. No evidence of polyps or other mucosal abnormalities. Sternal hemorrhoids and small anal papillae.  Recommendations:  Standard instructions given. Patient will resume Aspirin and Clopidogrel as before. No further workup unless bleeding recurrent.  Chantale Leugers U  05/20/2013 2:54 PM  CC: Dr. Kirstie Peri, MD & Dr. Bonnetta Barry  ref. provider found

## 2013-05-22 ENCOUNTER — Other Ambulatory Visit: Payer: Self-pay | Admitting: *Deleted

## 2013-05-22 ENCOUNTER — Encounter (HOSPITAL_COMMUNITY): Payer: Self-pay | Admitting: Internal Medicine

## 2013-05-22 DIAGNOSIS — I4891 Unspecified atrial fibrillation: Secondary | ICD-10-CM

## 2013-05-27 ENCOUNTER — Ambulatory Visit (HOSPITAL_COMMUNITY)
Admission: RE | Admit: 2013-05-27 | Discharge: 2013-05-27 | Disposition: A | Discharge status: Home / Self Care | Payer: Medicare Other | Source: Ambulatory Visit | Attending: Cardiology | Admitting: Cardiology

## 2013-05-27 ENCOUNTER — Encounter: Payer: Medicare Other | Admitting: Physician Assistant

## 2013-05-27 DIAGNOSIS — I4891 Unspecified atrial fibrillation: Secondary | ICD-10-CM | POA: Insufficient documentation

## 2013-05-27 NOTE — Progress Notes (Signed)
*  PRELIMINARY RESULTS* Echocardiogram 48H Holter monitor has been performed.  Jasmine Torres 05/27/2013, 1:43 PM

## 2013-06-02 NOTE — Procedures (Signed)
[name], Jasmine Torres                  ACCOUNT NO.:  [id number]  MEDICAL RECORD NO.:  [id number]  LOCATION:  CARDIOPU                      FACILITY:  [facility]  PHYSICIAN:  Gerrit Friends. Dietrich Pates, MD, FACCDATE OF BIRTH:  [birth year]  DATE OF PROCEDURE:  05/27/2013 DATE OF DISCHARGE:  05/27/2013                               HOLTER MONITOR   REFERRING PHYSICIAN:  Jonelle Sidle, MD  CLINICAL DATA:  An 79 year old gentleman with atrial fibrillation.  1. Continuous electrocardiographic recording was maintained for 47     hours and 21 minutes, during which, the predominant rhythm was     atrial fibrillation with a controlled ventricular response.  No     other arrhythmias were identified. 2. Transient episodes of bradycardia occurred with heart rates     recorded as low as 34, at least for a period of few seconds.  No     sustained heart rates below 40 BPM were identified.  There were     multiple pauses in the range 2.5-2.9 seconds, which were apparently     asymptomatic. 3. No significant ST-segment elevation or depression was seen. 4. A diary of activity was returned with prolonged palpitations     reported on four occasions and chest pain on two lasting hours.     During the former, AF was present with a controlled ventricular     Response.  The patient's usual rhythm was also present during     chest discomfort with no ST-segment deviation seen.  IMPRESSION:  Atrial fibrillation with a somewhat slow ventricular response and multiple fairly prominent, but asymptomatic pauses, none of them lasting as long as 3 seconds.  No apparent correlation between rhythm or other electrocardiographic characteristics and symptoms.   Gerrit Friends. Dietrich Pates, MD, [facility]     RMR/MEDQ  D:  06/02/2013  T:  06/02/2013  Job:  [id number]

## 2013-06-03 ENCOUNTER — Encounter: Payer: Self-pay | Admitting: Physician Assistant

## 2013-06-03 ENCOUNTER — Ambulatory Visit (INDEPENDENT_AMBULATORY_CARE_PROVIDER_SITE_OTHER): Payer: Medicare Other | Admitting: Physician Assistant

## 2013-06-03 VITALS — BP 111/63 | HR 69 | Ht 66.0 in | Wt 136.0 lb

## 2013-06-03 DIAGNOSIS — I10 Essential (primary) hypertension: Secondary | ICD-10-CM

## 2013-06-03 DIAGNOSIS — I4891 Unspecified atrial fibrillation: Secondary | ICD-10-CM

## 2013-06-03 DIAGNOSIS — I482 Chronic atrial fibrillation, unspecified: Secondary | ICD-10-CM | POA: Insufficient documentation

## 2013-06-03 DIAGNOSIS — E039 Hypothyroidism, unspecified: Secondary | ICD-10-CM

## 2013-06-03 NOTE — Assessment & Plan Note (Signed)
On Synthroid, followed by primary M.D. Recent TSH 8.46.

## 2013-06-03 NOTE — Progress Notes (Signed)
Primary Cardiologist: Simona Huh, MD   HPI: Post hospital followup from [facility], following recent brief hospitalization with symptomatic permanent atrial fibrillation with CVR.  We recommended decreasing Lopressor from 100 twice a day to 50 twice a day, given documented bradycardia with rates as low as 42 bpm in early morning. We also recommended further evaluation as an outpatient with a 48 Hour Holter monitor, to assess for heart rate variability. This was just recently completed and reviewed by Dr. Dietrich Pates, who noted evidence of a 2.9 second pause, asymptomatic, with otherwise controlled HR with a mean of 59 bpm.  We also revisited the issue of anticoagulation, but she remained concerned about the risks of being on Coumadin, or even one of the newer agents. She has a CHADS score of 4 (HTN, age, TIA).  She returns today feeling "much better". Specifically, she is experiencing much more energy. She continues to have palpitations, but only on occasion. She describes these as a "thumping" sensation, but also has occasional fluttering, lasting no more than 30 minutes.   Allergies  Allergen Reactions  . Penicillins Swelling    Current Outpatient Prescriptions  Medication Sig Dispense Refill  . aspirin EC 81 MG tablet Take 81 mg by mouth daily.        . Calcium-Magnesium-Vitamin D (CALCIUM MAGNESIUM PO) Take 2 tablets by mouth 2 (two) times daily.       . cholecalciferol (VITAMIN D) 1000 UNITS tablet Take 1,000 Units by mouth daily.        . clonazePAM (KLONOPIN) 0.5 MG tablet Take 0.25 mg by mouth 2 (two) times daily.       . clopidogrel (PLAVIX) 75 MG tablet Take 75 mg by mouth daily.        Marland Kitchen dicyclomine (BENTYL) 10 MG capsule Take 10 mg by mouth 2 (two) times daily.      Marland Kitchen levothyroxine (SYNTHROID, LEVOTHROID) 75 MCG tablet Take 75 mcg by mouth daily before breakfast.      . metoprolol (LOPRESSOR) 100 MG tablet Take 50 mg by mouth 2 (two) times daily.       . nitroGLYCERIN (NITROSTAT) 0.4 MG  SL tablet Place 0.4 mg under the tongue every 5 (five) minutes as needed for chest pain.      . pantoprazole (PROTONIX) 40 MG tablet Take 40 mg by mouth daily.      Marland Kitchen spironolactone (ALDACTONE) 25 MG tablet Take 25 mg by mouth daily.        No current facility-administered medications for this visit.    Past Medical History  Diagnosis Date  . Atrial fibrillation     Paroxysmal - patient declines anticoagulation  . Hypothyroidism   . GERD (gastroesophageal reflux disease)   . Anxiety   . Aortic valve disorders   . Essential hypertension, benign   . History of TIA (transient ischemic attack)   . 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 Pulmonary nodule     Stable 4 mm left upper lobe, benign  . Carotid artery disease     Less than 50% bilaterally 2/13  . Skin cancer   . Hypertension   . Atrial fibrillation   . Hypothyroid     Past Surgical History  Procedure Laterality Date  . Abdominal hysterectomy    . Tonsillectomy    . Abdominal hysterectomy    . Skin cancer removed    . Tonsillectomy    . Cataract extraction bilateral w/ anterior vitrectomy    .  Colonoscopy N/A 05/20/2013    Procedure: COLONOSCOPY;  Surgeon: Malissa Hippo, MD;  Location: [facility];  Service: Endoscopy;  Laterality: N/A;  145    History   Social History  . Marital Status: Widowed    Spouse Name: N/A    Number of Children: N/A  . Years of Education: N/A   Occupational History  . RETIRED    Social History Main Topics  . Smoking status: Never Smoker   . Smokeless tobacco: Never Used  . Alcohol Use: No  . Drug Use: No  . Sexually Active: Not on file   Other Topics Concern  . Not on file   Social History Narrative  . No narrative on file    Family History  Problem Relation Age of Onset  . Diabetes      FAMILY HISTORY    ROS: no nausea, vomiting; no fever, chills; no melena, hematochezia; no claudication  PHYSICAL EXAM: BP 111/63  Pulse 69   Ht 5\' 6"  (1.676 m)  Wt 136 lb (61.689 kg)  BMI 21.96 kg/m2  SpO2 98% GENERAL: 79 year old female; NAD  HEENT: NCAT, PERRLA, EOMI; L subconjunctival hemorrhage; no xanthelasma  NECK: palpable bilateral carotid pulses, no bruits; no JVD; no TM  LUNGS: CTA bilaterally  CARDIAC: RRR (S1, S2); no significant murmurs; no rubs or gallops  ABDOMEN: soft, non-tender; intact BS  EXTREMETIES: no significant peripheral edema  SKIN: warm/dry; no obvious rash/lesions  MUSCULOSKELETAL: no joint deformity  NEURO: no focal deficit; NL affect    EKG:    ASSESSMENT & PLAN:  Atrial fibrillation Permanent, adequately rate controlled as evidenced by recent 48-hour Holter monitor. She did have one 2.9 second pause, asymptomatic. Average heart rate 59 bpm. She is feeling much better, since we reduced her Lopressor dose to 50 twice a day. Regarding the issue of anticoagulation, we discussed this with both her and her daughter, who presents today for the first time, and who was quite interested in one of the newer agents, but not Coumadin. She would like to do more research about Xarelto, the medication that I indicated to her is used most often in our group, before making a final decision. I told her that if she were to convince her mother to start on Xarelto, that she would then have to stop both ASA and Plavix.  Essential hypertension, benign Well-controlled on current dose beta blocker and Aldactone.  Hypothyroidism On Synthroid, followed by primary M.D. Recent TSH 8.46.    Gene Caige Almeda, PAC

## 2013-06-03 NOTE — Assessment & Plan Note (Signed)
Well-controlled on current dose beta blocker and Aldactone.

## 2013-06-03 NOTE — Assessment & Plan Note (Signed)
Permanent, adequately rate controlled as evidenced by recent 48-hour Holter monitor. She did have one 2.9 second pause, asymptomatic. Average heart rate 59 bpm. She is feeling much better, since we reduced her Lopressor dose to 50 twice a day. Regarding the issue of anticoagulation, we discussed this with both her and her daughter, who presents today for the first time, and who was quite interested in one of the newer agents, but not Coumadin. She would like to do more research about Xarelto, the medication that I indicated to her is used most often in our group, before making a final decision. I told her that if she were to convince her mother to start on Xarelto, that she would then have to stop both ASA and Plavix.

## 2013-06-03 NOTE — Patient Instructions (Addendum)
Continue all current medications. Follow up in  4 months  

## 2013-06-12 ENCOUNTER — Telehealth: Payer: Self-pay | Admitting: Physician Assistant

## 2013-06-12 NOTE — Telephone Encounter (Signed)
Patient informed via message machine that the results of her 48 hour monitor showed that her Atrial Fibrillation is stable and rate is well controlled.

## 2013-06-12 NOTE — Telephone Encounter (Signed)
Jasmine Torres stopped Gene Serpe PA-C in the hall at [facility] on 06-11-13 stating that she never heard from our office regarding her monitor results.

## 2013-10-06 ENCOUNTER — Ambulatory Visit: Payer: Medicare Other | Admitting: Cardiology

## 2013-10-19 ENCOUNTER — Ambulatory Visit (INDEPENDENT_AMBULATORY_CARE_PROVIDER_SITE_OTHER): Payer: Medicare Other | Admitting: Cardiology

## 2013-10-19 ENCOUNTER — Encounter: Payer: Self-pay | Admitting: Cardiology

## 2013-10-19 VITALS — BP 110/60 | HR 50 | Ht 66.0 in | Wt 135.0 lb

## 2013-10-19 DIAGNOSIS — I10 Essential (primary) hypertension: Secondary | ICD-10-CM

## 2013-10-19 DIAGNOSIS — I4891 Unspecified atrial fibrillation: Secondary | ICD-10-CM

## 2013-10-19 NOTE — Patient Instructions (Signed)
Your physician recommends that you schedule a follow-up appointment in: 3 months. Your physician recommends that you continue on your current medications as directed. Please refer to the Current Medication list given to you today. 

## 2013-10-19 NOTE — Assessment & Plan Note (Signed)
Blood pressure is normal today. 

## 2013-10-19 NOTE — Assessment & Plan Note (Signed)
Continue current strategy, on aspirin and Plavix, Lopressor for rate control. She is not interested in pursuing anticoagulation.

## 2013-10-19 NOTE — Progress Notes (Signed)
Clinical Summary Jasmine Torres is an 79 y.o.female last seen by Mr. Serpe PA-C in June. She has been treated medically for permanent atrial fibrillation, although has preferred to hold off on anticoagulant therapy based on prior discussions.  She is here with her daughter today. Still seems to be gone to the grieving process after losing her sister back in August. She tells me that they were very close.  She has good days and bad days. Last week felt a lot of palpitations, also had a migraine headache recently which she has not had in a long time.  She reports compliance with her medical therapy otherwise. Continues on aspirin.   Allergies  Allergen Reactions  . Penicillins Swelling    Current Outpatient Prescriptions  Medication Sig Dispense Refill  . aspirin EC 81 MG tablet Take 81 mg by mouth daily.        . Calcium-Magnesium-Vitamin D (CALCIUM MAGNESIUM PO) Take 1 tab by mouth every morning & 2 tabs at bedtime      . Cholecalciferol (VITAMIN D-3) 1000 UNITS CAPS Take 1 capsule by mouth daily.      . clonazePAM (KLONOPIN) 0.5 MG tablet Take 1/2 tab by mouth every morning & 1/2 - 1 at bedtime      . clopidogrel (PLAVIX) 75 MG tablet Take 75 mg by mouth daily.        Marland Kitchen levothyroxine (SYNTHROID, LEVOTHROID) 75 MCG tablet Take 75 mcg by mouth daily before breakfast.      . metoprolol (LOPRESSOR) 100 MG tablet Take 50 mg by mouth 2 (two) times daily.       . nitroGLYCERIN (NITROSTAT) 0.4 MG SL tablet Place 0.4 mg under the tongue every 5 (five) minutes as needed for chest pain.      . Omega-3 Fatty Acids (FISH OIL) 1200 MG CAPS Take 2 capsules by mouth every evening.      Marland Kitchen spironolactone (ALDACTONE) 25 MG tablet Take 25 mg by mouth daily.        No current facility-administered medications for this visit.    Past Medical History  Diagnosis Date  . Atrial fibrillation     Paroxysmal - patient declines anticoagulation  . Hypothyroidism   . GERD (gastroesophageal reflux disease)     . Anxiety   . Aortic valve disorders   . Essential hypertension, benign   . History of TIA (transient ischemic attack)   . 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 Pulmonary nodule     Stable 4 mm left upper lobe, benign  . Carotid artery disease     Less than 50% bilaterally 2/13  . Skin cancer   . Hypertension   . Atrial fibrillation   . Hypothyroid     Social History [name] reports that she has never smoked. She has never used smokeless tobacco. [name] reports that she does not drink alcohol.  Review of Systems Negative except as outlined.  Physical Examination Filed Vitals:   10/19/13 1408  BP: 110/60  Pulse: 50   Filed Weights   10/19/13 1408  Weight: 135 lb (61.236 kg)    No acute distress.  HEENT: Conjunctiva and lids normal, oropharynx clear.  Neck: Supple, no elevated JVP or carotid bruits, no thyromegaly.  Lungs: Clear to auscultation, nonlabored breathing at rest.  Cardiac: Irregularly irregular, no S3.  Abdomen: Soft, nontender, bowel sounds present, no guarding or rebound.  Extremities: No pitting edema, distal pulses  2+.   Problem List and Plan   Atrial fibrillation Continue current strategy, on aspirin and Plavix, Lopressor for rate control. She is not interested in pursuing anticoagulation.  Essential hypertension, benign Blood pressure is normal today.    Jonelle Sidle, M.D., F.A.C.C.

## 2013-12-14 ENCOUNTER — Encounter: Payer: Self-pay | Admitting: Cardiology

## 2013-12-14 ENCOUNTER — Encounter: Payer: Self-pay | Admitting: Cardiovascular Disease

## 2014-02-03 ENCOUNTER — Ambulatory Visit: Payer: Medicare Other | Admitting: Cardiology

## 2014-02-04 ENCOUNTER — Ambulatory Visit: Payer: Medicare Other | Admitting: Cardiology

## 2014-02-14 ENCOUNTER — Encounter: Payer: Self-pay | Admitting: Cardiovascular Disease

## 2014-02-23 ENCOUNTER — Encounter: Payer: Self-pay | Admitting: Cardiology

## 2014-02-23 ENCOUNTER — Ambulatory Visit (INDEPENDENT_AMBULATORY_CARE_PROVIDER_SITE_OTHER): Payer: Medicare Other | Admitting: Cardiology

## 2014-02-23 VITALS — BP 130/85 | HR 63 | Ht 66.0 in | Wt 135.0 lb

## 2014-02-23 DIAGNOSIS — I10 Essential (primary) hypertension: Secondary | ICD-10-CM

## 2014-02-23 DIAGNOSIS — I4891 Unspecified atrial fibrillation: Secondary | ICD-10-CM

## 2014-02-23 NOTE — Assessment & Plan Note (Signed)
Blood pressure control reasonable today.

## 2014-02-23 NOTE — Assessment & Plan Note (Signed)
Chronic, continue strategy of heart rate control. She is on aspirin and Plavix, has declined other anticoagulant options. We continue to discuss her options, she prefers to hold the course for now. Will request ECG from her urgent care visit. Followup in 4 months.

## 2014-02-23 NOTE — Progress Notes (Signed)
Clinical Summary Jasmine Torres is an 80 y.o.female last seen in November 2014. She tells me that she has been taking her medications regularly. Has been seen at the [facility] in the interim, at one point had some focal left shoulder discomfort, felt anxious, concerned about her atrial fibrillation and heart status as well. It is not clear that she had any rapid atrial fibrillation based on her description. She states that she did have an ECG.  She is here with a family member. We reviewed her medications, discussed options available for treatment of atrial fibrillation including antiarrhythmics and anticoagulation. We have had these discussions in the past as well. She remains clear that she does not want to change her current course of treatment.  Describes "stress" in the family, did not elaborate. Has had some trouble sleeping. She is on Klonopin under direction of Dr. Manuella Ghazi.   Allergies  Allergen Reactions  . Penicillins Swelling    Current Outpatient Prescriptions  Medication Sig Dispense Refill  . aspirin EC 81 MG tablet Take 81 mg by mouth daily.        . Calcium-Magnesium-Vitamin D (CALCIUM MAGNESIUM PO) Take 1 tab by mouth every morning & 2 tabs at bedtime      . clonazePAM (KLONOPIN) 0.5 MG tablet Take 1/2 tab by mouth every morning & 1/2 - 1 at bedtime      . clopidogrel (PLAVIX) 75 MG tablet Take 75 mg by mouth daily.        Marland Kitchen levothyroxine (SYNTHROID, LEVOTHROID) 75 MCG tablet Take 75 mcg by mouth daily before breakfast.      . metoprolol (LOPRESSOR) 100 MG tablet Take 50 mg by mouth 2 (two) times daily.       . nitroGLYCERIN (NITROSTAT) 0.4 MG SL tablet Place 0.4 mg under the tongue every 5 (five) minutes as needed for chest pain.      . Omega-3 Fatty Acids (FISH OIL) 1200 MG CAPS Take 2 capsules by mouth every evening.      Marland Kitchen spironolactone (ALDACTONE) 25 MG tablet Take 25 mg by mouth daily.        No current facility-administered medications for this visit.     Past Medical History  Diagnosis Date  . Atrial fibrillation     Patient declines anticoagulation  . Hypothyroidism   . GERD (gastroesophageal reflux disease)   . Anxiety   . Aortic valve disorders   . Essential hypertension, benign   . History of TIA (transient ischemic attack)   . 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 Pulmonary nodule     Stable 4 mm left upper lobe, benign  . Carotid artery disease     Less than 50% bilaterally 2/13  . Skin cancer     Social History Ms. Cost reports that she has never smoked. She has never used smokeless tobacco. Ms. Leggio reports that she does not drink alcohol.  Review of Systems No reproducible exertional chest pain. No bleeding episodes. No dizziness or syncope. Otherwise negative except as outlined.  Physical Examination Filed Vitals:   02/23/14 0841  BP: 130/85  Pulse: 63   Filed Weights   02/23/14 0841  Weight: 135 lb (61.236 kg)    No acute distress.  HEENT: Conjunctiva and lids normal, oropharynx clear.  Neck: Supple, no elevated JVP or carotid bruits, no thyromegaly.  Lungs: Clear to auscultation, nonlabored breathing at rest.  Cardiac: Irregularly irregular, no S3.  Abdomen: Soft, nontender, bowel sounds present, no guarding or rebound.  Extremities: No pitting edema, distal pulses 2+.   Problem List and Plan   Atrial fibrillation Chronic, continue strategy of heart rate control. She is on aspirin and Plavix, has declined other anticoagulant options. We continue to discuss her options, she prefers to hold the course for now. Will request ECG from her urgent care visit. Followup in 4 months.  Essential hypertension, benign Blood pressure control reasonable today.    Satira Sark, M.D., F.A.C.C.

## 2014-02-23 NOTE — Patient Instructions (Signed)
Your physician recommends that you schedule a follow-up appointment in: 4 months. You will receive a reminder letter in the mail in about 1-2 months reminding you to call and schedule your appointment. If you don't receive this letter, please contact our office. Your physician recommends that you continue on your current medications as directed. Please refer to the Current Medication list given to you today. 

## 2014-03-16 ENCOUNTER — Telehealth: Payer: Self-pay | Admitting: *Deleted

## 2014-03-16 NOTE — Telephone Encounter (Signed)
Patient has changed her mind about getting treated with an anti-coagulant for her atrial fibrillation. Patient said that whatever the doctor feels is best for her, she is willing to try it. Nurse informed patient that MD was not in the office this week but she would be called back once advise given from doctor.

## 2014-03-16 NOTE — Telephone Encounter (Signed)
Pt state that the last office visit she had the doctor had talked to her about starting a new medication. She has thought about it and would like to ask some questions.

## 2014-03-18 NOTE — Telephone Encounter (Signed)
Reviewed her chart. No specific anticoagulant was specified for this patient. Agree with having her follow up with Dr. Domenic Polite when he returns or discuss with him for recommendations without appt, via phone.

## 2014-03-21 NOTE — Telephone Encounter (Signed)
She has declined anticoagulation for some time now. Not sure why the change in thought. We can certainly discuss this in clinic. Can schedule visit in a few weeks.

## 2014-03-22 NOTE — Telephone Encounter (Signed)
Pt informed and appt given for 4-27 @ 10:20 pt verbalized and accepted appt.

## 2014-04-02 ENCOUNTER — Encounter: Payer: Self-pay | Admitting: Cardiovascular Disease

## 2014-04-07 ENCOUNTER — Telehealth: Payer: Self-pay | Admitting: *Deleted

## 2014-04-07 ENCOUNTER — Ambulatory Visit (INDEPENDENT_AMBULATORY_CARE_PROVIDER_SITE_OTHER): Payer: Medicare Other | Admitting: Cardiology

## 2014-04-07 ENCOUNTER — Encounter: Payer: Self-pay | Admitting: Cardiology

## 2014-04-07 VITALS — BP 134/92 | HR 50 | Ht 66.0 in | Wt 130.0 lb

## 2014-04-07 DIAGNOSIS — I4891 Unspecified atrial fibrillation: Secondary | ICD-10-CM

## 2014-04-07 DIAGNOSIS — I10 Essential (primary) hypertension: Secondary | ICD-10-CM

## 2014-04-07 MED ORDER — METOPROLOL TARTRATE 25 MG PO TABS: 25.0000 mg | ORAL_TABLET | Freq: Three times a day (TID) | ORAL | Status: DC

## 2014-04-07 NOTE — Assessment & Plan Note (Signed)
Plan at this time is to change Lopressor to 25 mg 3 times a day. I explained that she may need to go lower on the medication depending on her overall heart rate. She was willing to make this initial adjustment. Otherwise she would like to stay on aspirin and Plavix, although did tell me that she would consider the possibility of Eliquis, and would also check with her pharmacy to see how much it would cost. Plan to see her back in 6 weeks.

## 2014-04-07 NOTE — Progress Notes (Signed)
Clinical Summary Ms. Bert is an 80 y.o.female last seen in the office in March. She was seen on April 17 at the [facility] related to brief episode of lightheadedness without syncope. Records indicate that she was noted to be in atrial fibrillation with heart rate in the 40s at that time, and her beta blocker dose was cut back. She was given a prescription to take Lopressor 25 mg twice daily, although decided not to make this change until she saw me. I did review her ECG from that day.  She reports no recurrent symptoms. Still feels palpitations in atrial fibrillation, but has not had obvious rapid rates. Today we discussed modification of her beta blocker dose in a more gradual fashion.  She also comes back in today to discuss the possibility of anticoagulation with known atrial fibrillation for stroke prophylaxis. She has consistently declined anticoagulation over time however. Her CHADSVASC score is 7. In general terms this will place her annual risk of stroke in the 8-10% range on aspirin and Plavix, but in the 3-4% range on Eliquis for instance. I asked her to think about this as an option, and we would continue to discuss it. She was not ready to make a change today.   Allergies  Allergen Reactions  . Penicillins Swelling    Current Outpatient Prescriptions  Medication Sig Dispense Refill  . aspirin EC 81 MG tablet Take 81 mg by mouth daily.        . Calcium-Magnesium-Vitamin D (CALCIUM MAGNESIUM PO) Take 1 tab by mouth every morning & 2 tabs at bedtime      . clonazePAM (KLONOPIN) 0.5 MG tablet Take 1/2 tab by mouth every morning & 1/2 - 1 at bedtime      . clopidogrel (PLAVIX) 75 MG tablet Take 75 mg by mouth daily.        Marland Kitchen levothyroxine (SYNTHROID, LEVOTHROID) 75 MCG tablet Take 75 mcg by mouth daily before breakfast.      . metoprolol (LOPRESSOR) 25 MG tablet Take 1 tablet (25 mg total) by mouth 3 (three) times daily.  90 tablet  6  . nitroGLYCERIN (NITROSTAT)  0.4 MG SL tablet Place 0.4 mg under the tongue every 5 (five) minutes as needed for chest pain.      . Omega-3 Fatty Acids (FISH OIL) 1200 MG CAPS Take 2 capsules by mouth every evening.      Marland Kitchen spironolactone (ALDACTONE) 25 MG tablet Take 25 mg by mouth daily.        No current facility-administered medications for this visit.    Past Medical History  Diagnosis Date  . Atrial fibrillation     Patient declines anticoagulation  . Hypothyroidism   . GERD (gastroesophageal reflux disease)   . Anxiety   . Aortic valve disorders   . Essential hypertension, benign   . History of TIA (transient ischemic attack)   . 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 Pulmonary nodule     Stable 4 mm left upper lobe, benign  . Carotid artery disease     Less than 50% bilaterally 2/13  . Skin cancer     Social History Ms. Hannis reports that she has never smoked. She has never used smokeless tobacco. Ms. Vidrine reports that she does not drink alcohol.  Review of Systems As outlined above. Otherwise negative.  Physical Examination Filed Vitals:   04/07/14 1510  BP: 134/92  Pulse: 50  Filed Weights   04/07/14 1510  Weight: 130 lb (58.968 kg)    No acute distress.  HEENT: Conjunctiva and lids normal, oropharynx clear.  Neck: Supple, no elevated JVP or carotid bruits, no thyromegaly.  Lungs: Clear to auscultation, nonlabored breathing at rest.  Cardiac: Irregularly irregular, no S3.  Abdomen: Soft, nontender, bowel sounds present, no guarding or rebound.  Extremities: No pitting edema, distal pulses 2+.   Problem List and Plan   Atrial fibrillation Plan at this time is to change Lopressor to 25 mg 3 times a day. I explained that she may need to go lower on the medication depending on her overall heart rate. She was willing to make this initial adjustment. Otherwise she would like to stay on aspirin and Plavix, although did tell me that she would  consider the possibility of Eliquis, and would also check with her pharmacy to see how much it would cost. Plan to see her back in 6 weeks.  Essential hypertension, benign Blood pressure mildly elevated today.    Satira Sark, M.D., F.A.C.C.

## 2014-04-07 NOTE — Patient Instructions (Signed)
Your physician recommends that you schedule a follow-up appointment in: 6 weeks with Dr Domenic Polite in the [place] office.  Take Lopressor 25 mg three times a day.

## 2014-04-07 NOTE — Telephone Encounter (Signed)
Patient calling for an appointment to see Dr. Domenic Polite today. Patient said she was seen at [facility] last week on [address] in [place] for intermittent chest pain. She was told to see her cardiologist and is requesting an earlier appointment than 04/12/14. Patient denies sob or dizziness. Patient informed to use her nitroglycerin if the pain reoccurs and if it gets worse, she needed to go to the ED for an evaluation. Patient given appointment to see MD today at the [place] office. Records requested from [facility].

## 2014-04-07 NOTE — Assessment & Plan Note (Signed)
Blood pressure mildly elevated today

## 2014-04-12 ENCOUNTER — Ambulatory Visit: Payer: Medicare Other | Admitting: Cardiology

## 2014-04-15 ENCOUNTER — Encounter (HOSPITAL_COMMUNITY): Payer: Self-pay | Admitting: Emergency Medicine

## 2014-04-15 ENCOUNTER — Emergency Department (HOSPITAL_COMMUNITY): Payer: Medicare Other

## 2014-04-15 ENCOUNTER — Emergency Department (HOSPITAL_COMMUNITY)
Admission: EM | Admit: 2014-04-15 | Discharge: 2014-04-15 | Disposition: A | Discharge status: Home / Self Care | Payer: Medicare Other | Attending: Emergency Medicine | Admitting: Emergency Medicine

## 2014-04-15 DIAGNOSIS — Z7982 Long term (current) use of aspirin: Secondary | ICD-10-CM | POA: Diagnosis not present

## 2014-04-15 DIAGNOSIS — Z7902 Long term (current) use of antithrombotics/antiplatelets: Secondary | ICD-10-CM | POA: Diagnosis not present

## 2014-04-15 DIAGNOSIS — R0789 Other chest pain: Secondary | ICD-10-CM | POA: Diagnosis not present

## 2014-04-15 DIAGNOSIS — Z8673 Personal history of transient ischemic attack (TIA), and cerebral infarction without residual deficits: Secondary | ICD-10-CM | POA: Insufficient documentation

## 2014-04-15 DIAGNOSIS — M542 Cervicalgia: Secondary | ICD-10-CM | POA: Diagnosis not present

## 2014-04-15 DIAGNOSIS — R079 Chest pain, unspecified: Secondary | ICD-10-CM

## 2014-04-15 DIAGNOSIS — E039 Hypothyroidism, unspecified: Secondary | ICD-10-CM | POA: Diagnosis present

## 2014-04-15 DIAGNOSIS — R42 Dizziness and giddiness: Secondary | ICD-10-CM | POA: Diagnosis not present

## 2014-04-15 DIAGNOSIS — I4891 Unspecified atrial fibrillation: Secondary | ICD-10-CM

## 2014-04-15 DIAGNOSIS — Z85828 Personal history of other malignant neoplasm of skin: Secondary | ICD-10-CM | POA: Diagnosis not present

## 2014-04-15 DIAGNOSIS — Z8719 Personal history of other diseases of the digestive system: Secondary | ICD-10-CM | POA: Insufficient documentation

## 2014-04-15 DIAGNOSIS — F411 Generalized anxiety disorder: Secondary | ICD-10-CM | POA: Diagnosis not present

## 2014-04-15 DIAGNOSIS — Z880 Allergy status to penicillin: Secondary | ICD-10-CM | POA: Insufficient documentation

## 2014-04-15 DIAGNOSIS — Z79899 Other long term (current) drug therapy: Secondary | ICD-10-CM | POA: Insufficient documentation

## 2014-04-15 DIAGNOSIS — I10 Essential (primary) hypertension: Secondary | ICD-10-CM | POA: Insufficient documentation

## 2014-04-15 DIAGNOSIS — I482 Chronic atrial fibrillation, unspecified: Secondary | ICD-10-CM | POA: Diagnosis present

## 2014-04-15 DIAGNOSIS — R072 Precordial pain: Secondary | ICD-10-CM | POA: Diagnosis present

## 2014-04-15 LAB — CBC
HCT: 41.4 % (ref 36.0–46.0)
Hemoglobin: 14.1 g/dL (ref 12.0–15.0)
MCH: 31.2 pg (ref 26.0–34.0)
MCHC: 34.1 g/dL (ref 30.0–36.0)
MCV: 91.6 fL (ref 78.0–100.0)
PLATELETS: 222 10*3/uL (ref 150–400)
RBC: 4.52 MIL/uL (ref 3.87–5.11)
RDW: 13 % (ref 11.5–15.5)
WBC: 7.5 10*3/uL (ref 4.0–10.5)

## 2014-04-15 LAB — BASIC METABOLIC PANEL
BUN: 12 mg/dL (ref 6–23)
CALCIUM: 9.5 mg/dL (ref 8.4–10.5)
CO2: 27 meq/L (ref 19–32)
CREATININE: 0.83 mg/dL (ref 0.50–1.10)
Chloride: 94 mEq/L — ABNORMAL LOW (ref 96–112)
GFR calc Af Amer: 72 mL/min — ABNORMAL LOW (ref >90)
GFR, EST NON AFRICAN AMERICAN: 62 mL/min — AB (ref >90)
GLUCOSE: 124 mg/dL — AB (ref 70–99)
Potassium: 4.1 mEq/L (ref 3.7–5.3)
SODIUM: 133 meq/L — AB (ref 137–147)

## 2014-04-15 LAB — PROTIME-INR
INR: 1.01 (ref 0.00–1.49)
PROTHROMBIN TIME: 13.1 s (ref 11.6–15.2)

## 2014-04-15 LAB — I-STAT TROPONIN, ED: Troponin i, poc: 0 ng/mL (ref 0.00–0.08)

## 2014-04-15 LAB — TROPONIN I: Troponin I: <0.3 ng/mL (ref <0.30)

## 2014-04-15 LAB — PRO B NATRIURETIC PEPTIDE: Pro B Natriuretic peptide (BNP): 1478 pg/mL — ABNORMAL HIGH (ref 0–450)

## 2014-04-15 MED ORDER — ASPIRIN 325 MG PO TABS
325.0000 mg | ORAL_TABLET | ORAL | Status: DC
  Filled 2014-04-15: qty 1

## 2014-04-15 NOTE — ED Provider Notes (Signed)
CSN: [id number]     Arrival date & time 04/15/14  1008 History   First MD Initiated Contact with Patient 04/15/14 1034     Chief Complaint  Patient presents with  . Chest Pain     (Consider location/radiation/quality/duration/timing/severity/associated sxs/prior Treatment) HPI Comments: Patient presents with "heart problems". She states she's been having fluttering in her chest tightness in her chest it radiates to her left neck coming on for the past one week. Today she episode lasting 10 seconds of left-sided neck pain and chest pain that radiated to her back. This is longer than it has lasted in the past. She called her doctor who advised her to call EMS.  Now resolved. She denies any shortness of breath or nausea. No cough or fever. She has a history of atrial fibrillation but is not on anticoagulation. She was given aspirin by EMS. She denies any weakness in her arm any numbness or tingling. There is no pain to palpation of her neck. There is no pain with neck movement. She denies any falls. She has had the tightness in her chest before that radiates to her neck but denies any history of heart attack.  The history is provided by the patient and the EMS personnel.    Past Medical History  Diagnosis Date  . Atrial fibrillation     Patient declines anticoagulation  . Hypothyroidism   . GERD (gastroesophageal reflux disease)   . Anxiety   . Aortic valve disorders   . Essential hypertension, benign   . History of TIA (transient ischemic attack)   . 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 Pulmonary nodule     Stable 4 mm left upper lobe, benign  . Carotid artery disease     Less than 50% bilaterally 2/13  . Skin cancer    Past Surgical History  Procedure Laterality Date  . Abdominal hysterectomy    . Tonsillectomy    . Abdominal hysterectomy    . Skin cancer removed    . Tonsillectomy    . Cataract extraction bilateral w/ anterior vitrectomy     . Colonoscopy N/A 05/20/2013    Procedure: COLONOSCOPY;  Surgeon: Rogene Houston, MD;  Location: [facility];  Service: Endoscopy;  Laterality: N/A;  145   Family History  Problem Relation Age of Onset  . Diabetes Mellitus II Mother   . CAD Brother   . Breast cancer Sister    History  Substance Use Topics  . Smoking status: Never Smoker   . Smokeless tobacco: Never Used  . Alcohol Use: No   OB History   Grav Para Term Preterm Abortions TAB SAB Ect Mult Living                 Review of Systems  Constitutional: Negative for fever, activity change and appetite change.  Respiratory: Negative for cough, chest tightness and shortness of breath.   Cardiovascular: Negative for chest pain.  Gastrointestinal: Negative for nausea, vomiting and abdominal pain.  Genitourinary: Negative for dysuria, hematuria, vaginal bleeding and vaginal discharge.  Musculoskeletal: Positive for neck pain. Negative for arthralgias and myalgias.  Skin: Negative for wound.  Neurological: Positive for dizziness and light-headedness. Negative for weakness and headaches.  A complete 10 system review of systems was obtained and all systems are negative except as noted in the HPI and PMH.      Allergies  Penicillins  Home Medications   Prior to  Admission medications   Medication Sig Start Date End Date Taking? Authorizing Provider  aspirin EC 81 MG tablet Take 81 mg by mouth daily.      Historical Provider, MD  Calcium-Magnesium-Vitamin D (CALCIUM MAGNESIUM PO) Take 1 tab by mouth every morning & 2 tabs at bedtime    Historical Provider, MD  clonazePAM (KLONOPIN) 0.5 MG tablet Take 1/2 tab by mouth every morning & 1/2 - 1 at bedtime    Historical Provider, MD  clopidogrel (PLAVIX) 75 MG tablet Take 75 mg by mouth daily.      Historical Provider, MD  levothyroxine (SYNTHROID, LEVOTHROID) 75 MCG tablet Take 75 mcg by mouth daily before breakfast.    Historical Provider, MD  metoprolol (LOPRESSOR) 25 MG  tablet Take 1 tablet (25 mg total) by mouth 3 (three) times daily. 04/07/14   Satira Sark, MD  nitroGLYCERIN (NITROSTAT) 0.4 MG SL tablet Place 0.4 mg under the tongue every 5 (five) minutes as needed for chest pain.    Historical Provider, MD  Omega-3 Fatty Acids (FISH OIL) 1200 MG CAPS Take 2 capsules by mouth every evening.    Historical Provider, MD  spironolactone (ALDACTONE) 25 MG tablet Take 25 mg by mouth daily.     Historical Provider, MD   BP 113/54  Pulse 58  Temp(Src) 98 F (36.7 C) (Oral)  Resp 16  SpO2 100% Physical Exam  Constitutional: She is oriented to person, place, and time. She appears well-developed and well-nourished. No distress.  HENT:  Head: Normocephalic and atraumatic.  Mouth/Throat: Oropharynx is clear and moist. No oropharyngeal exudate.  Eyes: Conjunctivae and EOM are normal. Pupils are equal, round, and reactive to light.  Neck: Normal range of motion. Neck supple.  No carotid bruit.  Cardiovascular: Normal rate and normal heart sounds.   No murmur heard. irregular rhythm  Pulmonary/Chest: Effort normal. No respiratory distress.  Abdominal: Soft. There is no tenderness. There is no rebound and no guarding.  Musculoskeletal: Normal range of motion. She exhibits no edema and no tenderness.  Neurological: She is alert and oriented to person, place, and time. No cranial nerve deficit. Coordination normal.  CN 2-12 intact, no ataxia on finger to nose, no nystagmus, 5/5 strength throughout, no pronator drift, Romberg negative, normal gait.   Skin: Skin is warm.    ED Course  Procedures (including critical care time) Labs Review Labs Reviewed  BASIC METABOLIC PANEL - Abnormal; Notable for the following:    Sodium 133 (*)    Chloride 94 (*)    Glucose, Bld 124 (*)    GFR calc non Af Amer 62 (*)    GFR calc Af Amer 72 (*)    All other components within normal limits  PRO B NATRIURETIC PEPTIDE - Abnormal; Notable for the following:    Pro B  Natriuretic peptide (BNP) 1478.0 (*)    All other components within normal limits  CBC  PROTIME-INR  TROPONIN I  Randolm Idol, ED    Imaging Review Dg Chest Port 1 View  04/15/2014   CLINICAL DATA:  One week history of intermittent chest pain  EXAM: PORTABLE CHEST - 1 VIEW  COMPARISON:  Prior chest x-ray 12/14/2013  FINDINGS: Stable borderline cardiomegaly. Atherosclerotic calcifications noted in the transverse aorta. Stable biapical pleural parenchymal scarring. Mild bronchitic changes and interstitial prominence are similar compared to prior and likely reflect underlying COPD. No focal airspace consolidation, pleural effusion, pneumothorax or pulmonary edema. No acute osseous abnormality.  IMPRESSION: Stable chest x-ray  without evidence of acute cardiopulmonary process.   Electronically Signed   By: Jacqulynn Cadet M.D.   On: 04/15/2014 11:40     EKG Interpretation   Date/Time:  Thursday April 15 2014 10:18:34 EDT Ventricular Rate:  65 PR Interval:    QRS Duration: 76 QT Interval:  408 QTC Calculation: 424 R Axis:   23 Text Interpretation:  Atrial fibrillation Abnormal ECG Atrial fibrillation  Confirmed by Wyvonnia Dusky  MD, Takaya Hyslop ([id number]) on 04/15/2014 11:27:13 AM      MDM   Final diagnoses:  Atypical chest pain   Fleeting episode of neck and chest pain, lasting a few seconds at a time. Now resolved.  No weakness numbness, tingling. Slow a-fib, rate 40s-50s similar to previous. Denies any dizziness. No ST changes.  Troponin negative D/w cardiology who has seen patient and feels pain is noncardiac and patient can followup as outpatient. Troponin negative x 2. Patient HR acceptable per Dr. Johnsie Cancel. Patient continues to refuse anticoagulation. Cardiology will arrange follow up with Dr. Domenic Polite. Return precautions discussed.  BP 113/54  Pulse 58  Temp(Src) 98 F (36.7 C) (Oral)  Resp 16  SpO2 100%    Ezequiel Essex, MD 04/15/14 1750

## 2014-04-15 NOTE — Consult Note (Signed)
CARDIOLOGY CONSULT NOTE   Patient ID: Jasmine Torres MRN: [id number] DOB/AGE: 08-08-1925 80 y.o.  Admit date: 04/15/2014  Primary Physician   [name], MD Primary Cardiologist   Dr. Domenic Polite Reason for Consultation   Left chest and neck pain  TWS:FKCL C Rumberger is a 80 y.o. female with a history of atrial fibrillation. She saw Dr. Domenic Polite on 04/22 and her beta blocker is metoprolol 25 mg TID. She has been compliant with this. She had some heart rates in the 40s when seen at [facility] for light-headedness. Her BB was not changed.  Today, she had sudden onset of a sharp pain in the left side of her neck, it radiated around to her upper back and then she had sharp chest pain. She has had this before, but it would only last seconds. It resolved with time but lasted longer than usual so she came to the ER. Her pain resolved prior to arrival in the ER, she is currently resting comfortably.  EMS got heart rates in the 30s at one point, but she did not have presyncope or syncope. Today at home, her BP was 140/70 with a heart rate of 46. She has been dizzy frequently this week but has had head congestion this week also. She does not remember any low blood pressures.   She continues to feel that the risks of anticoagulation outweigh the benefits.    Past Medical History  Diagnosis Date  . Atrial fibrillation     Patient declines anticoagulation  . Hypothyroidism   . GERD (gastroesophageal reflux disease)   . Anxiety   . Aortic valve disorders   . Essential hypertension, benign   . History of TIA (transient ischemic attack)   . 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 Pulmonary nodule     Stable 4 mm left upper lobe, benign  . Carotid artery disease     Less than 50% bilaterally 2/13  . Skin cancer      Past Surgical History  Procedure Laterality Date  . Abdominal hysterectomy    . Tonsillectomy    . Abdominal hysterectomy    .  Skin cancer removed    . Tonsillectomy    . Cataract extraction bilateral w/ anterior vitrectomy    . Colonoscopy N/A 05/20/2013    Procedure: COLONOSCOPY;  Surgeon: Rogene Houston, MD;  Location: [facility];  Service: Endoscopy;  Laterality: N/A;  145    Allergies  Allergen Reactions  . Penicillins Swelling   I have reviewed the patient's current medications . aspirin  325 mg Oral STAT   Prior to Admission medications   Medication Sig Start Date End Date Taking? Authorizing Provider  aspirin EC 81 MG tablet Take 81 mg by mouth daily.     Yes Historical Provider, MD  Calcium-Magnesium-Vitamin D (CALCIUM MAGNESIUM PO) Take 1 tab by mouth every morning & 2 tabs at bedtime   Yes Historical Provider, MD  clonazePAM (KLONOPIN) 0.5 MG tablet Take 1/2 tab by mouth every morning & 1/2 - 1 at bedtime   Yes Historical Provider, MD  clopidogrel (PLAVIX) 75 MG tablet Take 75 mg by mouth daily.     Yes Historical Provider, MD  levothyroxine (SYNTHROID, LEVOTHROID) 75 MCG tablet Take 75 mcg by mouth daily before breakfast.   Yes Historical Provider, MD  metoprolol (LOPRESSOR) 25 MG tablet Take 1 tablet (25 mg total) by mouth 3 (three)  times daily. 04/07/14  Yes Satira Sark, MD  nitroGLYCERIN (NITROSTAT) 0.4 MG SL tablet Place 0.4 mg under the tongue every 5 (five) minutes as needed for chest pain.   Yes Historical Provider, MD  Omega-3 Fatty Acids (FISH OIL) 1200 MG CAPS Take 2 capsules by mouth every morning.    Yes Historical Provider, MD  spironolactone (ALDACTONE) 25 MG tablet Take 25 mg by mouth daily.    Yes Historical Provider, MD     History   Social History  . Marital Status: Widowed    Spouse Name: N/A    Number of Children: N/A  . Years of Education: N/A   Occupational History  . RETIRED    Social History Main Topics  . Smoking status: Never Smoker   . Smokeless tobacco: Never Used  . Alcohol Use: No  . Drug Use: No  . Sexual Activity: Not on file   Other Topics  Concern  . Not on file   Social History Narrative   Pt lives alone. Daughter helps with care.    Family Status  Relation Status Death Age  . Mother Deceased     DM  . Father Deceased     natural causes  . Sister Deceased     One had breast cancer, and the other cause uknown. Two are aliver  . Brother Deceased     CAD, One had heart transplant, Throat problems,  One had a MI.    Family History  Problem Relation Age of Onset  . Diabetes Mellitus II Mother   . CAD Brother   . Breast cancer Sister      ROS: No bleeding, GI problems, pt had recent dental work and is unable to chew because of this. Full 14 point review of systems complete and found to be negative unless listed above.  Physical Exam: Blood pressure 127/51, pulse 50, temperature 98 F (36.7 C), temperature source Oral, resp. rate 14, SpO2 100.00%.  General: Well developed, well nourished, elderly female in no acute distress Head: Eyes PERRLA, No xanthomas.   Normocephalic and atraumatic, oropharynx without edema or exudate. Dentition: poor Lungs: few dry rales, good air exchange Heart: Heart irregular rate and rhythm with S1, S2;  no murmur. pulses are 2+ all 4 extrem.   Neck: No carotid bruits. No lymphadenopathy.  JVD not elevated. Abdomen: Bowel sounds present, abdomen soft and non-tender without masses or hernias noted. Msk:  No spine or cva tenderness. No weakness, no joint deformities or effusions. Extremities: No clubbing or cyanosis. No edema.  Neuro: Alert and oriented X 3. No focal deficits noted. Psych:  Good affect, responds appropriately Skin: No rashes or lesions noted.  Labs:   Lab Results  Component Value Date   WBC 7.5 04/15/2014   HGB 14.1 04/15/2014   HCT 41.4 04/15/2014   MCV 91.6 04/15/2014   PLT 222 04/15/2014    Recent Labs  04/15/14 1041  INR 1.01     Recent Labs Lab 04/15/14 1041  NA 133*  K 4.1  CL 94*  CO2 27  BUN 12  CREATININE 0.83  CALCIUM 9.5  GLUCOSE 124*     Recent Labs  04/15/14 1049  TROPIPOC 0.00   Pro B Natriuretic peptide (BNP)  Date/Time Value Ref Range Status  04/15/2014 10:41 AM 1478.0* 0 - 450 pg/mL Final   TSH  Date/Time Value Ref Range Status  06/15/2010  9:55 AM 0.168 **Test methodology is 3rd generation TSH* 0.350 - 4.500 uIU/mL Final  ECG:  04/15/2014 Atrial fib, no acute ischemic changes  Radiology:  Dg Chest Port 1 View 04/15/2014   CLINICAL DATA:  One week history of intermittent chest pain  EXAM: PORTABLE CHEST - 1 VIEW  COMPARISON:  Prior chest x-ray 12/14/2013  FINDINGS: Stable borderline cardiomegaly. Atherosclerotic calcifications noted in the transverse aorta. Stable biapical pleural parenchymal scarring. Mild bronchitic changes and interstitial prominence are similar compared to prior and likely reflect underlying COPD. No focal airspace consolidation, pleural effusion, pneumothorax or pulmonary edema. No acute osseous abnormality.  IMPRESSION: Stable chest x-ray without evidence of acute cardiopulmonary process.   Electronically Signed   By: Jacqulynn Cadet M.D.   On: 04/15/2014 11:40    ASSESSMENT AND PLAN:   The patient was seen today by Dr. Johnsie Cancel, the patient evaluated and the data reviewed.   Principal Problem:   Precordial pain - atypical and likely MS in nature. Encouraged her to take Tylenol for symptoms and call for help if symptoms do not resolve. She will follow up with Dr. Domenic Polite ASAP to decide if echo or stress-testing is needed.  Active Problems:   Hypothyroidism - recheck TSH per MD     Atrial fibrillation, slow VR at times - Atrial fib may be permanent. HR is generally well-controlled, unclear if any symptoms are truly related to low heart rate since no documented hypotension. Requested pt take her BP/HR at home when she feels weak or dizzy, let Dr. Domenic Polite know the results.     Anticoagulation - Dr. Johnsie Cancel discussed the risks/benefits of anticoagulation with Ms. Majer and her daughter. Pt  is reluctant to go on full anticoagulation, will discuss with Dr. Domenic Polite.   Signed: Lonn Georgia, PA-C 04/15/2014 1:27 PM Beeper [phone]  Co-Sign MD  See my separate note.  Atypical pain resolved r/o no acute ECG changes  Afib rate ok Continue lower dose metoprolol and f/u with Dr Domenic Polite for echo and further discussion  Of anticoagulation possibly with eliquis 2.5 bid  Josue Hector

## 2014-04-15 NOTE — ED Notes (Signed)
Pt given sprite to drink. 

## 2014-04-15 NOTE — Discharge Instructions (Signed)
Chest Pain (Nonspecific) Follow up with Dr. Domenic Polite as scheduled.  Return to the ED if you develop new or worsening symptoms. It is often hard to give a specific diagnosis for the cause of chest pain. There is always a chance that your pain could be related to something serious, such as a heart attack or a blood clot in the lungs. You need to follow up with your caregiver for further evaluation. CAUSES   Heartburn.  Pneumonia or bronchitis.  Anxiety or stress.  Inflammation around your heart (pericarditis) or lung (pleuritis or pleurisy).  A blood clot in the lung.  A collapsed lung (pneumothorax). It can develop suddenly on its own (spontaneous pneumothorax) or from injury (trauma) to the chest.  Shingles infection (herpes zoster virus). The chest wall is composed of bones, muscles, and cartilage. Any of these can be the source of the pain.  The bones can be bruised by injury.  The muscles or cartilage can be strained by coughing or overwork.  The cartilage can be affected by inflammation and become sore (costochondritis). DIAGNOSIS  Lab tests or other studies, such as X-rays, electrocardiography, stress testing, or cardiac imaging, may be needed to find the cause of your pain.  TREATMENT   Treatment depends on what may be causing your chest pain. Treatment may include:  Acid blockers for heartburn.  Anti-inflammatory medicine.  Pain medicine for inflammatory conditions.  Antibiotics if an infection is present.  You may be advised to change lifestyle habits. This includes stopping smoking and avoiding alcohol, caffeine, and chocolate.  You may be advised to keep your head raised (elevated) when sleeping. This reduces the chance of acid going backward from your stomach into your esophagus.  Most of the time, nonspecific chest pain will improve within 2 to 3 days with rest and mild pain medicine. HOME CARE INSTRUCTIONS   If antibiotics were prescribed, take your  antibiotics as directed. Finish them even if you start to feel better.  For the next few days, avoid physical activities that bring on chest pain. Continue physical activities as directed.  Do not smoke.  Avoid drinking alcohol.  Only take over-the-counter or prescription medicine for pain, discomfort, or fever as directed by your caregiver.  Follow your caregiver's suggestions for further testing if your chest pain does not go away.  Keep any follow-up appointments you made. If you do not go to an appointment, you could develop lasting (chronic) problems with pain. If there is any problem keeping an appointment, you must call to reschedule. SEEK MEDICAL CARE IF:   You think you are having problems from the medicine you are taking. Read your medicine instructions carefully.  Your chest pain does not go away, even after treatment.  You develop a rash with blisters on your chest. SEEK IMMEDIATE MEDICAL CARE IF:   You have increased chest pain or pain that spreads to your arm, neck, jaw, back, or abdomen.  You develop shortness of breath, an increasing cough, or you are coughing up blood.  You have severe back or abdominal pain, feel nauseous, or vomit.  You develop severe weakness, fainting, or chills.  You have a fever. THIS IS AN EMERGENCY. Do not wait to see if the pain will go away. Get medical help at once. Call your local emergency services (911 in [place] Do not drive yourself to the hospital. MAKE SURE YOU:   Understand these instructions.  Will watch your condition.  Will get help right away if you are not  doing well or get worse. Document Released: 09/12/2005 Document Revised: 02/25/2012 Document Reviewed: 07/08/2008 [facility] Patient Information 2014 [employer].

## 2014-04-15 NOTE — ED Notes (Signed)
Per EMS:  Patient has had CP intermittently for 1 week, central chest radiates to left neck and today it radiated to left shoulder while washing dishes, patient has hx of afib, recently change in medications: changed to metoprolol, EMS noted that HR dropped to upper 30s with no alteration in LOC, only had 1 episode of CP with ems, no NTG, no ASA given pta.

## 2014-04-15 NOTE — ED Notes (Signed)
EKG delayed due to limited room availability.

## 2014-04-15 NOTE — Progress Notes (Signed)
Patient ID: Jasmine Torres, female   DOB: 1925/11/19, 80 y.o.   MRN: [id number] Patient examined chart reviewed.  Reviewed care with patient and daughter See full ER consult from PA Very short lived atypical neck and chest pain that sound musculoskeletal  Only lasted A few minutes and resolved spontaneously.  No history of CAD and indicated normal Stress test with Dr Brigitte Pulse in [place] about 2 years ago.    Lower dose lopressor 25 q 8 working well with no excessive bradycardia although I think She will eventually settle out with bid dose.    Discussed anticoagulations again and they are undecided  Suggested possibility of Eliquis 2.5 bid or xarelto 15 mg as compromise  Has f/u with SM already 6/3  Asymptomatic now Enzymes negative and ECG with afib and no acute ECG changes  D/C home with outpatient f/u  Suggested f/u echo as outpatient as there is none in [place] .

## 2014-04-15 NOTE — ED Notes (Signed)
Pt ambulated to and from restroom without difficulty.   

## 2014-04-21 ENCOUNTER — Encounter: Payer: Self-pay | Admitting: Cardiology

## 2014-04-21 ENCOUNTER — Ambulatory Visit (INDEPENDENT_AMBULATORY_CARE_PROVIDER_SITE_OTHER): Payer: Medicare Other | Admitting: Cardiology

## 2014-04-21 VITALS — BP 137/53 | HR 63 | Ht 66.0 in | Wt 130.0 lb

## 2014-04-21 DIAGNOSIS — I10 Essential (primary) hypertension: Secondary | ICD-10-CM

## 2014-04-21 DIAGNOSIS — I4891 Unspecified atrial fibrillation: Secondary | ICD-10-CM

## 2014-04-21 NOTE — Patient Instructions (Signed)
Your physician recommends that you schedule a follow-up appointment in: 3 months with Dr Ferne Reus will receive a reminder letter two months in advance reminding you to call and schedule your appointment. If you don't receive this letter, please contact our office.  Your physician recommends that you continue on your current medications as directed. Please refer to the Current Medication list given to you today.

## 2014-04-21 NOTE — Assessment & Plan Note (Signed)
No change to current regimen. 

## 2014-04-21 NOTE — Assessment & Plan Note (Signed)
Continue current medications, heart rate control seems to be adequate without symptomatic bradycardia. As noted above, she does not want to change to an anticoagulant, and will stay on aspirin and Plavix. Followup in 3 months, sooner if needed.

## 2014-04-21 NOTE — Progress Notes (Signed)
Clinical Summary Jasmine Torres is an 80 y.o.female just recently seen in the office on April 22. Lopressor had been adjusted to 25 mg 3 times a day at that time and I asked her to keep an eye on her heart rate control. She was seen in the ER at [facility] on April 30 following an episode of brief neck and chest discomfort. She was evaluated by Dr. Johnsie Cancel, symptoms were felt to be musculoskeletal. Cardiac enzymes were negative at that time. No other changes were made in her regimen. ECG showed atrial fibrillation with controlled ventricular response, and no significant ST segment changes.  Lab work from April 30 showed potassium 4.1, BUN 12, creatinine 0.8, troponin I less than 0.30, hemoglobin 14.1, platelets 222, pro-BNP 1478.  She had an echocardiogram done in 2012 showing normal LVEF at 55-60%, no major valvular abnormalities, normal left atrial chamber size.  She is here today with family member, states that she actually feels very well. Less fatigued, current dose of Lopressor seems to be doing well for her heart rate control. She remains of the mind that she does not want to go with anticoagulant, will stay on aspirin and Plavix. I told her that we can always discuss this again.   Allergies  Allergen Reactions  . Penicillins Swelling    Current Outpatient Prescriptions  Medication Sig Dispense Refill  . aspirin EC 81 MG tablet Take 81 mg by mouth daily.        . Calcium-Magnesium-Vitamin D (CALCIUM MAGNESIUM PO) Take 1 tab by mouth every morning & 2 tabs at bedtime      . clonazePAM (KLONOPIN) 0.5 MG tablet Take 1/2 tab by mouth every morning & 1/2 - 1 at bedtime      . clopidogrel (PLAVIX) 75 MG tablet Take 75 mg by mouth daily.        Marland Kitchen levothyroxine (SYNTHROID, LEVOTHROID) 75 MCG tablet Take 75 mcg by mouth daily before breakfast.      . metoprolol tartrate (LOPRESSOR) 25 MG tablet Take 25 mg by mouth 3 (three) times daily.       . nitroGLYCERIN (NITROSTAT) 0.4 MG SL tablet Place  0.4 mg under the tongue every 5 (five) minutes as needed for chest pain.      . Omega-3 Fatty Acids (FISH OIL) 1200 MG CAPS Take 2 capsules by mouth every morning.       Marland Kitchen spironolactone (ALDACTONE) 25 MG tablet Take 25 mg by mouth daily.        No current facility-administered medications for this visit.    Past Medical History  Diagnosis Date  . Atrial fibrillation     Patient declines anticoagulation  . Hypothyroidism   . GERD (gastroesophageal reflux disease)   . Anxiety   . Aortic valve disorders   . Essential hypertension, benign   . History of TIA (transient ischemic attack)   . 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 Pulmonary nodule     Stable 4 mm left upper lobe, benign  . Carotid artery disease     Less than 50% bilaterally 2/13  . Skin cancer     Social History Jasmine Torres reports that she has never smoked. She has never used smokeless tobacco. Jasmine Torres reports that she does not drink alcohol.  Review of Systems No falls. No recurrent chest pain. Otherwise as outlined.  Physical Examination Filed Vitals:   04/21/14 1304  BP: 137/53  Pulse: 63   Filed Weights   04/21/14 1304  Weight: 130 lb (58.968 kg)    No acute distress.  HEENT: Conjunctiva and lids normal, oropharynx clear.  Neck: Supple, no elevated JVP or carotid bruits, no thyromegaly.  Lungs: Clear to auscultation, nonlabored breathing at rest.  Cardiac: Irregularly irregular, no S3.  Abdomen: Soft, nontender, bowel sounds present, no guarding or rebound.  Extremities: No pitting edema, distal pulses 2+.   Problem List and Plan   Atrial fibrillation Continue current medications, heart rate control seems to be adequate without symptomatic bradycardia. As noted above, she does not want to change to an anticoagulant, and will stay on aspirin and Plavix. Followup in 3 months, sooner if needed.  Essential hypertension, benign No change to current  regimen.    Satira Sark, M.D., F.A.C.C.

## 2014-05-19 ENCOUNTER — Encounter: Payer: Medicare Other | Admitting: Cardiology

## 2014-05-19 ENCOUNTER — Encounter: Payer: Self-pay | Admitting: Cardiology

## 2014-05-19 NOTE — Progress Notes (Signed)
Rescheduled to correspond with planned followup as per most recent office visit.  This encounter was created in error - please disregard.

## 2014-07-27 ENCOUNTER — Encounter: Payer: Self-pay | Admitting: Cardiology

## 2014-07-27 ENCOUNTER — Ambulatory Visit (INDEPENDENT_AMBULATORY_CARE_PROVIDER_SITE_OTHER): Payer: Medicare Other | Admitting: Cardiology

## 2014-07-27 VITALS — BP 139/76 | HR 54 | Ht 66.0 in | Wt 132.0 lb

## 2014-07-27 DIAGNOSIS — I4891 Unspecified atrial fibrillation: Secondary | ICD-10-CM

## 2014-07-27 DIAGNOSIS — I482 Chronic atrial fibrillation, unspecified: Secondary | ICD-10-CM

## 2014-07-27 DIAGNOSIS — I10 Essential (primary) hypertension: Secondary | ICD-10-CM

## 2014-07-27 MED ORDER — NITROGLYCERIN 0.4 MG SL SUBL: 0.4000 mg | SUBLINGUAL_TABLET | SUBLINGUAL | Status: DC | PRN

## 2014-07-27 NOTE — Progress Notes (Signed)
    Clinical Summary Jasmine Torres is an 80 y.o.female last seen in May. She is here with a family membe today for routine followup. Fortunately, she reports good symptom control in terms of palpitations in the present beta blocker regimen. She has had no hospitalizations. Occasionally she misses her lunchtime dose of Lopressor and does feel symptomatic when she this occurs.  She continues to decline anticoagulation.   Allergies  Allergen Reactions  . Penicillins Swelling    Current Outpatient Prescriptions  Medication Sig Dispense Refill  . aspirin EC 81 MG tablet Take 81 mg by mouth daily.        . Calcium-Magnesium-Vitamin D (CALCIUM MAGNESIUM PO) Take 1 tab by mouth every morning & 2 tabs at bedtime      . clonazePAM (KLONOPIN) 0.5 MG tablet Take 1/2 tab by mouth every morning & 1/2 - 1 at bedtime      . clopidogrel (PLAVIX) 75 MG tablet Take 75 mg by mouth daily.        Marland Kitchen levothyroxine (SYNTHROID, LEVOTHROID) 50 MCG tablet Take 50 mcg by mouth daily before breakfast.      . metoprolol tartrate (LOPRESSOR) 25 MG tablet Take 25 mg by mouth 3 (three) times daily.       . nitroGLYCERIN (NITROSTAT) 0.4 MG SL tablet Place 1 tablet (0.4 mg total) under the tongue every 5 (five) minutes x 3 doses as needed for chest pain.  25 tablet  3  . Omega-3 Fatty Acids (FISH OIL) 1200 MG CAPS Take 2 capsules by mouth every morning.       Marland Kitchen spironolactone (ALDACTONE) 25 MG tablet Take 25 mg by mouth daily.        No current facility-administered medications for this visit.    Past Medical History  Diagnosis Date  . Atrial fibrillation     Patient declines anticoagulation  . Hypothyroidism   . GERD (gastroesophageal reflux disease)   . Anxiety   . Aortic valve disorders   . Essential hypertension, benign   . History of TIA (transient ischemic attack)   . 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 Pulmonary nodule     Stable 4 mm left upper lobe, benign  .  Carotid artery disease     Less than 50% bilaterally 2/13  . Skin cancer     Social History Jasmine Torres reports that she has never smoked. She has never used smokeless tobacco. Jasmine Torres reports that she does not drink alcohol.  Review of Systems No chest pain symptoms, no progressive shortness of breath. Other systems reviewed and negative except as outlined.  Physical Examination Filed Vitals:   07/27/14 1033  BP: 139/76  Pulse: 54   Filed Weights   07/27/14 1033  Weight: 132 lb (59.875 kg)    No acute distress.  HEENT: Conjunctiva and lids normal, oropharynx clear.  Neck: Supple, no elevated JVP or carotid bruits, no thyromegaly.  Lungs: Clear to auscultation, nonlabored breathing at rest.  Cardiac: Irregularly irregular, no S3.  Abdomen: Soft, nontender, bowel sounds present, no guarding or rebound.  Extremities: No pitting edema, distal pulses 2+.   Problem List and Plan   Chronic atrial fibrillation Continue aspirin as per above discussion and current dose of Lopressor. Followup arranged in 6 months.  Essential hypertension, benign Keep follow up with Dr. Manuella Ghazi.    Jasmine Torres, M.D., F.A.C.C.

## 2014-07-27 NOTE — Patient Instructions (Signed)

## 2014-07-27 NOTE — Assessment & Plan Note (Signed)
Continue aspirin as per above discussion and current dose of Lopressor. Followup arranged in 6 months.

## 2014-07-27 NOTE — Assessment & Plan Note (Signed)
Keep follow up with Dr. Manuella Ghazi.

## 2014-10-02 ENCOUNTER — Other Ambulatory Visit: Payer: Self-pay | Admitting: Cardiology

## 2014-10-28 ENCOUNTER — Other Ambulatory Visit: Payer: Self-pay | Admitting: Cardiology

## 2014-11-25 ENCOUNTER — Encounter (HOSPITAL_COMMUNITY): Payer: Self-pay | Admitting: Cardiology

## 2015-01-20 ENCOUNTER — Encounter: Payer: Self-pay | Admitting: Cardiology

## 2015-01-20 ENCOUNTER — Ambulatory Visit (INDEPENDENT_AMBULATORY_CARE_PROVIDER_SITE_OTHER): Payer: Medicare Other | Admitting: Cardiology

## 2015-01-20 VITALS — BP 118/72 | HR 61 | Ht 66.0 in | Wt 130.8 lb

## 2015-01-20 DIAGNOSIS — I482 Chronic atrial fibrillation, unspecified: Secondary | ICD-10-CM

## 2015-01-20 DIAGNOSIS — I10 Essential (primary) hypertension: Secondary | ICD-10-CM

## 2015-01-20 NOTE — Assessment & Plan Note (Signed)
Blood pressure control looks good today, no change to current regimen including Lopressor and Aldactone.

## 2015-01-20 NOTE — Assessment & Plan Note (Signed)
Plan to continue current medical regimen including Lopressor, aspirin and Plavix. She has consistently declined anticoagulation.

## 2015-01-20 NOTE — Progress Notes (Signed)
Cardiology Office Note  Date: 01/20/2015   ID: Jasmine Torres, DOB [birth year], MRN [id number]  PCP: [name], MD  Primary Cardiologist: Rozann Lesches, MD   Chief Complaint  Patient presents with  . Atrial Fibrillation    History of Present Illness: Jasmine Torres is an 81 y.o. female last seen in August 2015. She presents for a routine visit today with family member. She does not complain of any definite sense of palpitations or chest pain, but does state that she gets fatigued sometimes in the mornings when she does her chores. She reports that she has been trying to look more carefully at her diet in the last few weeks, and "eat better." They also asked about her taking some multivitamins with B supplements. She does not endorse any rapid sense of palpitations during her fatigue, has had no falls.  She remains on aspirin and has consistently declined anticoagulation.   Past Medical History  Diagnosis Date  . Atrial fibrillation     Patient declines anticoagulation  . Hypothyroidism   . GERD (gastroesophageal reflux disease)   . Anxiety   . Aortic valve disorders   . Essential hypertension, benign   . History of TIA (transient ischemic attack)   . 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 Pulmonary nodule     Stable 4 mm left upper lobe, benign  . Carotid artery disease     Less than 50% bilaterally 2/13  . Skin cancer     Current Outpatient Prescriptions  Medication Sig Dispense Refill  . aspirin EC 81 MG tablet Take 81 mg by mouth daily.      . Calcium-Magnesium-Vitamin D (CALCIUM MAGNESIUM PO) Take 1 tab by mouth every morning & 2 tabs at bedtime    . clonazePAM (KLONOPIN) 0.5 MG tablet Take 1/2 tab by mouth every morning & 1/2 - 1 at bedtime    . clopidogrel (PLAVIX) 75 MG tablet Take 75 mg by mouth daily.      Marland Kitchen levothyroxine (SYNTHROID, LEVOTHROID) 50 MCG tablet Take 50 mcg by mouth daily before breakfast.    . metoprolol  tartrate (LOPRESSOR) 25 MG tablet TAKE ONE (1) TABLET THREE (3) TIMES EACH DAY 90 tablet 6  . nitroGLYCERIN (NITROSTAT) 0.4 MG SL tablet Place 1 tablet (0.4 mg total) under the tongue every 5 (five) minutes x 3 doses as needed for chest pain. 25 tablet 3  . Omega-3 Fatty Acids (FISH OIL) 1200 MG CAPS Take 2 capsules by mouth every morning.     Marland Kitchen spironolactone (ALDACTONE) 25 MG tablet Take 25 mg by mouth daily.      No current facility-administered medications for this visit.    Allergies:  Penicillins   Social History: The patient  reports that she has never smoked. She has never used smokeless tobacco. She reports that she does not drink alcohol or use illicit drugs.   ROS:  Please see the history of present illness. Otherwise, complete review of systems is positive for mild resting tremor, arthritic pains.  All other systems are reviewed and negative.    PHYSICAL EXAM: VS:  BP 118/72 mmHg  Pulse 61  Ht 5\' 6"  (1.676 m)  Wt 130 lb 12.8 oz (59.33 kg)  BMI 21.12 kg/m2  SpO2 100%, BMI Body mass index is 21.12 kg/(m^2).  Wt Readings from Last 3 Encounters:  01/20/15 130 lb 12.8 oz (59.33 kg)  07/27/14 132 lb (59.875 kg)  04/21/14  130 lb (58.968 kg)     No acute distress.  HEENT: Conjunctiva and lids normal, oropharynx clear.  Neck: Supple, no elevated JVP or carotid bruits, no thyromegaly.  Lungs: Clear to auscultation, nonlabored breathing at rest.  Cardiac: Irregularly irregular, no S3.  Abdomen: Soft, nontender, bowel sounds present, no guarding or rebound.  Extremities: No pitting edema, distal pulses 2+. Neuropsychiatric: Resting tremor mainly noted in the hands.   ECG: ECG is not ordered today.   Recent Labwork: 04/15/2014: BUN 12; Creatinine 0.83; Hemoglobin 14.1; Platelets 222; Potassium 4.1; Pro B Natriuretic peptide (BNP) 1478.0*; Sodium 133*    ASSESSMENT AND PLAN:  Chronic atrial fibrillation Plan to continue current medical regimen including Lopressor,  aspirin and Plavix. She has consistently declined anticoagulation.   Essential hypertension, benign Blood pressure control looks good today, no change to current regimen including Lopressor and Aldactone.     Current medicines are reviewed at length with the patient today.  The patient does not have concerns regarding medicines.   Disposition: FU with m3 in 6 months.   Signed, Satira Sark, MD, [facility] 01/20/2015 1:26 PM    [facility] [place], [place], [place] [address] Phone: [phone]; Fax: [phone]

## 2015-01-20 NOTE — Patient Instructions (Signed)

## 2015-05-23 ENCOUNTER — Other Ambulatory Visit: Payer: Self-pay | Admitting: *Deleted

## 2015-05-23 MED ORDER — METOPROLOL TARTRATE 25 MG PO TABS: ORAL_TABLET | ORAL | Status: DC

## 2015-06-05 ENCOUNTER — Emergency Department (HOSPITAL_COMMUNITY)
Admission: EM | Admit: 2015-06-05 | Discharge: 2015-06-05 | Disposition: A | Discharge status: Home / Self Care | Payer: Medicare Other | Attending: Emergency Medicine | Admitting: Emergency Medicine

## 2015-06-05 ENCOUNTER — Emergency Department (HOSPITAL_COMMUNITY): Payer: Medicare Other

## 2015-06-05 ENCOUNTER — Encounter (HOSPITAL_COMMUNITY): Payer: Self-pay | Admitting: *Deleted

## 2015-06-05 DIAGNOSIS — R040 Epistaxis: Secondary | ICD-10-CM | POA: Diagnosis not present

## 2015-06-05 DIAGNOSIS — R110 Nausea: Secondary | ICD-10-CM | POA: Diagnosis not present

## 2015-06-05 DIAGNOSIS — Z9889 Other specified postprocedural states: Secondary | ICD-10-CM | POA: Diagnosis not present

## 2015-06-05 DIAGNOSIS — Z85828 Personal history of other malignant neoplasm of skin: Secondary | ICD-10-CM | POA: Insufficient documentation

## 2015-06-05 DIAGNOSIS — I4891 Unspecified atrial fibrillation: Secondary | ICD-10-CM | POA: Insufficient documentation

## 2015-06-05 DIAGNOSIS — Z8673 Personal history of transient ischemic attack (TIA), and cerebral infarction without residual deficits: Secondary | ICD-10-CM | POA: Diagnosis not present

## 2015-06-05 DIAGNOSIS — Z79899 Other long term (current) drug therapy: Secondary | ICD-10-CM | POA: Diagnosis not present

## 2015-06-05 DIAGNOSIS — Z7982 Long term (current) use of aspirin: Secondary | ICD-10-CM | POA: Diagnosis not present

## 2015-06-05 DIAGNOSIS — E039 Hypothyroidism, unspecified: Secondary | ICD-10-CM | POA: Diagnosis not present

## 2015-06-05 DIAGNOSIS — R51 Headache: Secondary | ICD-10-CM | POA: Insufficient documentation

## 2015-06-05 DIAGNOSIS — Z8719 Personal history of other diseases of the digestive system: Secondary | ICD-10-CM | POA: Diagnosis not present

## 2015-06-05 DIAGNOSIS — F419 Anxiety disorder, unspecified: Secondary | ICD-10-CM | POA: Diagnosis not present

## 2015-06-05 DIAGNOSIS — Z7902 Long term (current) use of antithrombotics/antiplatelets: Secondary | ICD-10-CM | POA: Insufficient documentation

## 2015-06-05 DIAGNOSIS — Z880 Allergy status to penicillin: Secondary | ICD-10-CM | POA: Insufficient documentation

## 2015-06-05 DIAGNOSIS — I10 Essential (primary) hypertension: Secondary | ICD-10-CM | POA: Diagnosis not present

## 2015-06-05 DIAGNOSIS — R519 Headache, unspecified: Secondary | ICD-10-CM

## 2015-06-05 LAB — COMPREHENSIVE METABOLIC PANEL
ALT: 16 U/L (ref 14–54)
AST: 32 U/L (ref 15–41)
Albumin: 3.8 g/dL (ref 3.5–5.0)
Alkaline Phosphatase: 36 U/L — ABNORMAL LOW (ref 38–126)
Anion gap: 8 (ref 5–15)
BILIRUBIN TOTAL: 1.1 mg/dL (ref 0.3–1.2)
BUN: 12 mg/dL (ref 6–20)
CALCIUM: 9.2 mg/dL (ref 8.9–10.3)
CO2: 25 mmol/L (ref 22–32)
Chloride: 103 mmol/L (ref 101–111)
Creatinine, Ser: 0.98 mg/dL (ref 0.44–1.00)
GFR calc non Af Amer: 50 mL/min — ABNORMAL LOW (ref >60)
GFR, EST AFRICAN AMERICAN: 58 mL/min — AB (ref >60)
Glucose, Bld: 172 mg/dL — ABNORMAL HIGH (ref 65–99)
Potassium: 3.8 mmol/L (ref 3.5–5.1)
Sodium: 136 mmol/L (ref 135–145)
Total Protein: 6.1 g/dL — ABNORMAL LOW (ref 6.5–8.1)

## 2015-06-05 LAB — CBC
HCT: 38.6 % (ref 36.0–46.0)
Hemoglobin: 12.9 g/dL (ref 12.0–15.0)
MCH: 30.8 pg (ref 26.0–34.0)
MCHC: 33.4 g/dL (ref 30.0–36.0)
MCV: 92.1 fL (ref 78.0–100.0)
Platelets: 159 10*3/uL (ref 150–400)
RBC: 4.19 MIL/uL (ref 3.87–5.11)
RDW: 13.5 % (ref 11.5–15.5)
WBC: 4.1 10*3/uL (ref 4.0–10.5)

## 2015-06-05 LAB — TROPONIN I: Troponin I: <0.03 ng/mL (ref <0.031)

## 2015-06-05 MED ORDER — ONDANSETRON 4 MG PO TBDP: 4.0000 mg | ORAL_TABLET | Freq: Three times a day (TID) | ORAL | Status: DC | PRN

## 2015-06-05 NOTE — ED Provider Notes (Signed)
CSN: [id number]     Arrival date & time 06/05/15  9379 History   First MD Initiated Contact with Patient 06/05/15 [phone]     Chief Complaint  Patient presents with  . Headache     (Consider location/radiation/quality/duration/timing/severity/associated sxs/prior Treatment) Patient is a 81 y.o. female presenting with headaches. The history is provided by the patient and medical records.  Headache   This is an 81 y.o. F with hx of AFIB, hypothyroidism, anxiety, GERD, hx of TIA, HTN, presenting to the ED by EMS for headache. Patient states this morning she had sudden onset of headache that was localized throughout her entire head, but worse over across her forehead associated with a brief nosebleed from her right nare.  She states this started while getting up to walk to the kitchen to make some breakfast. She states that the time she had brief episode of palpitations.  No chest pain or shortness of breath. Symptoms lasted approx 5 minutes before resolving.  On arrival to ED, patient states headache and palpitations have resolved. She does get headaches "from time to time" but no hx of migraines.  Last week she had some neck pain, none currently.  States happened upon waking, felt like a "crick' in the left side of her neck.  Family reports that this is her second nosebleed in the past month and they are unsure why. Patient is on aspirin and Plavix, but has never had issues with bleeding in the past. She does have history of A. fib which is usually controlled. Patient has had some nausea over the past few days, none currently.  No abdominal pain.  3 bowel movement so far this morning, loose but not diarrhea.  No melena or hematochezia.  No dizziness, focal weakness, numbness, paresthesias, confusion, changes in speech, gait disturbance.  No fever, chills, or recent illness.  Past Medical History  Diagnosis Date  . Atrial fibrillation     Patient declines anticoagulation  . Hypothyroidism   . GERD  (gastroesophageal reflux disease)   . Anxiety   . Aortic valve disorders   . Essential hypertension, benign   . History of TIA (transient ischemic attack)   . 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 Pulmonary nodule     Stable 4 mm left upper lobe, benign  . Carotid artery disease     Less than 50% bilaterally 2/13  . Skin cancer    Past Surgical History  Procedure Laterality Date  . Abdominal hysterectomy    . Tonsillectomy    . Abdominal hysterectomy    . Skin cancer removed    . Tonsillectomy    . Cataract extraction bilateral w/ anterior vitrectomy    . Colonoscopy N/A 05/20/2013    Procedure: COLONOSCOPY;  Surgeon: Rogene Houston, MD;  Location: [facility];  Service: Endoscopy;  Laterality: N/A;  145  . Left heart catheterization with coronary angiogram N/A 03/06/2013    Procedure: LEFT HEART CATHETERIZATION WITH CORONARY ANGIOGRAM;  Surgeon: Larey Dresser, MD;  Location: [facility] CATH LAB;  Service: Cardiovascular;  Laterality: N/A;   Family History  Problem Relation Age of Onset  . Diabetes Mellitus II Mother   . CAD Brother   . Breast cancer Sister    History  Substance Use Topics  . Smoking status: Never Smoker   . Smokeless tobacco: Never Used  . Alcohol Use: No   OB History    No data available  Review of Systems  Cardiovascular: Positive for palpitations.  Neurological: Positive for headaches.  All other systems reviewed and are negative.     Allergies  Penicillins  Home Medications   Prior to Admission medications   Medication Sig Start Date End Date Taking? Authorizing Provider  aspirin EC 81 MG tablet Take 81 mg by mouth daily.      Historical Provider, MD  Calcium-Magnesium-Vitamin D (CALCIUM MAGNESIUM PO) Take 1 tab by mouth every morning & 2 tabs at bedtime    Historical Provider, MD  clonazePAM (KLONOPIN) 0.5 MG tablet Take 1/2 tab by mouth every morning & 1/2 - 1 at bedtime    Historical Provider, MD   clopidogrel (PLAVIX) 75 MG tablet Take 75 mg by mouth daily.      Historical Provider, MD  levothyroxine (SYNTHROID, LEVOTHROID) 50 MCG tablet Take 50 mcg by mouth daily before breakfast.    Historical Provider, MD  metoprolol tartrate (LOPRESSOR) 25 MG tablet TAKE ONE (1) TABLET THREE (3) TIMES EACH DAY 05/23/15   Satira Sark, MD  nitroGLYCERIN (NITROSTAT) 0.4 MG SL tablet Place 1 tablet (0.4 mg total) under the tongue every 5 (five) minutes x 3 doses as needed for chest pain. 07/27/14   Satira Sark, MD  Omega-3 Fatty Acids (FISH OIL) 1200 MG CAPS Take 2 capsules by mouth every morning.     Historical Provider, MD  spironolactone (ALDACTONE) 25 MG tablet Take 25 mg by mouth daily.     Historical Provider, MD   BP 119/64 mmHg  Pulse 60  Temp(Src) 98 F (36.7 C) (Oral)  Resp 17  SpO2 100%   Physical Exam  Constitutional: She is oriented to person, place, and time. She appears well-developed and well-nourished. No distress.  HENT:  Head: Normocephalic and atraumatic.  Mouth/Throat: Oropharynx is clear and moist.  Eyes: Conjunctivae and EOM are normal. Pupils are equal, round, and reactive to light.  Neck: Normal range of motion and full passive range of motion without pain. No spinous process tenderness and no muscular tenderness present. No rigidity.  Neck non-tender, full ROM, no rigidity  Cardiovascular: Normal rate, regular rhythm and normal heart sounds.   Pulmonary/Chest: Effort normal and breath sounds normal. No respiratory distress. She has no wheezes.  Abdominal: Soft. Bowel sounds are normal. There is no tenderness. There is no guarding.  Musculoskeletal: Normal range of motion. She exhibits no edema.  Neurological: She is alert and oriented to person, place, and time. She has normal strength. She displays no tremor. No cranial nerve deficit or sensory deficit. She displays no seizure activity.  AAOx3, answering questions appropriately; equal strength UE and LE  bilaterally; CN grossly intact; moves all extremities appropriately without ataxia; no focal neuro deficits or facial asymmetry appreciated  Skin: Skin is warm and dry. She is not diaphoretic.  Psychiatric: She has a normal mood and affect.  Nursing note and vitals reviewed.   ED Course  Procedures (including critical care time) Labs Review Labs Reviewed  COMPREHENSIVE METABOLIC PANEL - Abnormal; Notable for the following:    Glucose, Bld 172 (*)    Total Protein 6.1 (*)    Alkaline Phosphatase 36 (*)    GFR calc non Af Amer 50 (*)    GFR calc Af Amer 58 (*)    All other components within normal limits  CBC  TROPONIN I    Imaging Review Dg Chest 2 View  06/05/2015   CLINICAL DATA:  Headache  EXAM: CHEST  2  VIEW  COMPARISON:  04/15/2014  FINDINGS: The heart is moderately enlarged. Lungs are markedly hyperaerated. Chronic pleural and parenchymal changes towards the lung apices are stable. No new consolidation or mass. No pneumothorax or pleural effusion. Osteopenia without compression deformity.  IMPRESSION: Cardiomegaly.  Chronic pulmonary parenchymal changes.   Electronically Signed   By: Marybelle Killings M.D.   On: 06/05/2015 09:40   Ct Head Wo Contrast  06/05/2015   CLINICAL DATA:  Headache  EXAM: CT HEAD WITHOUT CONTRAST  TECHNIQUE: Contiguous axial images were obtained from the base of the skull through the vertex without intravenous contrast.  COMPARISON:  03/30/2010  FINDINGS: Mild global atrophy appropriate to age. Chronic ischemic changes in the periventricular white matter. Large lacunar infarct in the left putamen. No mass effect, midline shift, or acute intracranial hemorrhage.  IMPRESSION: No acute intracranial pathology.   Electronically Signed   By: Marybelle Killings M.D.   On: 06/05/2015 09:27     EKG Interpretation None      MDM   Final diagnoses:  Headache  Epistaxis  Nausea   81 year old female here with multiple complaints, all of which have resolved at this time.  States last week some neck pain which she describes as a "crick in her neck". This resolved after one day. No injury or trauma related to this. She also complains of some nausea yesterday and earlier this morning which is also resolved. She did have 3 loose stools this morning but no diarrhea. She denies any abdominal pain and exam is benign here.  She had sudden onset of headache, palpitations, and nosebleed from right nare this morning while getting up to go make breakfast. History of intermittent headaches that occur "once in a while" but no diagnosis of migraines. Here patient is afebrile, nontoxic. She has no physical complaints at this time. She does have dried blood in her right nare, appears from medial right septum.  No active bleeding at this time.  No nasal deformities.  Her neurologic exam is nonfocal. She has no clinical signs of meningitis. Workup here including EKG, lab work, chest x-ray, CT head all unremarkable.  Patient remains in NSR with stable VS.  No recurrence of any of her former symptoms.  Patient appears stable for discharge. She has previously scheduled follow-up with her primary care physician tomorrow at 9 AM, encouraged to keep this appointment. Patient and family given printouts of lab work and imaging from today's ED visit to discuss with physician. Prescription for Zofran written if needed.  Discussed plan with patient, he/she acknowledged understanding and agreed with plan of care.  Return precautions given for new or worsening symptoms.  Case discussed with attending physician, Dr. Roderic Palau, who agrees with assessment and plan of care.  Larene Pickett, PA-C 06/05/15 1308  Milton Ferguson, MD 06/07/15 1415

## 2015-06-05 NOTE — ED Notes (Signed)
Bed: FD74 Expected date: 06/05/15 Expected time: 8:36 AM Means of arrival: Ambulance Comments: 81 yo headache, A-fib (history)

## 2015-06-05 NOTE — ED Notes (Signed)
EMS states they were called out for unknown medical, on arrival headache reported with fast heart rate, Rate 66 on arrival of EMS, pt has "something with her eye" headaches after this dx. On arrival no complaints. #20 L AC, EKG, no neuro deificits

## 2015-06-05 NOTE — ED Notes (Signed)
She is mildly h.o.h. And answers all questions appropriately and is in no distress.

## 2015-06-05 NOTE — ED Notes (Signed)
She is happy to hear she is to be d/c'd.  She is assisted for a second time this visit to ambulate to b.r.

## 2015-06-05 NOTE — Discharge Instructions (Signed)
Take the prescribed medication as directed if needed for nausea. Follow-up with Dr. Brigitte Pulse tomorrow as scheduled-- lab work attached on back for physician review. Return to the ED for new or worsening symptoms.

## 2015-06-05 NOTE — ED Notes (Signed)
Arm band placed on pt

## 2015-07-19 ENCOUNTER — Ambulatory Visit (INDEPENDENT_AMBULATORY_CARE_PROVIDER_SITE_OTHER): Payer: Medicare Other | Admitting: Cardiology

## 2015-07-19 ENCOUNTER — Encounter: Payer: Self-pay | Admitting: Cardiology

## 2015-07-19 VITALS — BP 122/70 | HR 58 | Ht 66.0 in | Wt 129.0 lb

## 2015-07-19 DIAGNOSIS — I482 Chronic atrial fibrillation, unspecified: Secondary | ICD-10-CM

## 2015-07-19 DIAGNOSIS — I2510 Atherosclerotic heart disease of native coronary artery without angina pectoris: Secondary | ICD-10-CM | POA: Diagnosis not present

## 2015-07-19 NOTE — Progress Notes (Signed)
Cardiology Office Note  Date: 07/19/2015   ID: Jasmine Torres, DOB 11-14-1925, MRN [id number]  PCP: Jasmine Blitz, MD  Primary Cardiologist: Jasmine Lesches, MD   Chief Complaint  Patient presents with  . Atrial Fibrillation    History of Present Illness: Jasmine Torres is an 81 y.o. female last seen in February. She is here today with a family member for follow-up. General reports exertional fatigue, no chest pain, occasional sense of palpitations. She feels that her heart rate is fast at times, although this has not been documented on her medical regimen, including at ER visit in June.  We reviewed her medications today. She remains on aspirin and has consistently declined anticoagulation.  Cardiac catheterization results from 2014 discussed today, she had mild coronary atherosclerosis with the exception of a 70% small D3 stenosis.  Past Medical History  Diagnosis Date  . Atrial fibrillation     Patient declines anticoagulation  . Hypothyroidism   . GERD (gastroesophageal reflux disease)   . Anxiety   . Aortic valve disorders   . Essential hypertension, benign   . History of TIA (transient ischemic attack)   . 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 Pulmonary nodule     Stable 4 mm left upper lobe, benign  . Carotid artery disease     Less than 50% bilaterally 2/13  . Skin cancer      Current Outpatient Prescriptions  Medication Sig Dispense Refill  . aspirin EC 81 MG tablet Take 81 mg by mouth daily.      . Calcium-Magnesium-Vitamin D (CALCIUM MAGNESIUM PO) Take 1 tab by mouth every morning & 2 tabs at bedtime    . clonazePAM (KLONOPIN) 0.5 MG tablet Take 1/2 tab by mouth every morning & 1/2 - 1 at bedtime    . clopidogrel (PLAVIX) 75 MG tablet Take 75 mg by mouth daily.      Marland Kitchen levothyroxine (SYNTHROID, LEVOTHROID) 50 MCG tablet Take 50 mcg by mouth daily before breakfast.    . metoprolol tartrate (LOPRESSOR) 25 MG tablet TAKE ONE  (1) TABLET THREE (3) TIMES EACH DAY 90 tablet 3  . nitroGLYCERIN (NITROSTAT) 0.4 MG SL tablet Place 1 tablet (0.4 mg total) under the tongue every 5 (five) minutes x 3 doses as needed for chest pain. 25 tablet 3  . Omega-3 Fatty Acids (FISH OIL) 1200 MG CAPS Take 2 capsules by mouth every morning.     . Pantoprazole Sodium (PROTONIX PO) Take by mouth.    . spironolactone (ALDACTONE) 25 MG tablet Take 25 mg by mouth daily.      No current facility-administered medications for this visit.    Allergies:  Penicillins   Social History: The patient  reports that she has never smoked. She has never used smokeless tobacco. She reports that she does not drink alcohol or use illicit drugs.   ROS:  Please see the history of present illness. Otherwise, complete review of systems is positive for fatigue.  All other systems are reviewed and negative.   Physical Exam: VS:  BP 122/70 mmHg  Pulse 58  Ht 5\' 6"  (1.676 m)  Wt 129 lb (58.514 kg)  BMI 20.83 kg/m2  SpO2 96%, BMI Body mass index is 20.83 kg/(m^2).  Wt Readings from Last 3 Encounters:  07/19/15 129 lb (58.514 kg)  01/20/15 130 lb 12.8 oz (59.33 kg)  07/27/14 132 lb (59.875 kg)     No acute distress.  HEENT: Conjunctiva and lids normal, oropharynx clear.  Neck: Supple, no elevated JVP or carotid bruits, no thyromegaly.  Lungs: Clear to auscultation, nonlabored breathing at rest.  Cardiac: Irregularly irregular, no S3.  Abdomen: Soft, nontender, bowel sounds present, no guarding or rebound.  Extremities: No pitting edema, distal pulses 2+. Neuropsychiatric: Resting tremor mainly noted in the hands and mouth. Somewhat masklike facies.   ECG: Tracing from 06/05/2015 showed rate-controlled atrial fibrillation.   Recent Labwork: 06/05/2015: ALT 16; AST 32; BUN 12; Creatinine, Ser 0.98; Hemoglobin 12.9; Platelets 159; Potassium 3.8; Sodium 136   Assessment and Plan:  1. Chronic atrial fibrillation, continue heart rate control  strategy. She has declined anticoagulation consistently and remains on aspirin.  2. History of mild coronary atherosclerosis by cardiac catheterization in 2014 as outlined above.  3. Exertional fatigue and decreased energy. Likely multifactorial. I would also question neurodegenerative condition such as Parkinson's.  Current medicines were reviewed with the patient today.   Disposition: FU with me in 6 months.   Signed, Jasmine Sark, MD, [facility] 07/19/2015 1:27 PM    [facility] at [place], [place], [place] [address] Phone: [phone]; Fax: [phone]

## 2015-07-19 NOTE — Patient Instructions (Signed)
Continue all current medications. Your physician wants you to follow up in: 6 months.  You will receive a reminder letter in the mail one-two months in advance.  If you don't receive a letter, please call our office to schedule the follow up appointment   

## 2015-09-19 ENCOUNTER — Other Ambulatory Visit: Payer: Self-pay | Admitting: *Deleted

## 2015-09-19 MED ORDER — METOPROLOL TARTRATE 25 MG PO TABS: ORAL_TABLET | ORAL | Status: DC

## 2015-12-21 ENCOUNTER — Other Ambulatory Visit (HOSPITAL_COMMUNITY): Payer: Self-pay | Admitting: Internal Medicine

## 2015-12-21 DIAGNOSIS — G459 Transient cerebral ischemic attack, unspecified: Secondary | ICD-10-CM

## 2015-12-22 ENCOUNTER — Ambulatory Visit (HOSPITAL_COMMUNITY)
Admission: RE | Admit: 2015-12-22 | Discharge: 2015-12-22 | Disposition: A | Discharge status: Home / Self Care | Payer: Medicare Other | Source: Ambulatory Visit | Attending: Internal Medicine | Admitting: Internal Medicine

## 2015-12-22 DIAGNOSIS — G459 Transient cerebral ischemic attack, unspecified: Secondary | ICD-10-CM | POA: Diagnosis present

## 2016-01-11 ENCOUNTER — Encounter: Payer: Self-pay | Admitting: Cardiology

## 2016-01-11 ENCOUNTER — Ambulatory Visit (INDEPENDENT_AMBULATORY_CARE_PROVIDER_SITE_OTHER): Payer: Medicare Other | Admitting: Cardiology

## 2016-01-11 VITALS — BP 142/60 | HR 51 | Ht 66.0 in | Wt 127.0 lb

## 2016-01-11 DIAGNOSIS — I10 Essential (primary) hypertension: Secondary | ICD-10-CM

## 2016-01-11 DIAGNOSIS — I482 Chronic atrial fibrillation, unspecified: Secondary | ICD-10-CM

## 2016-01-11 DIAGNOSIS — I779 Disorder of arteries and arterioles, unspecified: Secondary | ICD-10-CM | POA: Diagnosis not present

## 2016-01-11 DIAGNOSIS — I739 Peripheral vascular disease, unspecified: Secondary | ICD-10-CM

## 2016-01-11 NOTE — Progress Notes (Signed)
Cardiology Office Note  Date: 01/11/2016   ID: Jasmine Torres, DOB Nov 03, 1925, MRN [id number]  PCP: Jasmine Blitz, MD  Primary Cardiologist: Rozann Lesches, MD   Chief Complaint  Patient presents with  . Atrial Fibrillation    History of Present Illness: Jasmine Torres is an 82 y.o. female last seen in August 2016. She is here today with her daughter for a follow-up visit. Overall, she still reports good control of palpitations. She has had some times when she feels very weak however, but correlates this to when she does not eat regularly. There is also concern that she might have symptomatic bradycardia, but after a long discussion today, our decision was to leave her medications as they are and continue to monitor.  We reviewed her medications. Cardiac regimen includes aspirin, Plavix, Lopressor, and Aldactone. She has consistently declined anticoagulation for stroke prophylaxis. She does not report any bleeding problems.  Past Medical History  Diagnosis Date  . Atrial fibrillation [facility])     Patient declines anticoagulation  . Hypothyroidism   . GERD (gastroesophageal reflux disease)   . Anxiety   . Aortic valve disorders   . Essential hypertension, benign   . History of TIA (transient ischemic attack)   . 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 Pulmonary nodule     Stable 4 mm left upper lobe, benign  . Carotid artery disease ([place])     Less than 50% bilaterally 2/13  . Skin cancer     Current Outpatient Prescriptions  Medication Sig Dispense Refill  . aspirin EC 81 MG tablet Take 81 mg by mouth daily.      . Calcium-Magnesium-Vitamin D (CALCIUM MAGNESIUM PO) Take 1 tab by mouth every morning & 2 tabs at bedtime    . clonazePAM (KLONOPIN) 0.5 MG tablet Take 1/2 tab by mouth every morning & 1/2 - 1 at bedtime    . clopidogrel (PLAVIX) 75 MG tablet Take 75 mg by mouth daily.      Marland Kitchen levothyroxine (SYNTHROID, LEVOTHROID) 50 MCG tablet Take  50 mcg by mouth daily before breakfast.    . metoprolol tartrate (LOPRESSOR) 25 MG tablet TAKE ONE (1) TABLET THREE (3) TIMES EACH DAY 90 tablet 6  . nitroGLYCERIN (NITROSTAT) 0.4 MG SL tablet Place 1 tablet (0.4 mg total) under the tongue every 5 (five) minutes x 3 doses as needed for chest pain. 25 tablet 3  . Omega-3 Fatty Acids (FISH OIL) 1200 MG CAPS Take 2 capsules by mouth every morning.     . Pantoprazole Sodium (PROTONIX PO) Take by mouth as needed.     Marland Kitchen spironolactone (ALDACTONE) 25 MG tablet Take 25 mg by mouth daily.      No current facility-administered medications for this visit.   Allergies:  Penicillins   Social History: The patient  reports that she has never smoked. She has never used smokeless tobacco. She reports that she does not drink alcohol or use illicit drugs.   ROS:  Please see the history of present illness. Otherwise, complete review of systems is positive for tremor, gait unsteadiness.  All other systems are reviewed and negative.   Physical Exam: VS:  BP 142/60 mmHg  Pulse 51  Ht 5\' 6"  (1.676 m)  Wt 127 lb (57.607 kg)  BMI 20.51 kg/m2  SpO2 98%, BMI Body mass index is 20.51 kg/(m^2).  Wt Readings from Last 3 Encounters:  01/11/16 127 lb (57.607 kg)  07/19/15 129 lb (58.514 kg)  01/20/15 130 lb 12.8 oz (59.33 kg)    Elderly woman, appears comfortable at rest. HEENT: Conjunctiva and lids normal, oropharynx clear.  Neck: Supple, no elevated JVP or carotid bruits, no thyromegaly.  Lungs: Clear to auscultation, nonlabored breathing at rest.  Cardiac: Irregularly irregular, no S3.  Abdomen: Soft, nontender, bowel sounds present, no guarding or rebound.  Extremities: No pitting edema, distal pulses 2+. Neuropsychiatric: Resting tremor mainly noted in the hands and mouth. Somewhat masklike facies.  ECG:  I reviewed her previous tracing from 06/05/2015 which showed atrial fibrillation with decreased R wave progression.  Recent Labwork: 06/05/2015: ALT  16; AST 32; BUN 12; Creatinine, Ser 0.98; Hemoglobin 12.9; Platelets 159; Potassium 3.8; Sodium 136   Other Studies Reviewed Today:  Cardiac catheterization 03/06/2013: Hemodynamics: AO 120/69 LV 128/5  Coronary angiography: Coronary dominance: right  Left mainstem: No angiographic disease.   Left anterior descending (LAD): Mild luminal irregularities. Small D3 with 70% ostial stenosis.   Left circumflex (LCx): No angiographic disease.   Right coronary artery (RCA): No angiographic disease.   Left ventriculography: Not done due to CKD.   30 cc contrast used.   Carotid Dopplers 01/21/2012 [facility]): Less than 50% bilateral ICA stenoses.  Assessment and Plan:  1. Chronic atrial fibrillation. Continue current strategy of heart rate control, no changes made to Lopressor at present. Her heart rate is in the 50s, but she states that this has been fairly typical for her and not necessarily associated with the symptoms of weakness. She will continue to follow in case we need to make adjustments. As noted previously, she declines anticoagulation.  2. Essential hypertension, blood pressure control is adequate today.  3. History of carotid artery atherosclerosis less than 50%. This is been followed by Dr. Manuella Ghazi.  Current medicines were reviewed with the patient today.  Disposition: FU with me in 6 months.   Signed, Satira Sark, MD, [facility] 01/11/2016 9:42 AM    [facility] at [place], [place], [place] [address] Phone: [phone]; Fax: [phone]

## 2016-01-11 NOTE — Patient Instructions (Signed)
Your physician recommends that you continue on your current medications as directed. Please refer to the Current Medication list given to you today. Your physician recommends that you schedule a follow-up appointment in: 6 months. You will receive a reminder letter in the mail in about 4 months reminding you to call and schedule your appointment. If you don't receive this letter, please contact our office. 

## 2016-01-14 ENCOUNTER — Encounter (HOSPITAL_COMMUNITY): Payer: Self-pay | Admitting: Emergency Medicine

## 2016-01-14 ENCOUNTER — Emergency Department (HOSPITAL_COMMUNITY): Payer: Medicare Other

## 2016-01-14 ENCOUNTER — Observation Stay (HOSPITAL_COMMUNITY)
Admission: EM | Admit: 2016-01-14 | Discharge: 2016-01-17 | Disposition: A | Discharge status: Skilled Nursing Facility | Payer: Medicare Other | Source: Home / Self Care | Attending: Emergency Medicine | Admitting: Emergency Medicine

## 2016-01-14 DIAGNOSIS — R410 Disorientation, unspecified: Secondary | ICD-10-CM

## 2016-01-14 DIAGNOSIS — R42 Dizziness and giddiness: Secondary | ICD-10-CM | POA: Insufficient documentation

## 2016-01-14 DIAGNOSIS — E039 Hypothyroidism, unspecified: Secondary | ICD-10-CM | POA: Diagnosis present

## 2016-01-14 DIAGNOSIS — Z8673 Personal history of transient ischemic attack (TIA), and cerebral infarction without residual deficits: Secondary | ICD-10-CM | POA: Insufficient documentation

## 2016-01-14 DIAGNOSIS — R911 Solitary pulmonary nodule: Secondary | ICD-10-CM | POA: Insufficient documentation

## 2016-01-14 DIAGNOSIS — I10 Essential (primary) hypertension: Secondary | ICD-10-CM | POA: Insufficient documentation

## 2016-01-14 DIAGNOSIS — Z79899 Other long term (current) drug therapy: Secondary | ICD-10-CM

## 2016-01-14 DIAGNOSIS — S32501A Unspecified fracture of right pubis, initial encounter for closed fracture: Secondary | ICD-10-CM | POA: Insufficient documentation

## 2016-01-14 DIAGNOSIS — W109XXA Fall (on) (from) unspecified stairs and steps, initial encounter: Secondary | ICD-10-CM | POA: Insufficient documentation

## 2016-01-14 DIAGNOSIS — S32592A Other specified fracture of left pubis, initial encounter for closed fracture: Secondary | ICD-10-CM

## 2016-01-14 DIAGNOSIS — I482 Chronic atrial fibrillation, unspecified: Secondary | ICD-10-CM | POA: Diagnosis present

## 2016-01-14 DIAGNOSIS — S32591A Other specified fracture of right pubis, initial encounter for closed fracture: Secondary | ICD-10-CM | POA: Diagnosis not present

## 2016-01-14 DIAGNOSIS — Z7982 Long term (current) use of aspirin: Secondary | ICD-10-CM | POA: Insufficient documentation

## 2016-01-14 DIAGNOSIS — Z7902 Long term (current) use of antithrombotics/antiplatelets: Secondary | ICD-10-CM

## 2016-01-14 DIAGNOSIS — S329XXA Fracture of unspecified parts of lumbosacral spine and pelvis, initial encounter for closed fracture: Secondary | ICD-10-CM | POA: Diagnosis present

## 2016-01-14 DIAGNOSIS — S32502A Unspecified fracture of left pubis, initial encounter for closed fracture: Secondary | ICD-10-CM | POA: Insufficient documentation

## 2016-01-14 DIAGNOSIS — E785 Hyperlipidemia, unspecified: Secondary | ICD-10-CM

## 2016-01-14 DIAGNOSIS — K219 Gastro-esophageal reflux disease without esophagitis: Secondary | ICD-10-CM

## 2016-01-14 DIAGNOSIS — W19XXXA Unspecified fall, initial encounter: Secondary | ICD-10-CM

## 2016-01-14 HISTORY — DX: Hemorrhage of anus and rectum: K62.5

## 2016-01-14 LAB — URINALYSIS, ROUTINE W REFLEX MICROSCOPIC
BILIRUBIN URINE: NEGATIVE
Glucose, UA: NEGATIVE mg/dL
KETONES UR: NEGATIVE mg/dL
LEUKOCYTES UA: NEGATIVE
Nitrite: NEGATIVE
PROTEIN: NEGATIVE mg/dL
Specific Gravity, Urine: 1.009 (ref 1.005–1.030)
pH: 7 (ref 5.0–8.0)

## 2016-01-14 LAB — CBC WITH DIFFERENTIAL/PLATELET
Basophils Absolute: 0 10*3/uL (ref 0.0–0.1)
Basophils Relative: 0 %
EOS ABS: 0.1 10*3/uL (ref 0.0–0.7)
EOS PCT: 1 %
HCT: 37.4 % (ref 36.0–46.0)
HEMOGLOBIN: 12.2 g/dL (ref 12.0–15.0)
LYMPHS PCT: 11 %
Lymphs Abs: 1 10*3/uL (ref 0.7–4.0)
MCH: 30.8 pg (ref 26.0–34.0)
MCHC: 32.6 g/dL (ref 30.0–36.0)
MCV: 94.4 fL (ref 78.0–100.0)
MONOS PCT: 6 %
Monocytes Absolute: 0.6 10*3/uL (ref 0.1–1.0)
NEUTROS PCT: 82 %
Neutro Abs: 7.7 10*3/uL (ref 1.7–7.7)
Platelets: 169 10*3/uL (ref 150–400)
RBC: 3.96 MIL/uL (ref 3.87–5.11)
RDW: 13.6 % (ref 11.5–15.5)
WBC: 9.4 10*3/uL (ref 4.0–10.5)

## 2016-01-14 LAB — COMPREHENSIVE METABOLIC PANEL
ALK PHOS: 33 U/L — AB (ref 38–126)
ALT: 19 U/L (ref 14–54)
AST: 26 U/L (ref 15–41)
Albumin: 3.7 g/dL (ref 3.5–5.0)
Anion gap: 9 (ref 5–15)
BUN: 16 mg/dL (ref 6–20)
CALCIUM: 8.9 mg/dL (ref 8.9–10.3)
CO2: 26 mmol/L (ref 22–32)
CREATININE: 0.98 mg/dL (ref 0.44–1.00)
Chloride: 102 mmol/L (ref 101–111)
GFR, EST AFRICAN AMERICAN: 58 mL/min — AB (ref >60)
GFR, EST NON AFRICAN AMERICAN: 50 mL/min — AB (ref >60)
Glucose, Bld: 115 mg/dL — ABNORMAL HIGH (ref 65–99)
Potassium: 4 mmol/L (ref 3.5–5.1)
Sodium: 137 mmol/L (ref 135–145)
TOTAL PROTEIN: 5.8 g/dL — AB (ref 6.5–8.1)
Total Bilirubin: 1.3 mg/dL — ABNORMAL HIGH (ref 0.3–1.2)

## 2016-01-14 LAB — URINE MICROSCOPIC-ADD ON

## 2016-01-14 MED ORDER — OMEGA-3-ACID ETHYL ESTERS 1 G PO CAPS
2.0000 g | ORAL_CAPSULE | Freq: Every day | ORAL | Status: DC
  Administered 2016-01-15 – 2016-01-17 (×3): 2 g via ORAL
  Filled 2016-01-14 (×3): qty 2

## 2016-01-14 MED ORDER — ACETAMINOPHEN 325 MG PO TABS
650.0000 mg | ORAL_TABLET | Freq: Four times a day (QID) | ORAL | Status: DC | PRN
  Administered 2016-01-14 – 2016-01-16 (×5): 650 mg via ORAL
  Filled 2016-01-14 (×5): qty 2

## 2016-01-14 MED ORDER — ONDANSETRON HCL 4 MG/2ML IJ SOLN
4.0000 mg | Freq: Once | INTRAMUSCULAR | Status: AC
  Administered 2016-01-14: 4 mg via INTRAVENOUS
  Filled 2016-01-14: qty 2

## 2016-01-14 MED ORDER — ASPIRIN EC 81 MG PO TBEC
81.0000 mg | DELAYED_RELEASE_TABLET | Freq: Every day | ORAL | Status: DC
Start: 2016-01-15 — End: 2016-01-17
  Administered 2016-01-15 – 2016-01-17 (×3): 81 mg via ORAL
  Filled 2016-01-14 (×3): qty 1

## 2016-01-14 MED ORDER — PANTOPRAZOLE SODIUM 20 MG PO TBEC
20.0000 mg | DELAYED_RELEASE_TABLET | Freq: Every day | ORAL | Status: DC | PRN
  Filled 2016-01-14: qty 1

## 2016-01-14 MED ORDER — OXYCODONE HCL 5 MG PO TABS: 2.5000 mg | ORAL_TABLET | Freq: Once | ORAL | Status: DC

## 2016-01-14 MED ORDER — MORPHINE SULFATE (PF) 2 MG/ML IV SOLN
2.0000 mg | Freq: Once | INTRAVENOUS | Status: AC
  Administered 2016-01-14: 2 mg via INTRAVENOUS
  Filled 2016-01-14: qty 1

## 2016-01-14 MED ORDER — MORPHINE SULFATE (PF) 2 MG/ML IV SOLN: 2.0000 mg | INTRAVENOUS | Status: DC | PRN

## 2016-01-14 MED ORDER — CLONAZEPAM 0.5 MG PO TABS
0.2500 mg | ORAL_TABLET | Freq: Two times a day (BID) | ORAL | Status: DC
  Administered 2016-01-15 – 2016-01-16 (×5): 0.25 mg via ORAL
  Filled 2016-01-14 (×5): qty 1

## 2016-01-14 MED ORDER — ENOXAPARIN SODIUM 40 MG/0.4ML ~~LOC~~ SOLN
40.0000 mg | SUBCUTANEOUS | Status: DC
  Administered 2016-01-14 – 2016-01-16 (×3): 40 mg via SUBCUTANEOUS
  Filled 2016-01-14 (×4): qty 0.4

## 2016-01-14 MED ORDER — LEVOTHYROXINE SODIUM 50 MCG PO TABS
50.0000 ug | ORAL_TABLET | Freq: Every day | ORAL | Status: DC
  Administered 2016-01-15 – 2016-01-17 (×3): 50 ug via ORAL
  Filled 2016-01-14 (×4): qty 1

## 2016-01-14 MED ORDER — CALCIUM CARBONATE 1250 (500 CA) MG PO TABS
1.0000 | ORAL_TABLET | Freq: Every day | ORAL | Status: DC
  Administered 2016-01-15 – 2016-01-17 (×3): 500 mg via ORAL
  Filled 2016-01-14 (×4): qty 1

## 2016-01-14 MED ORDER — METHOCARBAMOL 500 MG PO TABS
500.0000 mg | ORAL_TABLET | Freq: Three times a day (TID) | ORAL | Status: DC
  Administered 2016-01-15 (×3): 500 mg via ORAL
  Filled 2016-01-14 (×5): qty 1

## 2016-01-14 MED ORDER — NITROGLYCERIN 0.4 MG SL SUBL: 0.4000 mg | SUBLINGUAL_TABLET | SUBLINGUAL | Status: DC | PRN

## 2016-01-14 MED ORDER — SPIRONOLACTONE 25 MG PO TABS
25.0000 mg | ORAL_TABLET | Freq: Every day | ORAL | Status: DC
  Administered 2016-01-15: 25 mg via ORAL
  Filled 2016-01-14 (×2): qty 1

## 2016-01-14 MED ORDER — ACETAMINOPHEN 500 MG PO TABS
1000.0000 mg | ORAL_TABLET | Freq: Once | ORAL | Status: AC
  Administered 2016-01-14: 1000 mg via ORAL
  Filled 2016-01-14: qty 2

## 2016-01-14 MED ORDER — METOPROLOL TARTRATE 25 MG PO TABS
25.0000 mg | ORAL_TABLET | Freq: Three times a day (TID) | ORAL | Status: DC
  Administered 2016-01-15: 25 mg via ORAL
  Filled 2016-01-14 (×8): qty 1

## 2016-01-14 MED ORDER — ONDANSETRON HCL 4 MG PO TABS
4.0000 mg | ORAL_TABLET | Freq: Four times a day (QID) | ORAL | Status: DC | PRN
  Administered 2016-01-17: 4 mg via ORAL
  Filled 2016-01-14: qty 1

## 2016-01-14 MED ORDER — ONDANSETRON HCL 4 MG/2ML IJ SOLN: 4.0000 mg | Freq: Four times a day (QID) | INTRAMUSCULAR | Status: DC | PRN

## 2016-01-14 MED ORDER — ONDANSETRON 4 MG PO TBDP: 4.0000 mg | ORAL_TABLET | Freq: Once | ORAL | Status: DC

## 2016-01-14 MED ORDER — SODIUM CHLORIDE 0.9 % IV SOLN: 250.0000 mL | INTRAVENOUS | Status: DC | PRN

## 2016-01-14 MED ORDER — SODIUM CHLORIDE 0.9% FLUSH
3.0000 mL | Freq: Two times a day (BID) | INTRAVENOUS | Status: DC
  Administered 2016-01-14 – 2016-01-16 (×5): 3 mL via INTRAVENOUS

## 2016-01-14 MED ORDER — CLOPIDOGREL BISULFATE 75 MG PO TABS
75.0000 mg | ORAL_TABLET | Freq: Every day | ORAL | Status: DC
  Administered 2016-01-15 – 2016-01-17 (×3): 75 mg via ORAL
  Filled 2016-01-14 (×3): qty 1

## 2016-01-14 MED ORDER — POLYETHYLENE GLYCOL 3350 17 G PO PACK
17.0000 g | PACK | Freq: Every day | ORAL | Status: DC | PRN
Start: 2016-01-14 — End: 2016-01-17

## 2016-01-14 MED ORDER — SODIUM CHLORIDE 0.9% FLUSH: 3.0000 mL | INTRAVENOUS | Status: DC | PRN

## 2016-01-14 MED ORDER — ALUM & MAG HYDROXIDE-SIMETH 200-200-20 MG/5ML PO SUSP
30.0000 mL | Freq: Four times a day (QID) | ORAL | Status: DC | PRN
  Administered 2016-01-16: 30 mL via ORAL
  Filled 2016-01-14: qty 30

## 2016-01-14 MED ORDER — ACETAMINOPHEN 650 MG RE SUPP: 650.0000 mg | Freq: Four times a day (QID) | RECTAL | Status: DC | PRN

## 2016-01-14 MED ORDER — OXYCODONE HCL 5 MG PO TABS
5.0000 mg | ORAL_TABLET | ORAL | Status: DC | PRN
  Administered 2016-01-14 – 2016-01-16 (×5): 5 mg via ORAL
  Filled 2016-01-14 (×5): qty 1

## 2016-01-14 NOTE — H&P (Addendum)
History and Physical:    Jasmine Torres   [id number] DOB: [birth year] DOA: 01/14/2016  Referring MD/provider: Dr. Tyrone Nine PCP: [name], MD   Chief Complaint: Fall, pelvic pain, inability to walk.  History of Present Illness:   Jasmine Torres is an 82 y.o. female with a PMH of atrial fibrillation, hypertension, and TIA who was brought to the hospital via EMS after suffering from a fall at home. The patient denies loss of consciousness of though she does have some chronic dizziness that may have been contributory. She was walking down the steps when she lost her footing and fell to the floor. She was unable to stand after falling. Her family was present at the time, and called EMS. She reports left-sided hip pain with inability to bear weight. She also reported 20 minutes of heart palpitations and chest pain after the fall, but currently denies these symptoms. Pain was a 10 out of 10 after the fall, currently level 5/10 after receiving pain medications in the ER. Movement aggravates the pain. Upon initial evaluation in the ED, she underwent CT scan of the head, hip films, T and L-spine x-rays, chest x-ray and a left elbow film and was found to have bilateral pubic rami fractures, worse on the left. Because she is unable to bear weight/ambulate and she lives alone, she was referred for admission for pain control and a physical therapy evaluation.  ROS:   Review of Systems  Constitutional: Negative for fever, chills, weight loss and malaise/fatigue.  HENT: Negative.   Eyes: Negative.   Respiratory: Negative for cough and shortness of breath.   Cardiovascular: Positive for chest pain and palpitations.  Gastrointestinal: Positive for nausea. Negative for vomiting, diarrhea, blood in stool and melena.  Genitourinary: Positive for dysuria.  Musculoskeletal: Positive for joint pain and falls.  Skin: Negative.   Neurological: Positive for dizziness and weakness.  Endo/Heme/Allergies:  Negative.        Cold intolerance      Past Medical History:   Past Medical History  Diagnosis Date  . Atrial fibrillation [facility])     Patient declines anticoagulation  . Hypothyroidism   . GERD (gastroesophageal reflux disease)   . Anxiety   . Aortic valve disorders   . Essential hypertension, benign   . History of TIA (transient ischemic attack)   . 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 Pulmonary nodule     Stable 4 mm left upper lobe, benign  . Carotid artery disease ([place])     Less than 50% bilaterally 2/13  . Skin cancer   . Rectal bleeding 05/14/2013    Past Surgical History:   Past Surgical History  Procedure Laterality Date  . Abdominal hysterectomy    . Tonsillectomy    . Abdominal hysterectomy    . Skin cancer removed    . Tonsillectomy    . Cataract extraction bilateral w/ anterior vitrectomy    . Colonoscopy N/A 05/20/2013    Procedure: COLONOSCOPY;  Surgeon: Rogene Houston, MD;  [facility];  Service: Endoscopy;  Laterality: N/A;  145  . Left heart catheterization with coronary angiogram N/A 03/06/2013    Procedure: LEFT HEART CATHETERIZATION WITH CORONARY ANGIOGRAM;  Surgeon: Larey Dresser, MD;  Location: [facility] CATH LAB;  Service: Cardiovascular;  Laterality: N/A;    Social History:   Social History   Social History  . Marital Status: Widowed    Spouse Name:  N/A  . Number of Children: N/A  . Years of Education: N/A   Occupational History  . RETIRED    Social History Main Topics  . Smoking status: Never Smoker   . Smokeless tobacco: Never Used  . Alcohol Use: No  . Drug Use: No  . Sexual Activity: Not on file   Other Topics Concern  . Not on file   Social History Narrative   Pt lives alone. Daughter helps with care.    Family history:   Family History  Problem Relation Age of Onset  . Diabetes Mellitus II Mother   . CAD Brother   . Breast cancer Sister     Allergies    Penicillins  Current Medications:   Prior to Admission medications   Medication Sig Start Date End Date Taking? Authorizing Provider  aspirin EC 81 MG tablet Take 81 mg by mouth daily.     Yes Historical Provider, MD  Calcium-Magnesium-Vitamin D (CALCIUM MAGNESIUM PO) Take 1 tab by mouth every morning & 2 tabs at bedtime   Yes Historical Provider, MD  clonazePAM (KLONOPIN) 0.5 MG tablet Take 0.25 mg by mouth 2 (two) times daily.    Yes Historical Provider, MD  clopidogrel (PLAVIX) 75 MG tablet Take 75 mg by mouth daily.     Yes Historical Provider, MD  levothyroxine (SYNTHROID, LEVOTHROID) 50 MCG tablet Take 50 mcg by mouth daily before breakfast.   Yes Historical Provider, MD  metoprolol tartrate (LOPRESSOR) 25 MG tablet TAKE ONE (1) TABLET THREE (3) TIMES EACH DAY 09/19/15  Yes Satira Sark, MD  nitroGLYCERIN (NITROSTAT) 0.4 MG SL tablet Place 1 tablet (0.4 mg total) under the tongue every 5 (five) minutes x 3 doses as needed for chest pain. 07/27/14  Yes Satira Sark, MD  Omega-3 Fatty Acids (FISH OIL) 1200 MG CAPS Take 2 capsules by mouth every morning.    Yes Historical Provider, MD  pantoprazole (PROTONIX) 20 MG tablet Take 20 mg by mouth daily as needed for heartburn.   Yes Historical Provider, MD  spironolactone (ALDACTONE) 25 MG tablet Take 25 mg by mouth daily.    Yes Historical Provider, MD    Physical Exam:   Filed Vitals:   01/14/16 1443 01/14/16 1500 01/14/16 1530 01/14/16 1650  BP: 97/81 100/80 81/65 103/59  Pulse: 86 75 75 87  Temp:    98.5 F (36.9 C)  TempSrc:    Oral  Resp: 16   16  SpO2: 100% 97% 96% 98%     Physical Exam: Blood pressure 103/59, pulse 87, temperature 98.5 F (36.9 C), temperature source Oral, resp. rate 16, SpO2 98 %. Gen: No acute distress. Head: Normocephalic, atraumatic. Eyes: PERRL, EOMI, sclerae nonicteric. Mouth: Oropharynx clear.  Resting mouth tremor. Neck: Supple, no thyromegaly, no lymphadenopathy, no jugular venous  distention. Chest: Lungs are CTAB. CV: Heart sounds are irregularly irregular with a II/VI SEM Abdomen: Soft, nontender, nondistended with normal active bowel sounds. Extremities: Extremities are with trace edema. Resting tremor Skin: Warm and dry. Neuro: Alert and oriented times 2; grossly nonfocal. Psych: Mood and affect normal.   Data Review:    Labs: Basic Metabolic Panel:  Recent Labs Lab 01/14/16 1240  NA 137  K 4.0  CL 102  CO2 26  GLUCOSE 115*  BUN 16  CREATININE 0.98  CALCIUM 8.9   Liver Function Tests:  Recent Labs Lab 01/14/16 1240  AST 26  ALT 19  ALKPHOS 33*  BILITOT 1.3*  PROT 5.8*  ALBUMIN 3.7   CBC:  Recent Labs Lab 01/14/16 1240  WBC 9.4  NEUTROABS 7.7  HGB 12.2  HCT 37.4  MCV 94.4  PLT 169    Radiographic Studies: Dg Chest 2 View  01/14/2016  CLINICAL DATA:  Fall from bottom step with chest pain, initial encounter EXAM: CHEST  2 VIEW COMPARISON:  06/05/2015 FINDINGS: Cardiac shadow is within normal limits. The lungs are well aerated bilaterally. No focal infiltrate or sizable effusion is seen. No acute bony abnormality is noted. IMPRESSION: No active cardiopulmonary disease. Electronically Signed   By: Inez Catalina M.D.   On: 01/14/2016 14:03   Dg Thoracic Spine 2 View  01/14/2016  CLINICAL DATA:  Fall from stairs with upper back pain, initial encounter EXAM: THORACIC SPINE 2 VIEWS COMPARISON:  None. FINDINGS: Vertebral body height is well maintained. Mild osteophytic changes are seen. The pedicles are within normal limits and no paraspinal mass lesion is noted. Biapical pleural parenchymal scarring is seen. IMPRESSION: No acute bony abnormality noted. Electronically Signed   By: Inez Catalina M.D.   On: 01/14/2016 13:54   Dg Lumbar Spine Complete  01/14/2016  CLINICAL DATA:  Fall from stairs with low back pain, initial encounter EXAM: LUMBAR SPINE - COMPLETE 4+ VIEW COMPARISON:  11/20/2005 FINDINGS: Five lumbar type vertebral bodies are  well visualized. Vertebral body height is well maintained. Mild osteophytic changes are seen. No spondylolisthesis is noted. No other focal abnormality is seen. IMPRESSION: Mild degenerative change without acute abnormality per Electronically Signed   By: Inez Catalina M.D.   On: 01/14/2016 14:00   Dg Elbow Complete Left  01/14/2016  CLINICAL DATA:  [place] off bottom step now with left elbow pain. EXAM: LEFT ELBOW - COMPLETE 3+ VIEW COMPARISON:  None. FINDINGS: There is marked soft tissue swelling about the olecranon process, however this finding is without associated fracture, radiopaque foreign body or elbow joint effusion. Joint spaces are preserved. IMPRESSION: Marked soft tissue swelling about the olecranon process without associated fracture, radiopaque foreign body or elbow joint effusion. Electronically Signed   By: Sandi Mariscal M.D.   On: 01/14/2016 13:54   Ct Head Wo Contrast  01/14/2016  CLINICAL DATA:  82 year old female with dizziness and acute fall. EXAM: CT HEAD WITHOUT CONTRAST CT CERVICAL SPINE WITHOUT CONTRAST TECHNIQUE: Multidetector CT imaging of the head and cervical spine was performed following the standard protocol without intravenous contrast. Multiplanar CT image reconstructions of the cervical spine were also generated. COMPARISON:  12/22/2015 and prior head CTs FINDINGS: CT HEAD FINDINGS Mild generalized cerebral volume loss and very mild chronic small-vessel white matter ischemic changes again noted. An enlarged perivascular space just inferior to the left basal ganglia again noted. No acute intracranial abnormalities are identified, including mass lesion or mass effect, hydrocephalus, extra-axial fluid collection, midline shift, hemorrhage, or acute infarction. The visualized bony calvarium is unremarkable. CT CERVICAL SPINE FINDINGS There is no evidence of acute fracture or prevertebral soft tissue swelling. 1.5 mm anterolisthesis of C5 on C6 is likely degenerative. Mild-moderate  degenerative disc disease and spondylosis is C5-6 and C6-7 noted. Moderate multilevel facet arthropathy is identified. No focal no focal bony lesions are noted. Biapical pleuroparenchymal scarring is present. IMPRESSION: No evidence of acute intracranial abnormality. Mild atrophy and chronic small-vessel white matter ischemic changes. No evidence of acute cervical spine abnormality. Multilevel degenerative changes as described. Electronically Signed   By: Margarette Canada M.D.   On: 01/14/2016 13:03   Ct Cervical Spine Wo Contrast  01/14/2016  CLINICAL DATA:  82 year old female with dizziness and acute fall. EXAM: CT HEAD WITHOUT CONTRAST CT CERVICAL SPINE WITHOUT CONTRAST TECHNIQUE: Multidetector CT imaging of the head and cervical spine was performed following the standard protocol without intravenous contrast. Multiplanar CT image reconstructions of the cervical spine were also generated. COMPARISON:  12/22/2015 and prior head CTs FINDINGS: CT HEAD FINDINGS Mild generalized cerebral volume loss and very mild chronic small-vessel white matter ischemic changes again noted. An enlarged perivascular space just inferior to the left basal ganglia again noted. No acute intracranial abnormalities are identified, including mass lesion or mass effect, hydrocephalus, extra-axial fluid collection, midline shift, hemorrhage, or acute infarction. The visualized bony calvarium is unremarkable. CT CERVICAL SPINE FINDINGS There is no evidence of acute fracture or prevertebral soft tissue swelling. 1.5 mm anterolisthesis of C5 on C6 is likely degenerative. Mild-moderate degenerative disc disease and spondylosis is C5-6 and C6-7 noted. Moderate multilevel facet arthropathy is identified. No focal no focal bony lesions are noted. Biapical pleuroparenchymal scarring is present. IMPRESSION: No evidence of acute intracranial abnormality. Mild atrophy and chronic small-vessel white matter ischemic changes. No evidence of acute cervical  spine abnormality. Multilevel degenerative changes as described. Electronically Signed   By: Margarette Canada M.D.   On: 01/14/2016 13:03   Dg Hip Unilat With Pelvis 2-3 Views Left  01/14/2016  CLINICAL DATA:  Fall from stairs with left hip pain, initial encounter EXAM: DG HIP (WITH OR WITHOUT PELVIS) 2-3V LEFT COMPARISON:  None. FINDINGS: Pelvic ring demonstrates fractures through the pubic rami bilaterally involving predominately the right superior and left superior and inferior pubic rami. The proximal femur is within normal limits. No gross soft tissue abnormality is seen. IMPRESSION: Bilateral pubic rami fractures worst on the left. No other focal abnormality is noted. Electronically Signed   By: Inez Catalina M.D.   On: 01/14/2016 14:02   *I have personally reviewed the images above*  EKG: Independently reviewed. Atrial fibrillation of 55 bpm. Q waves in V1.   Assessment/Plan:   Principal Problem:   Bilateral pubic rami fractures ([place]) - Unfortunate situation since patient lives alone. - Admit for pain control. - Physical therapy evaluation requested. - Dr. Linda Hedges consulted by EDP.  Weightbearing as tolerated.  Active Problems:   Essential hypertension, benign - Continue metoprolol and spironolactone.    Hypothyroidism - Continue Synthroid.    Chronic atrial fibrillation (H[facility]) - Rate controlled. Management with aspirin/Plavix.    DVT prophylaxis - Lovenox ordered.  Code Status / Family Communication / Disposition Plan:   Code Status: Full. Family Communication: Daughter at the bedside. Disposition Plan: Home versus SNF when pain control, likely 1/29 or 1/30.   Time spent: 60 minutes.  Raveen Wieseler [facility] Pager [phone] Cell: [phone]   If 7PM-7AM, please contact night-coverage [email or web address] Password [facility] 01/14/2016, 5:36 PM

## 2016-01-14 NOTE — ED Notes (Signed)
Patient transported to CT 

## 2016-01-14 NOTE — ED Notes (Signed)
Patient still rates pain as an 8.  Unable to walk due to pain and still feeling a little nauseated.

## 2016-01-14 NOTE — ED Notes (Signed)
Bed: WA08 Expected date:  Expected time:  Means of arrival:  Comments: Fall  Hip pain

## 2016-01-14 NOTE — ED Provider Notes (Signed)
CSN: [id number]     Arrival date & time 01/14/16  1143 History   First MD Initiated Contact with Patient 01/14/16 1153     Chief Complaint  Patient presents with  . Fall     (Consider location/radiation/quality/duration/timing/severity/associated sxs/prior Treatment) Patient is a 82 y.o. female presenting with hip pain. The history is provided by the patient and a relative.  Hip Pain This is a new problem. The current episode started 1 to 2 hours ago. The problem occurs constantly. The problem has not changed since onset.Pertinent negatives include no chest pain, no abdominal pain, no headaches and no shortness of breath. Nothing aggravates the symptoms. Nothing relieves the symptoms. She has tried nothing for the symptoms. The treatment provided no relief.   82 yo F with a chief complaint of left-sided hip pain. Patient fell down one step on home. She been feeling a little dizzy for the past couple weeks.     Past Medical History  Diagnosis Date  . Atrial fibrillation [facility])     Patient declines anticoagulation  . Hypothyroidism   . GERD (gastroesophageal reflux disease)   . Anxiety   . Aortic valve disorders   . Essential hypertension, benign   . History of TIA (transient ischemic attack)   . 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 Pulmonary nodule     Stable 4 mm left upper lobe, benign  . Carotid artery disease ([place])     Less than 50% bilaterally 2/13  . Skin cancer    Past Surgical History  Procedure Laterality Date  . Abdominal hysterectomy    . Tonsillectomy    . Abdominal hysterectomy    . Skin cancer removed    . Tonsillectomy    . Cataract extraction bilateral w/ anterior vitrectomy    . Colonoscopy N/A 05/20/2013    Procedure: COLONOSCOPY;  Surgeon: Rogene Houston, MD;  Location: [facility];  Service: Endoscopy;  Laterality: N/A;  145  . Left heart catheterization with coronary angiogram N/A 03/06/2013    Procedure: LEFT  HEART CATHETERIZATION WITH CORONARY ANGIOGRAM;  Surgeon: Larey Dresser, MD;  Location: [facility] CATH LAB;  Service: Cardiovascular;  Laterality: N/A;   Family History  Problem Relation Age of Onset  . Diabetes Mellitus II Mother   . CAD Brother   . Breast cancer Sister    Social History  Substance Use Topics  . Smoking status: Never Smoker   . Smokeless tobacco: Never Used  . Alcohol Use: No   OB History    No data available     Review of Systems  Constitutional: Negative for fever and chills.  HENT: Negative for congestion and rhinorrhea.   Eyes: Negative for redness and visual disturbance.  Respiratory: Negative for shortness of breath and wheezing.   Cardiovascular: Negative for chest pain and palpitations.  Gastrointestinal: Negative for nausea, vomiting and abdominal pain.  Genitourinary: Negative for dysuria and urgency.  Musculoskeletal: Positive for myalgias and arthralgias.  Skin: Negative for pallor and wound.  Neurological: Negative for dizziness and headaches.      Allergies  Penicillins  Home Medications   Prior to Admission medications   Medication Sig Start Date End Date Taking? Authorizing Provider  aspirin EC 81 MG tablet Take 81 mg by mouth daily.     Yes Historical Provider, MD  Calcium-Magnesium-Vitamin D (CALCIUM MAGNESIUM PO) Take 1 tab by mouth every morning & 2 tabs at bedtime  Yes Historical Provider, MD  clonazePAM (KLONOPIN) 0.5 MG tablet Take 0.25 mg by mouth 2 (two) times daily.    Yes Historical Provider, MD  clopidogrel (PLAVIX) 75 MG tablet Take 75 mg by mouth daily.     Yes Historical Provider, MD  levothyroxine (SYNTHROID, LEVOTHROID) 50 MCG tablet Take 50 mcg by mouth daily before breakfast.   Yes Historical Provider, MD  metoprolol tartrate (LOPRESSOR) 25 MG tablet TAKE ONE (1) TABLET THREE (3) TIMES EACH DAY 09/19/15  Yes Satira Sark, MD  nitroGLYCERIN (NITROSTAT) 0.4 MG SL tablet Place 1 tablet (0.4 mg total) under the tongue every  5 (five) minutes x 3 doses as needed for chest pain. 07/27/14  Yes Satira Sark, MD  Omega-3 Fatty Acids (FISH OIL) 1200 MG CAPS Take 2 capsules by mouth every morning.    Yes Historical Provider, MD  pantoprazole (PROTONIX) 20 MG tablet Take 20 mg by mouth daily as needed for heartburn.   Yes Historical Provider, MD  spironolactone (ALDACTONE) 25 MG tablet Take 25 mg by mouth daily.    Yes Historical Provider, MD   BP 100/80 mmHg  Pulse 75  Temp(Src) 98 F (36.7 C) (Oral)  Resp 16  SpO2 97% Physical Exam  Constitutional: She is oriented to person, place, and time. She appears well-developed and well-nourished. No distress.  HENT:  Head: Normocephalic and atraumatic.  Eyes: EOM are normal. Pupils are equal, round, and reactive to light.  Neck: Normal range of motion. Neck supple.  Cardiovascular: Normal rate and regular rhythm.  Exam reveals no gallop and no friction rub.   No murmur heard. Pulmonary/Chest: Effort normal. She has no wheezes. She has no rales.  Abdominal: Soft. She exhibits no distension. There is no tenderness.  Musculoskeletal: She exhibits no edema or tenderness.  Tender palpation about the left inguinal region. 3 mild tenderness with internal and external rotation of the left hip. Pulse motor and sensation intact distally. 3 large hematoma to the left lateral aspect of the hip. Also large hematoma noted to the base of the left elbow. No bony tenderness to the left forearm or elbow. Mild midline spinal tenderness through the entire spine.  Neurological: She is alert and oriented to person, place, and time.  Skin: Skin is warm and dry. She is not diaphoretic.  Psychiatric: She has a normal mood and affect. Her behavior is normal.  Nursing note and vitals reviewed.   ED Course  Procedures (including critical care time) Labs Review Labs Reviewed  COMPREHENSIVE METABOLIC PANEL - Abnormal; Notable for the following:    Glucose, Bld 115 (*)    Total Protein 5.8 (*)     Alkaline Phosphatase 33 (*)    Total Bilirubin 1.3 (*)    GFR calc non Af Amer 50 (*)    GFR calc Af Amer 58 (*)    All other components within normal limits  URINALYSIS, ROUTINE W REFLEX MICROSCOPIC (NOT AT [facility]) - Abnormal; Notable for the following:    APPearance CLOUDY (*)    Hgb urine dipstick LARGE (*)    All other components within normal limits  URINE MICROSCOPIC-ADD ON - Abnormal; Notable for the following:    Squamous Epithelial / LPF 0-5 (*)    Bacteria, UA RARE (*)    All other components within normal limits  CBC WITH DIFFERENTIAL/PLATELET    Imaging Review Ct Cervical Spine Wo Contrast  01/14/2016  CLINICAL DATA:  82 year old female with dizziness and acute fall. EXAM: CT HEAD  WITHOUT CONTRAST CT CERVICAL SPINE WITHOUT CONTRAST TECHNIQUE: Multidetector CT imaging of the head and cervical spine was performed following the standard protocol without intravenous contrast. Multiplanar CT image reconstructions of the cervical spine were also generated. COMPARISON:  12/22/2015 and prior head CTs FINDINGS: CT HEAD FINDINGS Mild generalized cerebral volume loss and very mild chronic small-vessel white matter ischemic changes again noted. An enlarged perivascular space just inferior to the left basal ganglia again noted. No acute intracranial abnormalities are identified, including mass lesion or mass effect, hydrocephalus, extra-axial fluid collection, midline shift, hemorrhage, or acute infarction. The visualized bony calvarium is unremarkable. CT CERVICAL SPINE FINDINGS There is no evidence of acute fracture or prevertebral soft tissue swelling. 1.5 mm anterolisthesis of C5 on C6 is likely degenerative. Mild-moderate degenerative disc disease and spondylosis is C5-6 and C6-7 noted. Moderate multilevel facet arthropathy is identified. No focal no focal bony lesions are noted. Biapical pleuroparenchymal scarring is present. IMPRESSION: No evidence of acute intracranial abnormality. Mild  atrophy and chronic small-vessel white matter ischemic changes. No evidence of acute cervical spine abnormality. Multilevel degenerative changes as described. Electronically Signed   By: Margarette Canada M.D.   On: 01/14/2016 13:03   I have personally reviewed and evaluated these images and lab results as part of my medical decision-making.   EKG Interpretation   Date/Time:  Saturday January 14 2016 12:21:18 EST Ventricular Rate:  55 PR Interval:    QRS Duration: 76 QT Interval:  443 QTC Calculation: 424 R Axis:   34 Text Interpretation:  Atrial fibrillation Probable anteroseptal infarct,  old No significant change since last tracing Confirmed by Elbert Polyakov MD, DANIEL  [phone]) on 01/14/2016 12:25:08 PM      MDM   Final diagnoses:  Fall    82 yo F with a chief complaints of bilateral hip pain after fall. Patient is found to have bilateral pubic rami fractures. Patient is unable to ambulate after pain medicine. Discussed the case with Dr. [name], orthopedics. Will be assisted weightbearing as tolerated. Patient lives alone.  Will admit for physical therapy.    The patients results and plan were reviewed and discussed.   Any x-rays performed were independently reviewed by myself.   Differential diagnosis were considered with the presenting HPI.  Medications  acetaminophen (TYLENOL) tablet 1,000 mg (1,000 mg Oral Given 01/14/16 1416)  ondansetron (ZOFRAN) injection 4 mg (4 mg Intravenous Given 01/14/16 1415)  morphine 2 MG/ML injection 2 mg (2 mg Intravenous Given 01/14/16 1416)    Filed Vitals:   01/14/16 1156 01/14/16 1443 01/14/16 1500  BP: 114/70 97/81 100/80  Pulse: 57 86 75  Temp: 98 F (36.7 C)    TempSrc: Oral    Resp: 16 16   SpO2: 97% 100% 97%    Final diagnoses:  Fall    Admission/ observation were discussed with the admitting physician, patient and/or family and they are comfortable with the plan.    Deno Etienne, DO 01/14/16 1533

## 2016-01-14 NOTE — ED Notes (Signed)
RN starting IV and will draw labs 

## 2016-01-14 NOTE — ED Notes (Signed)
Patient fell off the bottom step, she reports she just missed the step.  Family reports she did not hit head, she is c/o left hip pain and feeling dizzy.  Patient does take plavix, denies LOC.  Hematoma to left elbow and to left hip.  CBG-112  History of atrial fib.

## 2016-01-15 DIAGNOSIS — E038 Other specified hypothyroidism: Secondary | ICD-10-CM

## 2016-01-15 DIAGNOSIS — E785 Hyperlipidemia, unspecified: Secondary | ICD-10-CM

## 2016-01-15 DIAGNOSIS — S32502S Unspecified fracture of left pubis, sequela: Secondary | ICD-10-CM

## 2016-01-15 DIAGNOSIS — S32501S Unspecified fracture of right pubis, sequela: Secondary | ICD-10-CM

## 2016-01-15 DIAGNOSIS — I482 Chronic atrial fibrillation: Secondary | ICD-10-CM | POA: Diagnosis not present

## 2016-01-15 DIAGNOSIS — I10 Essential (primary) hypertension: Secondary | ICD-10-CM | POA: Diagnosis not present

## 2016-01-15 MED ORDER — METHOCARBAMOL 500 MG PO TABS
500.0000 mg | ORAL_TABLET | Freq: Three times a day (TID) | ORAL | Status: DC | PRN
  Administered 2016-01-15 – 2016-01-16 (×2): 500 mg via ORAL
  Filled 2016-01-15: qty 1

## 2016-01-15 NOTE — Progress Notes (Addendum)
Patient ID: Jasmine Torres, female   DOB: 1925-06-02, 82 y.o.   MRN: [id number] [facility] PROGRESS NOTE  Jasmine Torres [id number] DOB: [birth year] DOA: 01/14/2016 PCP: [name], MD  Brief narrative:    82 y.o. female with a past medical history significant for atrial fibrillation on anticoagulation with aspirin, hypertension, dyslipidemia, hypothyroidism who presented to [facility] long hospital status post fall at home. No reports of loss of consciousness. Patient was found to have pubic rami fracture. No acute intracranial findings and CT head and cervical spine. No fractures in thoracic and lumbar spine. No acute cardiopulmonary findings on chest x-ray.   Assessment/Plan:    Principal Problem:   Bilateral pubic rami fractures (HCC) - Status post fall - Obtain physical therapy evaluation for safe discharge plan - Continue pain management efforts  Active Problems:   Essential hypertension, benign - Continue metoprolol 25 mg 3 times a day - Continue Aldactone 25 mg daily    Hypothyroidism - Continue Synthroid 50 micrograms daily    Chronic atrial fibrillation (HCC) - CHADS vasc score 5 (Age, gender, HTN, history of TIA) - Rate controlled with metoprolol - On anticoagulation with aspirin and Plavix    Dyslipidemia - Continue omega-3 supplementation   DVT Prophylaxis  - Continue Lovenox subcutaneous   Code Status: Full.  Family Communication:  Family not at the bedside Disposition Plan: Needs physical therapy evaluation for safe discharge plan  IV access:  Peripheral IV  Procedures and diagnostic studies:    Dg Chest 2 View 01/14/2016 No active cardiopulmonary disease. Electronically Signed   By: Inez Catalina M.D.   On: 01/14/2016 14:03   Dg Thoracic Spine 2 View 01/14/2016 No acute bony abnormality noted. Electronically Signed   By: Inez Catalina M.D.   On: 01/14/2016 13:54   Dg Lumbar Spine Complete 01/14/2016   Mild degenerative change without acute abnormality  per Electronically Signed   By: Inez Catalina M.D.   On: 01/14/2016 14:00   Dg Elbow Complete Left 01/14/2016  Marked soft tissue swelling about the olecranon process without associated fracture, radiopaque foreign body or elbow joint effusion. Electronically Signed   By: Sandi Mariscal M.D.   On: 01/14/2016 13:54   Ct Head Wo Contrast 01/14/2016  No evidence of acute intracranial abnormality. Mild atrophy and chronic small-vessel white matter ischemic changes. No evidence of acute cervical spine abnormality. Multilevel degenerative changes as described. Electronically Signed   By: Margarette Canada M.D.   On: 01/14/2016 13:03   Ct Cervical Spine Wo Contrast 01/14/2016   No evidence of acute intracranial abnormality. Mild atrophy and chronic small-vessel white matter ischemic changes. No evidence of acute cervical spine abnormality. Multilevel degenerative changes as described. Electronically Signed   By: Margarette Canada M.D.   On: 01/14/2016 13:03   Dg Hip Unilat With Pelvis 2-3 Views Left 01/14/2016  Bilateral pubic rami fractures worst on the left. No other focal abnormality is noted. Electronically Signed   By: Inez Catalina M.D.   On: 01/14/2016 14:02   Medical Consultants:  None   Other Consultants:  PT  IAnti-Infectives:   None    Leisa Lenz, MD  [facility] Pager [phone]  Time spent in minutes: 25 minutes  If 7PM-7AM, please contact night-coverage [email or web address] Password [facility] 01/15/2016, 8:51 AM      HPI/Subjective: No acute overnight events. No reports of respiratory distress.  Objective: Filed Vitals:   01/14/16 2020 01/14/16 2031 01/15/16 0013 01/15/16 0337  BP:  93/59 109/71 98/53  Pulse: 71 87 97 95  Temp: 98.8 F (37.1 C)   98.8 F (37.1 C)  TempSrc: Oral   Oral  Resp: 16  16 16   Height:      Weight:      SpO2: 99%  98% 99%    Intake/Output Summary (Last 24 hours) at 01/15/16 0851 Last data filed at 01/15/16 0802  Gross per 24 hour  Intake    603 ml  Output     300 ml  Net    303 ml    Exam:   General:  Pt is alert, not in acute distress  Cardiovascular: Regular rate and rhythm, S1/S2 (+)  Respiratory: Clear to auscultation bilaterally, no wheezing, no crackles, no rhonchi  Abdomen: Soft, non tender, non distended, bowel sounds present  Extremities: No edema, pulses DP and PT palpable bilaterally  Neuro: Grossly nonfocal  Data Reviewed: Basic Metabolic Panel:  Recent Labs Lab 01/14/16 1240  NA 137  K 4.0  CL 102  CO2 26  GLUCOSE 115*  BUN 16  CREATININE 0.98  CALCIUM 8.9   Liver Function Tests:  Recent Labs Lab 01/14/16 1240  AST 26  ALT 19  ALKPHOS 33*  BILITOT 1.3*  PROT 5.8*  ALBUMIN 3.7   No results for input(s): LIPASE, AMYLASE in the last 168 hours. No results for input(s): AMMONIA in the last 168 hours. CBC:  Recent Labs Lab 01/14/16 1240  WBC 9.4  NEUTROABS 7.7  HGB 12.2  HCT 37.4  MCV 94.4  PLT 169   Cardiac Enzymes: No results for input(s): CKTOTAL, CKMB, CKMBINDEX, TROPONINI in the last 168 hours. BNP: Invalid input(s): POCBNP CBG: No results for input(s): GLUCAP in the last 168 hours.  No results found for this or any previous visit (from the past 240 hour(s)).   Scheduled Meds: . aspirin EC  81 mg Oral Daily  . calcium carbonate  1 tablet Oral Q breakfast  . clonazePAM  0.25 mg Oral BID  . clopidogrel  75 mg Oral Daily  . enoxaparin (LOVENOX) injection  40 mg Subcutaneous Q24H  . levothyroxine  50 mcg Oral QAC breakfast  . methocarbamol  500 mg Oral TID  . metoprolol tartrate  25 mg Oral TID  . omega-3 acid ethyl esters  2 g Oral Daily  . sodium chloride flush  3 mL Intravenous Q12H  . spironolactone  25 mg Oral Daily   Continuous Infusions:

## 2016-01-15 NOTE — Care Management Note (Signed)
Case Management Note  Patient Details  Name: Jasmine Torres MRN: [id number] Date of Birth: 1925-03-19  Subjective/Objective:  Bilateral pubic rami fractures                   Action/Plan: NCM spoke to pt at bedside. States she prefers to go home with dtr, [name]. Pt states prefers dtr sign obs notification. Gave permission to contact dtr, Richardine Service # [phone]. NCM contacted dtr, Richardine Service via phone. Dtr states pt has never had [facility] or a need for DME at home. States she will need RW and bedside commode. States she and her husband are at home to assist pt 24 hours a day. Explained pt was in observation status and that Medicare does not cover SNF-rehab without qualifying 3 night IP stay. She does have supplemental insurance with [employer] and dtr will follow up with plan to see if they will cover SNF-rehab if they decide on SNF. Explained NCM could not guarantee benefits for coverage for SNF. She would have to follow up with plan on tomorrow. NCM will continue to follow for dc needs, HH and DME set up.    Expected Discharge Date:                  Expected Discharge Plan:  [place]  In-House Referral:  Clinical Social Work  Discharge planning Services  CM Consult   Status of Service:  In process, will continue to follow  Medicare Important Message Given:    Date Medicare IM Given:    Medicare IM give by:    Date Additional Medicare IM Given:    Additional Medicare Important Message give by:     If discussed at [place] of Stay Meetings, dates discussed:    Additional Comments:  Erenest Rasher, RN 01/15/2016, 6:57 PM

## 2016-01-15 NOTE — Evaluation (Addendum)
Physical Therapy Evaluation Patient Details Name: Jasmine Torres MRN: [id number] DOB: June 12, 1925 Today's Date: 01/15/2016   History of Present Illness  Jasmine Torres is an 82 y.o. female with a PMH of atrial fibrillation, hypertension, and TIA who was brought to the hospital via EMS after suffering from a fall at home.  She was walking down the steps when she lost her footing and fell to the floor xray revealed pelvic fxs  Clinical Impression  Pt admitted with above diagnosis. Pt currently with functional limitations due to the deficits listed below (see PT Problem List).  Pt will benefit from skilled PT to increase their independence and safety with mobility to allow discharge to the venue listed below.    Pt with some nausea and dizziness when OOB this am, BP 109/60, HR 98 and sats 95-100% on RA to 2 L; symptoms subsided once pt in chair and she reported feeling much better; RN made aware; pt was also incontinent of urine upon standing;  Will continue to follow,dtr is hopeful that pt can return to her house at D/C; will plan for HHPT/OT since pt is obs; if progress slow may need SNF     Follow Up Recommendations Home health PT;Supervision/Assistance - 24 hour (vs SNF depending on progress)    Equipment Recommendations  Rolling walker with 5" wheels;3in1 (PT)     Recommendations for Other Services       Precautions / Restrictions Precautions Precautions: Fall Restrictions Other Position/Activity Restrictions: WBAT per Dr. [name] (EDP consult in Dr. Quincy Sheehan notes)      Mobility  Bed Mobility Overal bed mobility: Needs Assistance Bed Mobility: Supine to Sit     Supine to sit: Mod assist;HOB elevated     General bed mobility comments: incr time, cues to self assist; assist to bring trunk and LEs  Transfers Overall transfer level: Needs assistance Equipment used: Rolling walker (2 wheeled) Transfers: Sit to/from [facility] Sit to Stand: Min assist;Mod assist;+2  safety/equipment Stand pivot transfers: Mod assist;+2 safety/equipment       General transfer comment: cues for hand placement and safety  Ambulation/Gait Ambulation/Gait assistance: Min assist;+2 physical assistance Ambulation Distance (Feet): 6 Feet Assistive device: Rolling walker (2 wheeled) Gait Pattern/deviations: Shuffle;Step-to pattern;Decreased step length - right;Decreased step length - left;Trunk flexed     General Gait Details: cues for sequence, RW position and safety  Stairs            Wheelchair Mobility    Modified Rankin (Stroke Patients Only)       Balance Overall balance assessment: Needs assistance Sitting-balance support: Feet supported;Single extremity supported Sitting balance-Leahy Scale: Fair       Standing balance-Leahy Scale: Poor                               Pertinent Vitals/Pain Pain Assessment: 0-10 Pain Score: 2  Pain Location: L hip/pelvic area Pain Descriptors / Indicators: Sore;Aching Pain Intervention(s): Limited activity within patient's tolerance;Monitored during session;Premedicated before session;Repositioned    Home Living Family/patient expects to be discharged to:: Private residence Living Arrangements: Alone Available Help at Discharge: Family;Available 24 hours/day Type of Home: House (dtr's house) Home Access: Stairs to enter Entrance Stairs-Rails: None Entrance Stairs-Number of Steps: 2 Home Layout: Laundry or work area in basement;Able to live on main level with bedroom/bathroom;One level Home Equipment: None Additional Comments: per dtr pt can go to her house and have 24/7 care if needed;  pt normally lives alone and is quite active adn independent    Prior Function Level of Independence: Independent               Hand Dominance        Extremity/Trunk Assessment   Upper Extremity Assessment: Overall WFL for tasks assessed           Lower Extremity Assessment: Generalized  weakness;RLE deficits/detail;LLE deficits/detail RLE Deficits / Details: grossly  3 to 3+/5, AAROM LLE Deficits / Details: same, AAROM WFL     Communication   Communication: HOH  Cognition Arousal/Alertness: Awake/alert Behavior During Therapy: WFL for tasks assessed/performed Overall Cognitive Status: Within Functional Limits for tasks assessed                      General Comments      Exercises        Assessment/Plan    PT Assessment Patient needs continued PT services  PT Diagnosis Difficulty walking   PT Problem List Decreased strength;Decreased activity tolerance;Decreased balance;Decreased mobility  PT Treatment Interventions DME instruction;Gait training;Functional mobility training;Therapeutic activities;Patient/family education;Stair training;Therapeutic exercise   PT Goals (Current goals can be found in the Care Plan section) Acute Rehab PT Goals Patient Stated Goal: home PT Goal Formulation: With patient Time For Goal Achievement: 01/29/16 Potential to Achieve Goals: Good    Frequency Min 4X/week   Barriers to discharge        Co-evaluation               End of Session Equipment Utilized During Treatment: Gait belt Activity Tolerance: Patient limited by fatigue Patient left: in chair;with call bell/phone within reach;with family/visitor present;with chair alarm set      Functional Assessment Tool Used: clinical observation Functional Limitation: Mobility: Walking and moving around Mobility: Walking and Moving Around Current Status [phone]): At least 40 percent but less than 60 percent impaired, limited or restricted Mobility: Walking and Moving Around Goal Status [phone]): At least 20 percent but less than 40 percent impaired, limited or restricted    Time: 1103-1141 PT Time Calculation (min) (ACUTE ONLY): 38 min   Charges:   PT Evaluation $PT Eval Moderate Complexity: 1 Procedure PT Treatments $Gait Training: 8-22  mins $Therapeutic Activity: 8-22 mins   PT G Codes:   PT G-Codes **NOT FOR INPATIENT CLASS** Functional Assessment Tool Used: clinical observation Functional Limitation: Mobility: Walking and moving around Mobility: Walking and Moving Around Current Status [phone]): At least 40 percent but less than 60 percent impaired, limited or restricted Mobility: Walking and Moving Around Goal Status [phone]): At least 20 percent but less than 40 percent impaired, limited or restricted    [facility] 01/15/2016, 1:08 PM

## 2016-01-16 ENCOUNTER — Observation Stay (HOSPITAL_COMMUNITY): Payer: Medicare Other

## 2016-01-16 DIAGNOSIS — I10 Essential (primary) hypertension: Secondary | ICD-10-CM | POA: Diagnosis not present

## 2016-01-16 DIAGNOSIS — I482 Chronic atrial fibrillation: Secondary | ICD-10-CM | POA: Diagnosis not present

## 2016-01-16 DIAGNOSIS — S32501S Unspecified fracture of right pubis, sequela: Secondary | ICD-10-CM | POA: Diagnosis not present

## 2016-01-16 DIAGNOSIS — E038 Other specified hypothyroidism: Secondary | ICD-10-CM | POA: Diagnosis not present

## 2016-01-16 LAB — GLUCOSE, CAPILLARY: GLUCOSE-CAPILLARY: 141 mg/dL — AB (ref 65–99)

## 2016-01-16 NOTE — Progress Notes (Signed)
Patient ID: Jasmine Torres, female   DOB: 1925/11/01, 82 y.o.   MRN: [id number] [facility] PROGRESS NOTE  Jasmine Torres [id number] DOB: 1925/10/30 DOA: 01/14/2016 PCP: Jasmine Blitz, MD  Brief narrative:    82 y.o. female with a past medical history significant for atrial fibrillation on anticoagulation with aspirin, hypertension, dyslipidemia, hypothyroidism who presented to [facility] long hospital status post fall at home. No reports of loss of consciousness. Patient was found to have pubic rami fracture. No acute intracranial findings and CT head and cervical spine. No fractures in thoracic and lumbar spine. No acute cardiopulmonary findings on chest x-ray.  Assessment/Plan:    Principal Problem:   Bilateral pubic rami fractures (HCC) - Status post fall - Obtain physical therapy evaluation for safe discharge plan - Continue pain management efforts  Active Problems:   Essential hypertension, benign - Hold metoprolol 25 mg 3 times a day and Aldactone 25 mg daily due to hypotension - BP this am 96/52    Hypothyroidism - Continue Synthroid 50 mcgdaily    Chronic atrial fibrillation (HCC) - CHADS vasc score 5 (Age, gender, HTN, history of TIA) - Rate controlled with metoprolol - Continue anticoagulation with aspirin and Plavix    Dyslipidemia - Continue omega-3 supplementation  DVT Prophylaxis  - Continue Lovenox subQ  Code Status: Full.  Family Communication:  Daughter at the bedside  Disposition Plan: Needs physical therapy evaluation for safe discharge plan  IV access:  Peripheral IV  Procedures and diagnostic studies:    Dg Chest 2 View 01/14/2016 No active cardiopulmonary disease. Electronically Signed   By: Inez Catalina M.D.   On: 01/14/2016 14:03   Dg Thoracic Spine 2 View 01/14/2016 No acute bony abnormality noted. Electronically Signed   By: Inez Catalina M.D.   On: 01/14/2016 13:54   Dg Lumbar Spine Complete 01/14/2016   Mild degenerative change without acute  abnormality per Electronically Signed   By: Inez Catalina M.D.   On: 01/14/2016 14:00   Dg Elbow Complete Left 01/14/2016  Marked soft tissue swelling about the olecranon process without associated fracture, radiopaque foreign body or elbow joint effusion. Electronically Signed   By: Sandi Mariscal M.D.   On: 01/14/2016 13:54   Ct Head Wo Contrast 01/14/2016  No evidence of acute intracranial abnormality. Mild atrophy and chronic small-vessel white matter ischemic changes. No evidence of acute cervical spine abnormality. Multilevel degenerative changes as described. Electronically Signed   By: Margarette Canada M.D.   On: 01/14/2016 13:03   Ct Cervical Spine Wo Contrast 01/14/2016   No evidence of acute intracranial abnormality. Mild atrophy and chronic small-vessel white matter ischemic changes. No evidence of acute cervical spine abnormality. Multilevel degenerative changes as described. Electronically Signed   By: Margarette Canada M.D.   On: 01/14/2016 13:03   Dg Hip Unilat With Pelvis 2-3 Views Left 01/14/2016  Bilateral pubic rami fractures worst on the left. No other focal abnormality is noted. Electronically Signed   By: Inez Catalina M.D.   On: 01/14/2016 14:02   Medical Consultants:  None   Other Consultants:  PT  IAnti-Infectives:   None    Leisa Lenz, MD  [facility] Pager [phone]  Time spent in minutes: 25 minutes  If 7PM-7AM, please contact night-coverage [email or web address] Password [email or web address] 01/16/2016, 10:56 AM      HPI/Subjective: No acute overnight events. Pain controlled.  Objective: Filed Vitals:   01/15/16 1638 01/15/16 2134 01/16/16 0609 01/16/16 0944  BP: 132/49 127/61 111/49 96/52  Pulse: 79 83 87 85  Temp:  99.4 F (37.4 C) 99.4 F (37.4 C) 98 F (36.7 C)  TempSrc:  Oral Oral Oral  Resp:  16 14 14   Height:      Weight:      SpO2:  98% 98% 99%    Intake/Output Summary (Last 24 hours) at 01/16/16 1056 Last data filed at 01/16/16 1000  Gross per 24 hour  Intake     716 ml  Output    925 ml  Net   -209 ml    Exam:   General:  Pt is not in acute distress  Cardiovascular: Rate controlled, S1/S2 appreciated   Respiratory: Cno wheezing, no crackles, no rhonchi  Abdomen: S(+) BS, non tender  Extremities: No swelling, palpable pulses   Neuro: Nonfocal  Data Reviewed: Basic Metabolic Panel:  Recent Labs Lab 01/14/16 1240  NA 137  K 4.0  CL 102  CO2 26  GLUCOSE 115*  BUN 16  CREATININE 0.98  CALCIUM 8.9   Liver Function Tests:  Recent Labs Lab 01/14/16 1240  AST 26  ALT 19  ALKPHOS 33*  BILITOT 1.3*  PROT 5.8*  ALBUMIN 3.7   No results for input(s): LIPASE, AMYLASE in the last 168 hours. No results for input(s): AMMONIA in the last 168 hours. CBC:  Recent Labs Lab 01/14/16 1240  WBC 9.4  NEUTROABS 7.7  HGB 12.2  HCT 37.4  MCV 94.4  PLT 169   Cardiac Enzymes: No results for input(s): CKTOTAL, CKMB, CKMBINDEX, TROPONINI in the last 168 hours. BNP: Invalid input(s): POCBNP CBG:  Recent Labs Lab 01/16/16 0939  GLUCAP 141*    No results found for this or any previous visit (from the past 240 hour(s)).   Scheduled Meds: . aspirin EC  81 mg Oral Daily  . calcium carbonate  1 tablet Oral Q breakfast  . clonazePAM  0.25 mg Oral BID  . clopidogrel  75 mg Oral Daily  . enoxaparin (LOVENOX) injection  40 mg Subcutaneous Q24H  . levothyroxine  50 mcg Oral QAC breakfast  . omega-3 acid ethyl esters  2 g Oral Daily  . sodium chloride flush  3 mL Intravenous Q12H

## 2016-01-16 NOTE — Progress Notes (Signed)
Physical Therapy Treatment Patient Details Name: Jasmine Torres MRN: [id number] DOB: November 14, 1925 Today's Date: 01/16/2016    History of Present Illness Jasmine Torres is an 82 y.o. female with a PMH of atrial fibrillation, hypertension, and TIA who was brought to the hospital via EMS after suffering from a fall at home.  She was walking down the steps when she lost her footing and fell to the floor    PT Comments    Pt progressing slowly and requires assist and increased time.  Very "sore all over" from her fall.  Prior pt was home alone and driving.  Significant mobility decline.  Took nearly 12 min to assist her OOB to amb 8 feet to bathroom.  Poor balance/rigid/fearful.  Assisted with hygiene in bathroom due to impaired balance.  Amb another 8 feet from BR to recliner with assist to push walker as pt had never used one before.  Significant mobility decline.    Follow Up Recommendations  SNF (if not then 24/7 home)     Equipment Recommendations  Rolling walker with 5" wheels;3in1 (PT)    Recommendations for Other Services       Precautions / Restrictions Precautions Precautions: Fall Restrictions Weight Bearing Restrictions: No Other Position/Activity Restrictions: WBAT per Dr. [name] (EDP consult in Dr. Quincy Sheehan notes)    Mobility  Bed Mobility Overal bed mobility: Needs Assistance Bed Mobility: Supine to Sit     Supine to sit: Mod assist;HOB elevated     General bed mobility comments: incr time, cues to self assist; assist to bring trunk and LEs.  difficulty self scooting.    Transfers Overall transfer level: Needs assistance Equipment used: Rolling walker (2 wheeled) Transfers: Sit to/from Stand Sit to Stand: Mod assist Stand pivot transfers: Mod assist       General transfer comment: increased time and 50% VC's on proper tech using walker and completing turns.  Pt c/o pain "all over" from her fall.   Ambulation/Gait Ambulation/Gait assistance: Mod  assist Ambulation Distance (Feet): 16 Feet (8 feet x 2 to and from BR) Assistive device: Rolling walker (2 wheeled) Gait Pattern/deviations: Step-to pattern;Decreased stance time - left;Trunk flexed;Decreased step length - right;Decreased step length - left Gait velocity: decreased   General Gait Details: increased, increased time to step and assist to properly maneuver walker as pt has never used one before.  Slow/unsteady/rigid/HIGH FALL RISK   Stairs            Wheelchair Mobility    Modified Rankin (Stroke Patients Only)       Balance                                    Cognition Arousal/Alertness: Awake/alert Behavior During Therapy: WFL for tasks assessed/performed Overall Cognitive Status: Within Functional Limits for tasks assessed                      Exercises      General Comments        Pertinent Vitals/Pain Pain Assessment: Faces Faces Pain Scale: Hurts a little bit Pain Location: L pelvic and "sore all over" Pain Descriptors / Indicators: Aching;Grimacing;Sore Pain Intervention(s): Monitored during session;Repositioned    Home Living                      Prior Function  PT Goals (current goals can now be found in the care plan section) Progress towards PT goals: Progressing toward goals    Frequency  Min 4X/week    PT Plan      Co-evaluation             End of Session Equipment Utilized During Treatment: Gait belt Activity Tolerance: Patient limited by fatigue;Patient limited by pain Patient left: in chair;with call bell/phone within reach;with family/visitor present;with chair alarm set     Time: 1040-1110 PT Time Calculation (min) (ACUTE ONLY): 30 min  Charges:  $Gait Training: 8-22 mins $Therapeutic Activity: 8-22 mins                    G Codes:      Rica Koyanagi  PTA [facility]  Acute  Rehab Pager      [phone]

## 2016-01-16 NOTE — Care Management Obs Status (Signed)
[place] NOTIFICATION   Patient Details  Name: Jasmine Torres MRN: [id number] Date of Birth: 10/09/1925   Medicare Observation Status Notification Given:  Yes    Guadalupe Maple, RN 01/16/2016, 11:37 AM

## 2016-01-17 DIAGNOSIS — E038 Other specified hypothyroidism: Secondary | ICD-10-CM | POA: Diagnosis not present

## 2016-01-17 DIAGNOSIS — I482 Chronic atrial fibrillation: Secondary | ICD-10-CM | POA: Diagnosis not present

## 2016-01-17 DIAGNOSIS — I10 Essential (primary) hypertension: Secondary | ICD-10-CM | POA: Diagnosis not present

## 2016-01-17 DIAGNOSIS — G3183 Dementia with Lewy bodies: Secondary | ICD-10-CM

## 2016-01-17 DIAGNOSIS — S32501S Unspecified fracture of right pubis, sequela: Secondary | ICD-10-CM | POA: Diagnosis not present

## 2016-01-17 DIAGNOSIS — F0280 Dementia in other diseases classified elsewhere without behavioral disturbance: Secondary | ICD-10-CM

## 2016-01-17 MED ORDER — ACETAMINOPHEN 325 MG PO TABS: 650.0000 mg | ORAL_TABLET | Freq: Four times a day (QID) | ORAL | Status: AC | PRN

## 2016-01-17 NOTE — Discharge Instructions (Signed)
Confusion Confusion is the inability to think with your usual speed or clarity. Confusion may come on quickly or slowly over time. How quickly the confusion comes on depends on the cause. Confusion can be due to any number of causes. CAUSES   Concussion, head injury, or head trauma.  Seizures.  Stroke.  Fever.  Brain tumor.  Age related decreased brain function (dementia).  Heightened emotional states like rage or terror.  Mental illness in which the person loses the ability to determine what is real and what is not (hallucinations).  Infections such as a urinary tract infection (UTI).  Toxic effects from alcohol, drugs, or prescription medicines.  Dehydration and an imbalance of salts in the body (electrolytes).  Lack of sleep.  Low blood sugar (diabetes).  Low levels of oxygen from conditions such as chronic lung disorders.  Drug interactions or other medicine side effects.  Nutritional deficiencies, especially niacin, thiamine, vitamin C, or vitamin B.  Sudden drop in body temperature (hypothermia).  Change in routine, such as when traveling or hospitalized. SIGNS AND SYMPTOMS  People often describe their thinking as cloudy or unclear when they are confused. Confusion can also include feeling disoriented. That means you are unaware of where or who you are. You may also not know what the date or time is. If confused, you may also have difficulty paying attention, remembering, and making decisions. Some people also act aggressively when they are confused.  DIAGNOSIS  The medical evaluation of confusion may include:  Blood and urine tests.  X-rays.  Brain and nervous system tests.  Analyzing your brain waves (electroencephalogram or EEG).  Magnetic resonance imaging (MRI) of your head.  Computed tomography (CT) scan of your head.  Mental status tests in which your health care provider may ask many questions. Some of these questions may seem silly or strange,  but they are a very important test to help diagnose and treat confusion. TREATMENT  An admission to the hospital may not be needed, but a person with confusion should not be left alone. Stay with a family member or friend until the confusion clears. Avoid alcohol, pain relievers, or sedative drugs until you have fully recovered. Do not drive until directed by your health care provider. HOME CARE INSTRUCTIONS  What family and friends can do:  To find out if someone is confused, ask the person to state his or her name, age, and the date. If the person is unsure or answers incorrectly, he or she is confused.  Always introduce yourself, no matter how well the person knows you.  Often remind the person of his or her location.  Place a calendar and clock near the confused person.  Help the person with his or her medicines. You may want to use a pill box, an alarm as a reminder, or give the person each dose as prescribed.  Talk about current events and plans for the day.  Try to keep the environment calm, quiet, and peaceful.  Make sure the person keeps follow-up visits with his or her health care provider. PREVENTION  Ways to prevent confusion:  Avoid alcohol.  Eat a balanced diet.  Get enough sleep.  Take medicine only as directed by your health care provider.  Do not become isolated. Spend time with other people and make plans for your days.  Keep careful watch on your blood sugar levels if you are diabetic. SEEK IMMEDIATE MEDICAL CARE IF:   You develop severe headaches, repeated vomiting, seizures, blackouts, or  slurred speech.  There is increasing confusion, weakness, numbness, restlessness, or personality changes.  You develop a loss of balance, have marked dizziness, feel uncoordinated, or fall.  You have delusions, hallucinations, or develop severe anxiety.  Your family members think you need to be rechecked.   This information is not intended to replace advice given  to you by your health care provider. Make sure you discuss any questions you have with your health care provider.   Document Released: 01/10/2005 Document Revised: 12/24/2014 Document Reviewed: 01/08/2014 Elsevier Interactive Patient Education 2016 [place] Prevention in the Home  Falls can cause injuries and can affect people from all age groups. There are many simple things that you can do to make your home safe and to help prevent falls. WHAT CAN I DO ON THE OUTSIDE OF MY HOME?  Regularly repair the edges of walkways and driveways and fix any cracks.  Remove high doorway thresholds.  Trim any shrubbery on the main path into your home.  Use bright outdoor lighting.  Clear walkways of debris and clutter, including tools and rocks.  Regularly check that handrails are securely fastened and in good repair. Both sides of any steps should have handrails.  Install guardrails along the edges of any raised decks or porches.  Have leaves, snow, and ice cleared regularly.  Use sand or salt on walkways during winter months.  In the garage, clean up any spills right away, including grease or oil spills. WHAT CAN I DO IN THE BATHROOM?  Use night lights.  Install grab bars by the toilet and in the tub and shower. Do not use towel bars as grab bars.  Use non-skid mats or decals on the floor of the tub or shower.  If you need to sit down while you are in the shower, use a plastic, non-slip stool.Marland Kitchen  Keep the floor dry. Immediately clean up any water that spills on the floor.  Remove soap buildup in the tub or shower on a regular basis.  Attach bath mats securely with double-sided non-slip rug tape.  Remove throw rugs and other tripping hazards from the floor. WHAT CAN I DO IN THE BEDROOM?  Use night lights.  Make sure that a bedside light is easy to reach.  Do not use oversized bedding that drapes onto the floor.  Have a firm chair that has side arms to use for getting  dressed.  Remove throw rugs and other tripping hazards from the floor. WHAT CAN I DO IN THE KITCHEN?   Clean up any spills right away.  Avoid walking on wet floors.  Place frequently used items in easy-to-reach places.  If you need to reach for something above you, use a sturdy step stool that has a grab bar.  Keep electrical cables out of the way.  Do not use floor polish or wax that makes floors slippery. If you have to use wax, make sure that it is non-skid floor wax.  Remove throw rugs and other tripping hazards from the floor. WHAT CAN I DO IN THE STAIRWAYS?  Do not leave any items on the stairs.  Make sure that there are handrails on both sides of the stairs. Fix handrails that are broken or loose. Make sure that handrails are as long as the stairways.  Check any carpeting to make sure that it is firmly attached to the stairs. Fix any carpet that is loose or worn.  Avoid having throw rugs at the top or bottom of stairways,  or secure the rugs with carpet tape to prevent them from moving.  Make sure that you have a light switch at the top of the stairs and the bottom of the stairs. If you do not have them, have them installed. WHAT ARE SOME OTHER FALL PREVENTION TIPS?  Wear closed-toe shoes that fit well and support your feet. Wear shoes that have rubber soles or low heels.  When you use a stepladder, make sure that it is completely opened and that the sides are firmly locked. Have someone hold the ladder while you are using it. Do not climb a closed stepladder.  Add color or contrast paint or tape to grab bars and handrails in your home. Place contrasting color strips on the first and last steps.  Use mobility aids as needed, such as canes, walkers, scooters, and crutches.  Turn on lights if it is dark. Replace any light bulbs that burn out.  Set up furniture so that there are clear paths. Keep the furniture in the same spot.  Fix any uneven floor surfaces.  Choose a  carpet design that does not hide the edge of steps of a stairway.  Be aware of any and all pets.  Review your medicines with your healthcare provider. Some medicines can cause dizziness or changes in blood pressure, which increase your risk of falling. Talk with your health care provider about other ways that you can decrease your risk of falls. This may include working with a physical therapist or trainer to improve your strength, balance, and endurance.   This information is not intended to replace advice given to you by your health care provider. Make sure you discuss any questions you have with your health care provider.   Document Released: 11/23/2002 Document Revised: 04/19/2015 Document Reviewed: 01/07/2015 Elsevier Interactive Patient Education [employer].

## 2016-01-17 NOTE — NC FL2 (Signed)
[place] LEVEL OF CARE SCREENING TOOL     IDENTIFICATION  Patient Name: Jasmine Torres Birthdate: 13-Oct-1925 Sex: female Admission Date (Current Location): 01/14/2016  [facility] and [place] Number:  [employer] and Address:  [facility],  [place] [address], [place]      Provider Number: [id number]  Attending Physician Name and Address:  Robbie Lis, MD  Relative Name and Phone Number:       Current Level of Care: Hospital Recommended Level of Care: [place] Prior Approval Number:    Date Approved/Denied:   PASRR Number: [id number]  Discharge Plan: SNF    Current Diagnoses: Patient Active Problem List   Diagnosis Date Noted  . Bilateral pubic rami fractures ([place]) 01/14/2016  . Precordial pain 04/15/2014  . Chronic atrial fibrillation ([place]) 06/03/2013  . Hypothyroidism 03/26/2012  . Essential hypertension, benign 03/27/2010  . HYPERLIPIDEMIA-MIXED 10/03/2009    Orientation RESPIRATION BLADDER Height & Weight     Self (easily reoriented)  Normal Continent Weight: 127 lb (57.607 kg) Height:  5\' 6"  (167.6 cm)  BEHAVIORAL SYMPTOMS/MOOD NEUROLOGICAL BOWEL NUTRITION STATUS   (n/a)  (NONE) Continent Diet (Diet Regular)  AMBULATORY STATUS COMMUNICATION OF NEEDS Skin   Limited Assist Verbally Normal                       Personal Care Assistance Level of Assistance  Bathing, Feeding, Dressing Bathing Assistance: Limited assistance Feeding assistance: Independent Dressing Assistance: Limited assistance     Functional Limitations Info  Sight, Hearing, Speech Sight Info: Adequate Hearing Info: Adequate Speech Info: Adequate    SPECIAL CARE FACTORS FREQUENCY  PT (By licensed PT), OT (By licensed OT)     PT Frequency: 5 x a week OT Frequency: 5 x a week            Contractures Contractures Info: Not present    Additional Factors Info  Code Status, Allergies Code Status Info: FULL code  status Allergies Info: Penicillins           Current Medications (01/17/2016):  This is the current hospital active medication list Current Facility-Administered Medications  Medication Dose Route Frequency Provider Last Rate Last Dose  . 0.9 %  sodium chloride infusion  250 mL Intravenous PRN Venetia Maxon Rama, MD      . acetaminophen (TYLENOL) tablet 650 mg  650 mg Oral Q6H PRN Venetia Maxon Rama, MD   650 mg at 01/16/16 2058   Or  . acetaminophen (TYLENOL) suppository 650 mg  650 mg Rectal Q6H PRN Venetia Maxon Rama, MD      . alum & mag hydroxide-simeth (MAALOX/MYLANTA) 200-200-20 MG/5ML suspension 30 mL  30 mL Oral Q6H PRN Venetia Maxon Rama, MD   30 mL at 01/16/16 0400  . aspirin EC tablet 81 mg  81 mg Oral Daily Venetia Maxon Rama, MD   81 mg at 01/16/16 1013  . calcium carbonate (OS-CAL - dosed in mg of elemental calcium) tablet 500 mg of elemental calcium  1 tablet Oral Q breakfast Venetia Maxon Rama, MD   500 mg of elemental calcium at 01/16/16 0752  . clonazePAM (KLONOPIN) tablet 0.25 mg  0.25 mg Oral BID Venetia Maxon Rama, MD   0.25 mg at 01/16/16 2046  . clopidogrel (PLAVIX) tablet 75 mg  75 mg Oral Daily Venetia Maxon Rama, MD   75 mg at 01/16/16 1013  . enoxaparin (LOVENOX) injection 40 mg  40 mg  Subcutaneous Q24H Venetia Maxon Rama, MD   40 mg at 01/16/16 2046  . levothyroxine (SYNTHROID, LEVOTHROID) tablet 50 mcg  50 mcg Oral QAC breakfast Venetia Maxon Rama, MD   50 mcg at 01/16/16 0752  . methocarbamol (ROBAXIN) tablet 500 mg  500 mg Oral Q8H PRN Robbie Lis, MD   500 mg at 01/16/16 1453  . omega-3 acid ethyl esters (LOVAZA) capsule 2 g  2 g Oral Daily Venetia Maxon Rama, MD   2 g at 01/16/16 1014  . ondansetron (ZOFRAN) tablet 4 mg  4 mg Oral Q6H PRN Venetia Maxon Rama, MD       Or  . ondansetron (ZOFRAN) injection 4 mg  4 mg Intravenous Q6H PRN Christina P Rama, MD      . oxyCODONE (Oxy IR/ROXICODONE) immediate release tablet 5 mg  5 mg Oral Q4H PRN Venetia Maxon Rama, MD   5 mg at 01/16/16  2058  . pantoprazole (PROTONIX) EC tablet 20 mg  20 mg Oral Daily PRN Christina P Rama, MD      . polyethylene glycol (MIRALAX / GLYCOLAX) packet 17 g  17 g Oral Daily PRN Christina P Rama, MD      . sodium chloride flush (NS) 0.9 % injection 3 mL  3 mL Intravenous Q12H Venetia Maxon Rama, MD   3 mL at 01/16/16 2107  . sodium chloride flush (NS) 0.9 % injection 3 mL  3 mL Intravenous PRN Venetia Maxon Rama, MD         Discharge Medications: Please see discharge summary for a list of discharge medications.  Relevant Imaging Results:  Relevant Lab Results:   Additional Information SSN: [id number]  KIDD, SUZANNA A, LCSW

## 2016-01-17 NOTE — Clinical Social Work Note (Signed)
Clinical Social Work Assessment  Patient Details  Name: Jasmine Torres MRN: [id number] Date of Birth: 1925/06/10  Date of referral:  01/17/16               Reason for consult:  Discharge Planning                Permission sought to share information with:  Family Supports Permission granted to share information::  Yes, Verbal Permission Granted  Name::     Deveron Furlong  Agency::     Relationship::  daughter  Contact Information:     Housing/Transportation Living arrangements for the past 2 months:  Single Family Home Source of Information:  Adult Children Patient Interpreter Needed:  None Criminal Activity/Legal Involvement Pertinent to Current Situation/Hospitalization:  No - Comment as needed Significant Relationships:  Adult Children Lives with:  Adult Children Do you feel safe going back to the place where you live?  No Need for family participation in patient care:  Yes (Comment)  Care giving concerns:  Pt admitted from home with pt daughter. Pt daughter does not feel that she can manage pt care at home. Pt daughter agreeable to pay privately at [facility] as pt does not meet inpatient criteria for Medicare to cover SNF stay.    Social Worker assessment / plan:    CSW received referral for new SNF. Pt admitted as observation and per [facility] pt daughter agreeable to pay privately at [facility] for short term stay.  CSW met with pt daughter at bedside. Pt daughter provided permission to initiate SNF search for private pay options. Pt daughter interested in [facility]. CSW clarified pt questions and concerns.  CSW completed FL2 and initiated SNF search to [facility]. CSW contacted [facility] and facility reviewed information and notified CSW that facility has private pay shared room available today.   CSW provided options to pt daughter and pt daughter chooses [facility] [place] for private pay options.   CSW to facilitate pt discharge needs to [place] [facility]  [place].   Employment status:  Retired Forensic scientist:  Medicare PT Recommendations:  [place] / Referral to community resources:  [place]  Patient/Family's Response to care:  Pt oriented to person and place, but pt sleeping at this time. Pt daughter supportive and actively involved in pt care. Pt daughter grateful that [facility] is an option for pt.   Patient/Family's Understanding of and Emotional Response to Diagnosis, Current Treatment, and Prognosis:  Pt daughter displayed knowledge surrounding pt care and recommendation for rehab.  Emotional Assessment Appearance:  Appears stated age Attitude/Demeanor/Rapport:  Unable to Assess Affect (typically observed):  Quiet Orientation:  Oriented to Self, Oriented to Place Alcohol / Substance use:  Not Applicable Psych involvement (Current and /or in the community):  No (Comment)  Discharge Needs  Concerns to be addressed:  Discharge Planning Concerns Readmission within the last 30 days:  No Current discharge risk:  None Barriers to Discharge:  No Barriers Identified   [place], [place], LCSW 01/17/2016, 2:19 PM  [phone]

## 2016-01-17 NOTE — Progress Notes (Signed)
Report called to [place] at [facility]. Pt ia alert to self and place at times. VSS, skin intact although has severe bruising to left elbow and multiple scattered bruises threw out, pain controlled. Pts daughter to meet her at the facility.

## 2016-01-17 NOTE — Discharge Summary (Signed)
Physician Discharge Summary  [name] [id number] DOB: 1925/03/10 DOA: 01/14/2016  PCP: [name], MD  Admit date: 01/14/2016 Discharge date: 01/17/2016  Recommendations for Outpatient Follow-up:  1. Please avoid narcotics and benzos for elderly patients as it may make them more confused 2. Patient was on metoprolol 25 mg twice daily but we held it because of hypotension. Please reevaluate in skilled nursing facility if she needs to be on blood pressure medications and if she does metoprolol is what she was taking. For now would hold off blood pressure medications.  Discharge Diagnoses:  Principal Problem:   Bilateral pubic rami fractures (HCC) Active Problems:   Essential hypertension, benign   Hypothyroidism   Chronic atrial fibrillation (HCC)    Discharge Condition: stable   Diet recommendation: as tolerated   History of present illness:  82 y.o. female with a past medical history significant for atrial fibrillation on anticoagulation with aspirin, hypertension, dyslipidemia, hypothyroidism who presented to [facility] long hospital status post fall at home. No reports of loss of consciousness. Patient was found to have pubic rami fracture. No acute intracranial findings and CT head and cervical spine. No fractures in thoracic and lumbar spine. No acute cardiopulmonary findings on chest x-ray.  Hospital Course:   Assessment/Plan:    Principal Problem:  Bilateral pubic rami fractures ([place]) - Status post fall - Per PT - placement to SNF  Active Problems:   Dementia - No evidence of stroke on CT head or MRI   Essential hypertension, benign - Hold metoprolol 25 mg 3 times a day and Aldactone 25 mg daily due to hypotension   Hypothyroidism - Continue Synthroid 50 mcgdaily   Chronic atrial fibrillation (HCC) - CHADS vasc score 5 (Age, gender, HTN, history of TIA) - Continue anticoagulation with aspirin and Plavix - Metoprolol on hold due to hypotension     Dyslipidemia - Continue omega-3 supplementation  DVT Prophylaxis  - Lovenox subQ in hospital - Aspirin on discharge   Code Status: Full.  Family Communication: Daughter at the bedside    IV access:  Peripheral IV  Procedures and diagnostic studies:   Dg Chest 2 View 01/14/2016 No active cardiopulmonary disease. Electronically Signed By: Inez Catalina M.D. On: 01/14/2016 14:03   Dg Thoracic Spine 2 View 01/14/2016 No acute bony abnormality noted. Electronically Signed By: Inez Catalina M.D. On: 01/14/2016 13:54   Dg Lumbar Spine Complete 01/14/2016 Mild degenerative change without acute abnormality per Electronically Signed By: Inez Catalina M.D. On: 01/14/2016 14:00   Dg Elbow Complete Left 01/14/2016 Marked soft tissue swelling about the olecranon process without associated fracture, radiopaque foreign body or elbow joint effusion. Electronically Signed By: Sandi Mariscal M.D. On: 01/14/2016 13:54   Ct Head Wo Contrast 01/14/2016 No evidence of acute intracranial abnormality. Mild atrophy and chronic small-vessel white matter ischemic changes. No evidence of acute cervical spine abnormality. Multilevel degenerative changes as described. Electronically Signed By: Margarette Canada M.D. On: 01/14/2016 13:03   Ct Cervical Spine Wo Contrast 01/14/2016 No evidence of acute intracranial abnormality. Mild atrophy and chronic small-vessel white matter ischemic changes. No evidence of acute cervical spine abnormality. Multilevel degenerative changes as described. Electronically Signed By: Margarette Canada M.D. On: 01/14/2016 13:03   Dg Hip Unilat With Pelvis 2-3 Views Left 01/14/2016 Bilateral pubic rami fractures worst on the left. No other focal abnormality is noted. Electronically Signed By: Inez Catalina M.D. On: 01/14/2016 14:02   Medical Consultants:  None   Other Consultants:  PT  IAnti-Infectives:   None  SignedLeisa Lenz, MD  [facility] 01/17/2016, 10:34 AM  Pager #: [phone]  Time spent in minutes: more than 30 minutes  Discharge Exam: Filed Vitals:   01/16/16 2204 01/17/16 0517  BP: 119/40 94/42  Pulse: 92 75  Temp: 98.8 F (37.1 C) 99.6 F (37.6 C)  Resp: 16 16   Filed Vitals:   01/16/16 0944 01/16/16 1400 01/16/16 2204 01/17/16 0517  BP: 96/52 112/43 119/40 94/42  Pulse: 85 79 92 75  Temp: 98 F (36.7 C) 99 F (37.2 C) 98.8 F (37.1 C) 99.6 F (37.6 C)  TempSrc: Oral Oral Oral Axillary  Resp: 14 16 16 16   Height:      Weight:      SpO2: 99% 88% 91% 91%    General: Pt is not in acute distress Cardiovascular: Regular rate and rhythm, S1/S2 + Respiratory: Clear to auscultation bilaterally, no wheezing, no crackles, no rhonchi Abdominal: Soft, non tender, non distended, bowel sounds +, no guarding Extremities: no edema, no cyanosis, pulses palpable bilaterally DP and PT Neuro: Grossly nonfocal  Discharge Instructions  Discharge Instructions    Call MD for:  difficulty breathing, headache or visual disturbances    Complete by:  As directed      Call MD for:  persistant dizziness or light-headedness    Complete by:  As directed      Call MD for:  persistant nausea and vomiting    Complete by:  As directed      Call MD for:  severe uncontrolled pain    Complete by:  As directed      Diet - low sodium heart healthy    Complete by:  As directed      Discharge instructions    Complete by:  As directed   1. Please avoid narcotics and benzos for elderly patients as it may make them more confused 2. Patient was on metoprolol 25 mg twice daily but we held it because of hypotension. Please reevaluate in skilled nursing facility if she needs to be on blood pressure medications and if she does metoprolol is what she was taking. For now would hold off blood pressure medications.     Increase activity slowly    Complete by:  As directed             Medication List    STOP taking  these medications        clonazePAM 0.5 MG tablet  Commonly known as:  KLONOPIN     metoprolol tartrate 25 MG tablet  Commonly known as:  LOPRESSOR     nitroGLYCERIN 0.4 MG SL tablet  Commonly known as:  NITROSTAT     spironolactone 25 MG tablet  Commonly known as:  ALDACTONE      TAKE these medications        acetaminophen 325 MG tablet  Commonly known as:  TYLENOL  Take 2 tablets (650 mg total) by mouth every 6 (six) hours as needed for mild pain (or Fever >/= 101).     aspirin EC 81 MG tablet  Take 81 mg by mouth daily.     CALCIUM MAGNESIUM PO  Take 1 tab by mouth every morning & 2 tabs at bedtime     clopidogrel 75 MG tablet  Commonly known as:  PLAVIX  Take 75 mg by mouth daily.     Fish Oil 1200 MG Caps  Take 2 capsules by mouth every morning.     levothyroxine 50  MCG tablet  Commonly known as:  SYNTHROID, LEVOTHROID  Take 50 mcg by mouth daily before breakfast.     pantoprazole 20 MG tablet  Commonly known as:  PROTONIX  Take 20 mg by mouth daily as needed for heartburn.           Follow-up Information    Follow up with [facility], [place]. Schedule an appointment as soon as possible for a visit in 1 week.   Specialty:  Internal Medicine   Why:  Follow up appt after recent hospitalization   Contact information:   [place] [address] [phone]        The results of significant diagnostics from this hospitalization (including imaging, microbiology, ancillary and laboratory) are listed below for reference.    Significant Diagnostic Studies: Dg Chest 2 View  01/14/2016  CLINICAL DATA:  Fall from bottom step with chest pain, initial encounter EXAM: CHEST  2 VIEW COMPARISON:  06/05/2015 FINDINGS: Cardiac shadow is within normal limits. The lungs are well aerated bilaterally. No focal infiltrate or sizable effusion is seen. No acute bony abnormality is noted. IMPRESSION: No active cardiopulmonary disease. Electronically Signed   By: Inez Catalina M.D.   On: 01/14/2016 14:03   Dg Thoracic Spine 2 View  01/14/2016  CLINICAL DATA:  Fall from stairs with upper back pain, initial encounter EXAM: THORACIC SPINE 2 VIEWS COMPARISON:  None. FINDINGS: Vertebral body height is well maintained. Mild osteophytic changes are seen. The pedicles are within normal limits and no paraspinal mass lesion is noted. Biapical pleural parenchymal scarring is seen. IMPRESSION: No acute bony abnormality noted. Electronically Signed   By: Inez Catalina M.D.   On: 01/14/2016 13:54   Dg Lumbar Spine Complete  01/14/2016  CLINICAL DATA:  Fall from stairs with low back pain, initial encounter EXAM: LUMBAR SPINE - COMPLETE 4+ VIEW COMPARISON:  11/20/2005 FINDINGS: Five lumbar type vertebral bodies are well visualized. Vertebral body height is well maintained. Mild osteophytic changes are seen. No spondylolisthesis is noted. No other focal abnormality is seen. IMPRESSION: Mild degenerative change without acute abnormality per Electronically Signed   By: Inez Catalina M.D.   On: 01/14/2016 14:00   Dg Elbow Complete Left  01/14/2016  CLINICAL DATA:  [place] off bottom step now with left elbow pain. EXAM: LEFT ELBOW - COMPLETE 3+ VIEW COMPARISON:  None. FINDINGS: There is marked soft tissue swelling about the olecranon process, however this finding is without associated fracture, radiopaque foreign body or elbow joint effusion. Joint spaces are preserved. IMPRESSION: Marked soft tissue swelling about the olecranon process without associated fracture, radiopaque foreign body or elbow joint effusion. Electronically Signed   By: Sandi Mariscal M.D.   On: 01/14/2016 13:54   Ct Head Wo Contrast  01/14/2016  CLINICAL DATA:  82 year old female with dizziness and acute fall. EXAM: CT HEAD WITHOUT CONTRAST CT CERVICAL SPINE WITHOUT CONTRAST TECHNIQUE: Multidetector CT imaging of the head and cervical spine was performed following the standard protocol without intravenous contrast. Multiplanar  CT image reconstructions of the cervical spine were also generated. COMPARISON:  12/22/2015 and prior head CTs FINDINGS: CT HEAD FINDINGS Mild generalized cerebral volume loss and very mild chronic small-vessel white matter ischemic changes again noted. An enlarged perivascular space just inferior to the left basal ganglia again noted. No acute intracranial abnormalities are identified, including mass lesion or mass effect, hydrocephalus, extra-axial fluid collection, midline shift, hemorrhage, or acute infarction. The visualized bony calvarium is unremarkable. CT CERVICAL SPINE  FINDINGS There is no evidence of acute fracture or prevertebral soft tissue swelling. 1.5 mm anterolisthesis of C5 on C6 is likely degenerative. Mild-moderate degenerative disc disease and spondylosis is C5-6 and C6-7 noted. Moderate multilevel facet arthropathy is identified. No focal no focal bony lesions are noted. Biapical pleuroparenchymal scarring is present. IMPRESSION: No evidence of acute intracranial abnormality. Mild atrophy and chronic small-vessel white matter ischemic changes. No evidence of acute cervical spine abnormality. Multilevel degenerative changes as described. Electronically Signed   By: Margarette Canada M.D.   On: 01/14/2016 13:03   Ct Head Wo Contrast  12/22/2015  CLINICAL DATA:  Incoherent speech, TIA EXAM: CT HEAD WITHOUT CONTRAST TECHNIQUE: Contiguous axial images were obtained from the base of the skull through the vertex without intravenous contrast. COMPARISON:  06/05/2015 FINDINGS: There is no evidence of mass effect, midline shift, or extra-axial fluid collections. There is no evidence of a space-occupying lesion or intracranial hemorrhage. There is no evidence of a cortical-based area of acute infarction. There is a large left subinsular perivascular space. There is generalized cerebral atrophy. There is periventricular white matter low attenuation likely secondary to microangiopathy. The ventricles and sulci  are appropriate for the patient's age. The basal cisterns are patent. Visualized portions of the orbits are unremarkable. The visualized portions of the paranasal sinuses and mastoid air cells are unremarkable. The osseous structures are unremarkable. IMPRESSION: No acute intracranial pathology. Electronically Signed   By: Kathreen Devoid   On: 12/22/2015 15:08   Ct Cervical Spine Wo Contrast  01/14/2016  CLINICAL DATA:  82 year old female with dizziness and acute fall. EXAM: CT HEAD WITHOUT CONTRAST CT CERVICAL SPINE WITHOUT CONTRAST TECHNIQUE: Multidetector CT imaging of the head and cervical spine was performed following the standard protocol without intravenous contrast. Multiplanar CT image reconstructions of the cervical spine were also generated. COMPARISON:  12/22/2015 and prior head CTs FINDINGS: CT HEAD FINDINGS Mild generalized cerebral volume loss and very mild chronic small-vessel white matter ischemic changes again noted. An enlarged perivascular space just inferior to the left basal ganglia again noted. No acute intracranial abnormalities are identified, including mass lesion or mass effect, hydrocephalus, extra-axial fluid collection, midline shift, hemorrhage, or acute infarction. The visualized bony calvarium is unremarkable. CT CERVICAL SPINE FINDINGS There is no evidence of acute fracture or prevertebral soft tissue swelling. 1.5 mm anterolisthesis of C5 on C6 is likely degenerative. Mild-moderate degenerative disc disease and spondylosis is C5-6 and C6-7 noted. Moderate multilevel facet arthropathy is identified. No focal no focal bony lesions are noted. Biapical pleuroparenchymal scarring is present. IMPRESSION: No evidence of acute intracranial abnormality. Mild atrophy and chronic small-vessel white matter ischemic changes. No evidence of acute cervical spine abnormality. Multilevel degenerative changes as described. Electronically Signed   By: Margarette Canada M.D.   On: 01/14/2016 13:03   Mr  Brain Wo Contrast  01/16/2016  CLINICAL DATA:  82 year old female with confusion today after fall. Initial encounter. EXAM: MRI HEAD WITHOUT CONTRAST TECHNIQUE: Multiplanar, multiecho pulse sequences of the brain and surrounding structures were obtained without intravenous contrast. COMPARISON:  Head and cervical spine CT 01/14/2016. [facility] brain MRI 02/28/2012 Report (no images available). FINDINGS: No restricted diffusion to suggest acute infarction. No midline shift, mass effect, evidence of mass lesion, ventriculomegaly, extra-axial collection or acute intracranial hemorrhage. Cervicomedullary junction and pituitary are within normal limits. Major intracranial vascular flow voids are preserved with intracranial artery dolichoectasia. Incidental inferior left basal ganglia dilated perivascular space (normal variant). Pearline Cables and white matter signal  is within normal limits for age throughout the brain. No cortical encephalomalacia or chronic cerebral blood products. Visible internal auditory structures appear normal. Trace left mastoid fluid, appears inconsequential. Negative nasopharynx. Negative paranasal sinuses. Postoperative changes to both globes. Negative scalp soft tissues. Normal bone marrow signal. Negative visualized cervical spine. IMPRESSION: No acute intracranial abnormality. Negative for age noncontrast MRI appearance of the brain. Electronically Signed   By: Genevie Ann M.D.   On: 01/16/2016 20:21   Dg Hip Unilat With Pelvis 2-3 Views Left  01/14/2016  CLINICAL DATA:  Fall from stairs with left hip pain, initial encounter EXAM: DG HIP (WITH OR WITHOUT PELVIS) 2-3V LEFT COMPARISON:  None. FINDINGS: Pelvic ring demonstrates fractures through the pubic rami bilaterally involving predominately the right superior and left superior and inferior pubic rami. The proximal femur is within normal limits. No gross soft tissue abnormality is seen. IMPRESSION: Bilateral pubic rami fractures  worst on the left. No other focal abnormality is noted. Electronically Signed   By: Inez Catalina M.D.   On: 01/14/2016 14:02    Microbiology: No results found for this or any previous visit (from the past 240 hour(s)).   Labs: Basic Metabolic Panel:  Recent Labs Lab 01/14/16 1240  NA 137  K 4.0  CL 102  CO2 26  GLUCOSE 115*  BUN 16  CREATININE 0.98  CALCIUM 8.9   Liver Function Tests:  Recent Labs Lab 01/14/16 1240  AST 26  ALT 19  ALKPHOS 33*  BILITOT 1.3*  PROT 5.8*  ALBUMIN 3.7   No results for input(s): LIPASE, AMYLASE in the last 168 hours. No results for input(s): AMMONIA in the last 168 hours. CBC:  Recent Labs Lab 01/14/16 1240  WBC 9.4  NEUTROABS 7.7  HGB 12.2  HCT 37.4  MCV 94.4  PLT 169   Cardiac Enzymes: No results for input(s): CKTOTAL, CKMB, CKMBINDEX, TROPONINI in the last 168 hours. BNP: BNP (last 3 results) No results for input(s): BNP in the last 8760 hours.  ProBNP (last 3 results) No results for input(s): PROBNP in the last 8760 hours.  CBG:  Recent Labs Lab 01/16/16 0939  GLUCAP 141*

## 2016-01-17 NOTE — Progress Notes (Signed)
Pt for discharge to [facility] as private pay.  CSW facilitated pt discharge needs including contacting facility, faxing pt discharge information via [employer], discussing with pt daughter at bedside, providing RN phone number to call report, and arranging ambulance transport for pt to [facility] [place].   Pt daughter appreciative of assistance. Pt daughter grateful that [facility] able to accept pt.   No further social work needs identified at this time.  CSW signing off.   Alison Murray, MSW, [place] Work [phone]

## 2016-01-17 NOTE — Clinical Social Work Placement (Signed)
   CLINICAL SOCIAL WORK PLACEMENT  NOTE  Date:  01/17/2016  Patient Details  Name: Jasmine Torres MRN: [id number] Date of Birth: 1925/06/06  Clinical Social Work is seeking post-discharge placement for this patient at the [place] level of care (*CSW will initial, date and re-position this form in  chart as items are completed):  Yes   Patient/family provided with [place] Work Department's list of facilities offering this level of care within the geographic area requested by the patient (or if unable, by the patient's family).  Yes   Patient/family informed of their freedom to choose among providers that offer the needed level of care, that participate in Medicare, Medicaid or managed care program needed by the patient, have an available bed and are willing to accept the patient.  Yes   Patient/family informed of [facility]'s ownership interest in [facility] and [facility], as well as of the fact that they are under no obligation to receive care at these facilities.  PASRR submitted to EDS on 01/17/16     PASRR number received on 01/17/16     Existing PASRR number confirmed on       FL2 transmitted to all facilities in geographic area requested by pt/family on 01/17/16     FL2 transmitted to all facilities within larger geographic area on       Patient informed that his/her managed care company has contracts with or will negotiate with certain facilities, including the following:        Yes   Patient/family informed of bed offers received.  Patient chooses bed at [facility] recommends and patient chooses bed at      Patient to be transferred to [facility] [place] on 01/17/16.  Patient to be transferred to facility by ambulance [facility])     Patient family notified on 01/17/16 of transfer.  Name of family member notified:  pt daughter, [name] notified at bedside     PHYSICIAN Please sign FL2      Additional Comment:    _______________________________________________ Ladell Pier, LCSW 01/17/2016, 1:05 PM

## 2016-01-17 NOTE — Clinical Social Work Placement (Signed)
   CLINICAL SOCIAL WORK PLACEMENT  NOTE  Date:  01/17/2016  Patient Details  Name: Jasmine Torres MRN: [id number] Date of Birth: Apr 19, 1925  Clinical Social Work is seeking post-discharge placement for this patient at the [place] level of care (*CSW will initial, date and re-position this form in  chart as items are completed):  Yes   Patient/family provided with [facility] Work Department's list of facilities offering this level of care within the geographic area requested by the patient (or if unable, by the patient's family).  Yes   Patient/family informed of their freedom to choose among providers that offer the needed level of care, that participate in Medicare, Medicaid or managed care program needed by the patient, have an available bed and are willing to accept the patient.  Yes   Patient/family informed of [facility]'s ownership interest in [facility] and [facility], as well as of the fact that they are under no obligation to receive care at these facilities.  PASRR submitted to EDS on 01/17/16     PASRR number received on 01/17/16     Existing PASRR number confirmed on       FL2 transmitted to all facilities in geographic area requested by pt/family on 01/17/16     FL2 transmitted to all facilities within larger geographic area on       Patient informed that his/her managed care company has contracts with or will negotiate with certain facilities, including the following:        Yes   Patient/family informed of bed offers received.  Patient chooses bed at       Physician recommends and patient chooses bed at      Patient to be transferred to   on  .  Patient to be transferred to facility by       Patient family notified on   of transfer.  Name of family member notified:        PHYSICIAN Please sign FL2     Additional Comment:    _______________________________________________ Ladell Pier, LCSW 01/17/2016,  11:04 AM

## 2016-01-18 ENCOUNTER — Emergency Department (HOSPITAL_COMMUNITY): Payer: Medicare Other

## 2016-01-18 ENCOUNTER — Encounter (HOSPITAL_COMMUNITY): Payer: Self-pay

## 2016-01-18 ENCOUNTER — Inpatient Hospital Stay (HOSPITAL_COMMUNITY)
Admission: EM | Admit: 2016-01-18 | Discharge: 2016-01-21 | DRG: 394 | Disposition: A | Discharge status: Skilled Nursing Facility | Payer: Medicare Other | Attending: Internal Medicine | Admitting: Internal Medicine

## 2016-01-18 DIAGNOSIS — S32592A Other specified fracture of left pubis, initial encounter for closed fracture: Secondary | ICD-10-CM | POA: Diagnosis present

## 2016-01-18 DIAGNOSIS — F05 Delirium due to known physiological condition: Secondary | ICD-10-CM | POA: Diagnosis present

## 2016-01-18 DIAGNOSIS — I10 Essential (primary) hypertension: Secondary | ICD-10-CM | POA: Diagnosis present

## 2016-01-18 DIAGNOSIS — Z7901 Long term (current) use of anticoagulants: Secondary | ICD-10-CM

## 2016-01-18 DIAGNOSIS — I482 Chronic atrial fibrillation, unspecified: Secondary | ICD-10-CM | POA: Diagnosis present

## 2016-01-18 DIAGNOSIS — E039 Hypothyroidism, unspecified: Secondary | ICD-10-CM | POA: Diagnosis present

## 2016-01-18 DIAGNOSIS — Y939 Activity, unspecified: Secondary | ICD-10-CM | POA: Diagnosis not present

## 2016-01-18 DIAGNOSIS — K219 Gastro-esophageal reflux disease without esophagitis: Secondary | ICD-10-CM | POA: Diagnosis present

## 2016-01-18 DIAGNOSIS — Z8673 Personal history of transient ischemic attack (TIA), and cerebral infarction without residual deficits: Secondary | ICD-10-CM

## 2016-01-18 DIAGNOSIS — K661 Hemoperitoneum: Principal | ICD-10-CM | POA: Diagnosis present

## 2016-01-18 DIAGNOSIS — D696 Thrombocytopenia, unspecified: Secondary | ICD-10-CM | POA: Diagnosis present

## 2016-01-18 DIAGNOSIS — E038 Other specified hypothyroidism: Secondary | ICD-10-CM

## 2016-01-18 DIAGNOSIS — Z7982 Long term (current) use of aspirin: Secondary | ICD-10-CM

## 2016-01-18 DIAGNOSIS — Z880 Allergy status to penicillin: Secondary | ICD-10-CM

## 2016-01-18 DIAGNOSIS — Z85828 Personal history of other malignant neoplasm of skin: Secondary | ICD-10-CM | POA: Diagnosis not present

## 2016-01-18 DIAGNOSIS — W109XXA Fall (on) (from) unspecified stairs and steps, initial encounter: Secondary | ICD-10-CM | POA: Diagnosis present

## 2016-01-18 DIAGNOSIS — E861 Hypovolemia: Secondary | ICD-10-CM | POA: Diagnosis present

## 2016-01-18 DIAGNOSIS — N9489 Other specified conditions associated with female genital organs and menstrual cycle: Secondary | ICD-10-CM | POA: Diagnosis present

## 2016-01-18 DIAGNOSIS — S32501D Unspecified fracture of right pubis, subsequent encounter for fracture with routine healing: Secondary | ICD-10-CM | POA: Diagnosis not present

## 2016-01-18 DIAGNOSIS — Y92009 Unspecified place in unspecified non-institutional (private) residence as the place of occurrence of the external cause: Secondary | ICD-10-CM

## 2016-01-18 DIAGNOSIS — E785 Hyperlipidemia, unspecified: Secondary | ICD-10-CM | POA: Diagnosis present

## 2016-01-18 DIAGNOSIS — S32591D Other specified fracture of right pubis, subsequent encounter for fracture with routine healing: Secondary | ICD-10-CM

## 2016-01-18 DIAGNOSIS — S36892A Contusion of other intra-abdominal organs, initial encounter: Secondary | ICD-10-CM | POA: Diagnosis present

## 2016-01-18 DIAGNOSIS — D62 Acute posthemorrhagic anemia: Secondary | ICD-10-CM | POA: Diagnosis present

## 2016-01-18 DIAGNOSIS — I359 Nonrheumatic aortic valve disorder, unspecified: Secondary | ICD-10-CM | POA: Diagnosis present

## 2016-01-18 DIAGNOSIS — E871 Hypo-osmolality and hyponatremia: Secondary | ICD-10-CM | POA: Diagnosis present

## 2016-01-18 DIAGNOSIS — S32119A Unspecified Zone I fracture of sacrum, initial encounter for closed fracture: Secondary | ICD-10-CM | POA: Diagnosis present

## 2016-01-18 DIAGNOSIS — S32592D Other specified fracture of left pubis, subsequent encounter for fracture with routine healing: Secondary | ICD-10-CM

## 2016-01-18 DIAGNOSIS — S32591A Other specified fracture of right pubis, initial encounter for closed fracture: Secondary | ICD-10-CM | POA: Diagnosis present

## 2016-01-18 LAB — BASIC METABOLIC PANEL
Anion gap: 9 (ref 5–15)
BUN: 16 mg/dL (ref 6–20)
CO2: 26 mmol/L (ref 22–32)
Calcium: 8.8 mg/dL — ABNORMAL LOW (ref 8.9–10.3)
Chloride: 94 mmol/L — ABNORMAL LOW (ref 101–111)
Creatinine, Ser: 0.91 mg/dL (ref 0.44–1.00)
GFR calc Af Amer: >60 mL/min (ref >60)
GFR calc non Af Amer: 55 mL/min — ABNORMAL LOW (ref >60)
Glucose, Bld: 158 mg/dL — ABNORMAL HIGH (ref 65–99)
Potassium: 3.9 mmol/L (ref 3.5–5.1)
Sodium: 129 mmol/L — ABNORMAL LOW (ref 135–145)

## 2016-01-18 LAB — CBC WITH DIFFERENTIAL/PLATELET
Basophils Absolute: 0 10*3/uL (ref 0.0–0.1)
Basophils Relative: 0 %
Eosinophils Absolute: 0 10*3/uL (ref 0.0–0.7)
Eosinophils Relative: 1 %
HCT: 18.7 % — ABNORMAL LOW (ref 36.0–46.0)
Hemoglobin: 6.5 g/dL — CL (ref 12.0–15.0)
Lymphocytes Relative: 11 %
Lymphs Abs: 1 10*3/uL (ref 0.7–4.0)
MCH: 32.8 pg (ref 26.0–34.0)
MCHC: 34.8 g/dL (ref 30.0–36.0)
MCV: 94.4 fL (ref 78.0–100.0)
Monocytes Absolute: 1.2 10*3/uL — ABNORMAL HIGH (ref 0.1–1.0)
Monocytes Relative: 14 %
Neutro Abs: 6.1 10*3/uL (ref 1.7–7.7)
Neutrophils Relative %: 74 %
Platelets: 133 10*3/uL — ABNORMAL LOW (ref 150–400)
RBC: 1.98 MIL/uL — ABNORMAL LOW (ref 3.87–5.11)
RDW: 14.4 % (ref 11.5–15.5)
WBC: 8.3 10*3/uL (ref 4.0–10.5)

## 2016-01-18 LAB — POC OCCULT BLOOD, ED: Fecal Occult Bld: NEGATIVE

## 2016-01-18 LAB — ABO/RH: ABO/RH(D): A POS

## 2016-01-18 LAB — I-STAT CHEM 8, ED
BUN: 16 mg/dL (ref 6–20)
CHLORIDE: 90 mmol/L — AB (ref 101–111)
CREATININE: 0.9 mg/dL (ref 0.44–1.00)
Calcium, Ion: 1.11 mmol/L — ABNORMAL LOW (ref 1.13–1.30)
GLUCOSE: 158 mg/dL — AB (ref 65–99)
HCT: 20 % — ABNORMAL LOW (ref 36.0–46.0)
HEMOGLOBIN: 6.8 g/dL — AB (ref 12.0–15.0)
POTASSIUM: 3.9 mmol/L (ref 3.5–5.1)
Sodium: 127 mmol/L — ABNORMAL LOW (ref 135–145)
TCO2: 24 mmol/L (ref 0–100)

## 2016-01-18 LAB — PREPARE RBC (CROSSMATCH)

## 2016-01-18 MED ORDER — ACETAMINOPHEN 325 MG PO TABS: 650.0000 mg | ORAL_TABLET | Freq: Four times a day (QID) | ORAL | Status: DC | PRN

## 2016-01-18 MED ORDER — TRAMADOL HCL 50 MG PO TABS
50.0000 mg | ORAL_TABLET | Freq: Four times a day (QID) | ORAL | Status: DC | PRN
  Administered 2016-01-19: 50 mg via ORAL
  Filled 2016-01-18: qty 1

## 2016-01-18 MED ORDER — SODIUM CHLORIDE 0.9 % IV BOLUS (SEPSIS)
1000.0000 mL | Freq: Once | INTRAVENOUS | Status: AC
  Administered 2016-01-18: 1000 mL via INTRAVENOUS

## 2016-01-18 MED ORDER — METOPROLOL TARTRATE 25 MG PO TABS
25.0000 mg | ORAL_TABLET | Freq: Two times a day (BID) | ORAL | Status: DC
  Administered 2016-01-19 – 2016-01-21 (×5): 25 mg via ORAL
  Filled 2016-01-18 (×5): qty 1

## 2016-01-18 MED ORDER — SODIUM CHLORIDE 0.9% FLUSH
3.0000 mL | Freq: Two times a day (BID) | INTRAVENOUS | Status: DC
  Administered 2016-01-19 – 2016-01-20 (×4): 3 mL via INTRAVENOUS

## 2016-01-18 MED ORDER — SODIUM CHLORIDE 0.9 % IV SOLN
INTRAVENOUS | Status: DC
  Administered 2016-01-18: 23:00:00 via INTRAVENOUS

## 2016-01-18 MED ORDER — IOHEXOL 300 MG/ML  SOLN
100.0000 mL | Freq: Once | INTRAMUSCULAR | Status: AC | PRN
  Administered 2016-01-18: 80 mL via INTRAVENOUS

## 2016-01-18 MED ORDER — SODIUM CHLORIDE 0.9 % IV SOLN: Freq: Once | INTRAVENOUS | Status: DC

## 2016-01-18 MED ORDER — LEVOTHYROXINE SODIUM 50 MCG PO TABS
50.0000 ug | ORAL_TABLET | Freq: Every day | ORAL | Status: DC
  Administered 2016-01-19 – 2016-01-21 (×3): 50 ug via ORAL
  Filled 2016-01-18 (×3): qty 1

## 2016-01-18 NOTE — ED Notes (Signed)
She was evaluated for a fall here 01-14-16.  She was found to have hgb in the 6's today; so staff at [facility] sent her to E.D. For eval.  Pt. Is alert and confused at her baseline and in no distress.  She has various bruises which do not appear new.

## 2016-01-18 NOTE — ED Notes (Signed)
Pt. Is unable to urinate at this time.  

## 2016-01-18 NOTE — ED Notes (Signed)
Attempted report x1, RN in room with another patient.  Will call back.

## 2016-01-18 NOTE — ED Notes (Signed)
HGB of 6.5 called by lab and relayed to Dr. Tyrone Nine by me at this time.

## 2016-01-18 NOTE — H&P (Signed)
History and Physical  Patient Name: Jasmine Torres     [id number]    DOB: Sep 26, 1925    DOA: 01/18/2016 Referring physician: Deno Etienne, MD PCP: [name], MD      Chief Complaint: Low Hgb  HPI: Jasmine Torres is a 82 y.o. female with a past medical history significant for Afib on clopidogrel and aspirin, HTN, hypothyroidism and recent pubic ramus fx who presents with low Hgb.  The patient was previously living independently at home until three days ago when she fell and suffered a pubic ramus fracture.  Was at [facility] for two days and then discharged yesterday to SNF.  Routine admission lab work at [facility] returned this morning with Hgb 6, repeat confirmed, and so patient was sent to ER.  In the ED, she was in Afib with HR 110 but otherwise hemodynamically stable.  FOBT was negative and a CT of the abdomen and pelvis showed a 120cc lower pelvic hematoma and possibly retroperitoneal hemorrhage.  Repeat Hgb was stable at 6.5 g/dL and the patient was asymptomatic.  The case was discussed with Trauma surgery who recommended transfer to [facility] and medical admission, so TRH were asked to evaluate.  On my interview, she is alert and oriented, states that her pain is improving steadily since hte fall, she was able to work with PT yesterday in the hospital and felt like she was regaining function.  She had some mild intermittent low abdomen discomfort, improved with food, and was inconsistent about whether she has back pain, but denied melena, hematochezia, history of GIB, dizziness, dyspnea.     Review of Systems:  Pt complains of groin pain from fall, intermittent low abdominal pain. Pt denies any chest discomfort, confusion, dizziness, dyspnea, severe abdominal pain or back pain, melena or hematochezia.  All other systems negative except as just noted or noted in the history of present illness.  Allergies  Allergen Reactions  . Penicillins Other (See Comments)    Has patient had a PCN reaction causing  immediate rash, facial/tongue/throat swelling, SOB or lightheadedness with hypotension: unknown Has patient had a PCN reaction causing severe rash involving mucus membranes or skin necrosis: unknown Has patient had a PCN reaction that required hospitalization unknown Has patient had a PCN reaction occurring within the last 10 years: no If all of the above answers are "NO", then may proceed with Cephalosporin use.     Prior to Admission medications   Medication Sig Start Date End Date Taking? Authorizing Provider  aspirin EC 81 MG tablet Take 81 mg by mouth daily.     Yes Historical Provider, MD  Calcium-Magnesium-Vitamin D (CALCIUM MAGNESIUM PO) Take 1 tab by mouth every morning & 2 tabs at bedtime   Yes Historical Provider, MD  clopidogrel (PLAVIX) 75 MG tablet Take 75 mg by mouth daily.     Yes Historical Provider, MD  levothyroxine (SYNTHROID, LEVOTHROID) 50 MCG tablet Take 50 mcg by mouth daily before breakfast.   Yes Historical Provider, MD  Omega-3 Fatty Acids (FISH OIL) 1200 MG CAPS Take 2 capsules by mouth every morning.    Yes Historical Provider, MD  pantoprazole (PROTONIX) 20 MG tablet Take 20 mg by mouth daily as needed for heartburn.   Yes Historical Provider, MD  traMADol (ULTRAM) 50 MG tablet Take 50 mg by mouth every 6 (six) hours as needed for moderate pain or severe pain.   Yes Historical Provider, MD  acetaminophen (TYLENOL) 325 MG tablet Take 2 tablets (650 mg total) by  mouth every 6 (six) hours as needed for mild pain (or Fever >/= 101). 01/17/16   Robbie Lis, MD    Past Medical History  Diagnosis Date  . Atrial fibrillation [facility]     Patient declines anticoagulation  . Hypothyroidism   . GERD (gastroesophageal reflux disease)   . Anxiety   . Aortic valve disorders   . Essential hypertension, benign   . History of TIA (transient ischemic attack)   . 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 Pulmonary nodule     Stable 4 mm left  upper lobe, benign  . Carotid artery disease ([place])     Less than 50% bilaterally 2/13  . Skin cancer   . Rectal bleeding 05/14/2013    Past Surgical History  Procedure Laterality Date  . Abdominal hysterectomy    . Tonsillectomy    . Abdominal hysterectomy    . Skin cancer removed    . Tonsillectomy    . Cataract extraction bilateral w/ anterior vitrectomy    . Colonoscopy N/A 05/20/2013    Procedure: COLONOSCOPY;  Surgeon: Rogene Houston, MD;  Location: [facility];  Service: Endoscopy;  Laterality: N/A;  145  . Left heart catheterization with coronary angiogram N/A 03/06/2013    Procedure: LEFT HEART CATHETERIZATION WITH CORONARY ANGIOGRAM;  Surgeon: Larey Dresser, MD;  Location: [facility] CATH LAB;  Service: Cardiovascular;  Laterality: N/A;    Family history: family history includes Breast cancer in her sister; CAD in her brother; Diabetes Mellitus II in her mother.  Social History: Patient lived at home independently before her fall.  Her daughter reports she still drives, but needs help with some things like finances.  She does not use a cane or walker.  She never smoked.  She lives now in [place] and worked for a time for [employer]. Her daughterr [name] is her HCPOA.        Physical Exam: BP 127/68 mmHg  Pulse 86  Temp(Src) 99.4 F (37.4 C) (Oral)  Resp 19  SpO2 98% General appearance: Frail elderly adult female, alert and in no acute distress.   Eyes: Anicteric, conjunctiva pink, tearing, lids and lashes normal.     ENT: No nasal deformity, discharge, or epistaxis.  OP tacky but without lesions.   Lymph: No cervical or supraclavicular lymphadenopathy. Skin: Warm and dry.  No flank bruising.  Bruising diffusely, largest on left inner elbow. Cardiac: Irregularly irregular, nl S1-S2, no murmurs appreciated.  Capillary refill is brisk.  JVP normal.  No LE edema.  Radial and DP pulses 2+ and symmetric. Respiratory: Normal respiratory rate and rhythm.  CTAB without  rales or wheezes. Abdomen: Abdomen soft without rigidity.  Mild LLQ TTP no guarding. No ascites, distension.   MSK: No deformities or effusions. Neuro: Cranial nerves normal.  Oriented to time, place, and situation.  Memory appears poor.  Attention normal.  Speech is fluent.  Globally weak and slow, but moves all extremities equally.    Psych: Behavior appropriate.  Affect normal.  No evidence of aural or visual hallucinations or delusions.       Labs on Admission:  The metabolic panel shows hyponatremia, new.  Normal renal function. FOBT negative The complete blood count shows Hgb 6.5 g/dL from discharge Hgb 12.  Platelets slightly trended down, 133K/uL.  No leukocytosis.   Radiological Exams on Admission: Personally reviewed: Ct Abdomen Pelvis W Contrast 01/18/2016 Radiology IMPRESSION: Bilateral rami fractures, more  displaced on the left. Left sacral fractures. Left pelvic sidewall hematoma with estimated volume of 120 cc. More indistinct extraperitoneal bleeding in the presacral space and in the right lower retroperitoneum. No evidence of organ injury. Small low-density pleural effusion on the left with mild dependent atelectasis. Chronic benign lymph node or cyst adjacent to the aorta at the thoracic inlet. Benign appearing liver cysts and renal cysts. Atherosclerosis of the aorta and its branch vessels.   Ct Head Wo Contrast 01/18/2016 [facility]     EKG: Independently reviewed. Atrial fibrillation, rate 115, no ST changes.      Assessment/Plan 1. Pelvic hematoma, ?retroperitoneal hematoma and acute blood loss anemia:  New since discharge, when Hgb was 12 g/dL.  The imaging shows a 120cc blood collection in the left pelvis and question indistinctness, possible retroperitoneal hematoma on the right. -Transfuse 1 unit now, then recheck Hgb -Serial Hgb overnight -Hold antiplatelet agents -Fluid bolus -Consult to Trauma, appreciate cares; re-image per primary team or for worsening  status   2. Pubic ramus and sacral fx:  Stable -Consult to PT  3. Afib:  CHADs2Vasc 7 (age, gender, HTN, TIA, carotid disease).  Not on warfarin due to patient preference.  Tachycardic at admission. -Optimize fluid status -Restart home metoprolol given tachycardia, stable BP -Hold home clopidogrel and aspirin for now  4. HTN:  -Continue home metoprolol  5. Hypothyroidism:  -Check TSH -Continue home levothyroxine  6. Thrombocytopenia:  Mild, presumed due to blood loss. -Trend CBC  7. Hyponatremia:  Likewise, presumed hypovolemic. -Small fluid bolus -Check urine and serum osmolality     DVT PPx: SCDs Diet: Regular Consultants: Trauma Code Status: Full Family Communication: Daughter and friend, present at bedside.  Discussed stopping plavix, transfusion and serial Hgb.  Transfer to [facility] for Trauma eval.  CODE STATUS confirmed.  Medical decision making: What exists of the patient's previous chart was reviewed in depth and the case was discussed with Dr. Tyrone Nine. Patient seen 8:41 PM on 01/18/2016.  Disposition Plan:  I recommend admission to telemetry at [facility] given afib with somewhat elevated HR.  Clinical condition: stable but risk for deterioration and need for transfusion and close laboratory mgmt.  Anticipate several days admission.      Edwin Dada [facility] Pager [phone]

## 2016-01-18 NOTE — ED Notes (Signed)
She is resting comfortably and is in no distress.  Transfusion finished and pt. Is awaiting transfer to [facility].

## 2016-01-18 NOTE — ED Notes (Signed)
Bed: WA15 Expected date:  Expected time:  Means of arrival:  Comments: Hold for resus B 

## 2016-01-18 NOTE — ED Notes (Signed)
[facility] here for transport 

## 2016-01-18 NOTE — ED Provider Notes (Signed)
CSN: [id number]     Arrival date & time 01/18/16  1445 History   First MD Initiated Contact with Patient 01/18/16 1500     Chief Complaint  Patient presents with  . Weakness     (Consider location/radiation/quality/duration/timing/severity/associated sxs/prior Treatment) Patient is a 82 y.o. female presenting with general illness. The history is provided by the patient.  Illness Severity:  Mild Onset quality:  Sudden Duration:  4 days Timing:  Constant Progression:  Worsening Chronicity:  New Associated symptoms: no chest pain, no congestion, no fever, no headaches, no myalgias, no nausea, no rhinorrhea, no shortness of breath, no vomiting and no wheezing    82 yo F  With a chief complaint of weakness. This been going on the past couple days. Patient had a fall about 4 days ago and had bilateral pubic rami fractures.  The patient was in the hospital for a couple days and then transferred over to a rehabilitation facility. While there patient had a hemoglobin drawn today that was found to be 6. 6 g lower than it was when she was discharged. Family denies any  Bloody emesis or bloody diarrhea. Patient has been feeling sick and had some vomiting. Denies fevers or chills. Denies new bruising. Patient is on aspirin and Plavix for atrial fibrillation.  Past Medical History  Diagnosis Date  . Atrial fibrillation [facility])     Patient declines anticoagulation  . Hypothyroidism   . GERD (gastroesophageal reflux disease)   . Anxiety   . Aortic valve disorders   . Essential hypertension, benign   . History of TIA (transient ischemic attack)   . 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 Pulmonary nodule     Stable 4 mm left upper lobe, benign  . Carotid artery disease ([place])     Less than 50% bilaterally 2/13  . Skin cancer   . Rectal bleeding 05/14/2013   Past Surgical History  Procedure Laterality Date  . Abdominal hysterectomy    . Tonsillectomy    .  Abdominal hysterectomy    . Skin cancer removed    . Tonsillectomy    . Cataract extraction bilateral w/ anterior vitrectomy    . Colonoscopy N/A 05/20/2013    Procedure: COLONOSCOPY;  Surgeon: Rogene Houston, MD;  Location: [facility];  Service: Endoscopy;  Laterality: N/A;  145  . Left heart catheterization with coronary angiogram N/A 03/06/2013    Procedure: LEFT HEART CATHETERIZATION WITH CORONARY ANGIOGRAM;  Surgeon: Larey Dresser, MD;  Location: [facility] CATH LAB;  Service: Cardiovascular;  Laterality: N/A;   Family History  Problem Relation Age of Onset  . Diabetes Mellitus II Mother   . CAD Brother   . Breast cancer Sister    Social History  Substance Use Topics  . Smoking status: Never Smoker   . Smokeless tobacco: Never Used  . Alcohol Use: No   OB History    No data available     Review of Systems  Constitutional: Negative for fever and chills.  HENT: Negative for congestion and rhinorrhea.   Eyes: Negative for redness and visual disturbance.  Respiratory: Negative for shortness of breath and wheezing.   Cardiovascular: Negative for chest pain and palpitations.  Gastrointestinal: Negative for nausea and vomiting.  Genitourinary: Negative for dysuria and urgency.  Musculoskeletal: Negative for myalgias and arthralgias.  Skin: Negative for pallor and wound.  Neurological: Positive for weakness. Negative for dizziness and headaches.  Allergies  Penicillins  Home Medications   Prior to Admission medications   Medication Sig Start Date End Date Taking? Authorizing Provider  aspirin EC 81 MG tablet Take 81 mg by mouth daily.     Yes Historical Provider, MD  Calcium-Magnesium-Vitamin D (CALCIUM MAGNESIUM PO) Take 1 tab by mouth every morning & 2 tabs at bedtime   Yes Historical Provider, MD  clopidogrel (PLAVIX) 75 MG tablet Take 75 mg by mouth daily.     Yes Historical Provider, MD  levothyroxine (SYNTHROID, LEVOTHROID) 50 MCG tablet Take 50 mcg by mouth daily  before breakfast.   Yes Historical Provider, MD  Omega-3 Fatty Acids (FISH OIL) 1200 MG CAPS Take 2 capsules by mouth every morning.    Yes Historical Provider, MD  pantoprazole (PROTONIX) 20 MG tablet Take 20 mg by mouth daily as needed for heartburn.   Yes Historical Provider, MD  traMADol (ULTRAM) 50 MG tablet Take 50 mg by mouth every 6 (six) hours as needed for moderate pain or severe pain.   Yes Historical Provider, MD  acetaminophen (TYLENOL) 325 MG tablet Take 2 tablets (650 mg total) by mouth every 6 (six) hours as needed for mild pain (or Fever >/= 101). 01/17/16   Robbie Lis, MD   BP 123/61 mmHg  Pulse 103  Temp(Src) 99 F (37.2 C) (Oral)  Resp 18  SpO2 99% Physical Exam  Constitutional: She is oriented to person, place, and time. She appears well-developed and well-nourished. No distress.  pallor  HENT:  Head: Normocephalic and atraumatic.  Eyes: EOM are normal. Pupils are equal, round, and reactive to light.  Neck: Normal range of motion. Neck supple.  Cardiovascular: Normal rate and regular rhythm.  Exam reveals no gallop and no friction rub.   No murmur heard. Pulmonary/Chest: Effort normal. She has no wheezes. She has no rales.  Abdominal: Soft. She exhibits no distension. There is tenderness. There is no rebound and no guarding.  Genitourinary:  Hard stool in vault  Musculoskeletal: She exhibits no edema or tenderness.  Neurological: She is alert and oriented to person, place, and time.  Skin: Skin is warm and dry. She is not diaphoretic.  Psychiatric: She has a normal mood and affect. Her behavior is normal.  Nursing note and vitals reviewed.   ED Course  Procedures (including critical care time) Labs Review Labs Reviewed  CBC WITH DIFFERENTIAL/PLATELET - Abnormal; Notable for the following:    RBC 1.98 (*)    Hemoglobin 6.5 (*)    HCT 18.7 (*)    Platelets 133 (*)    Monocytes Absolute 1.2 (*)    All other components within normal limits  BASIC  METABOLIC PANEL - Abnormal; Notable for the following:    Sodium 129 (*)    Chloride 94 (*)    Glucose, Bld 158 (*)    Calcium 8.8 (*)    GFR calc non Af Amer 55 (*)    All other components within normal limits  I-STAT CHEM 8, ED - Abnormal; Notable for the following:    Sodium 127 (*)    Chloride 90 (*)    Glucose, Bld 158 (*)    Calcium, Ion 1.11 (*)    Hemoglobin 6.8 (*)    HCT 20.0 (*)    All other components within normal limits  URINALYSIS, ROUTINE W REFLEX MICROSCOPIC (NOT AT [facility])  POC OCCULT BLOOD, ED  TYPE AND SCREEN  PREPARE RBC (CROSSMATCH)  ABO/RH    Imaging Review Mr  Brain Wo Contrast  01/16/2016  CLINICAL DATA:  82 year old female with confusion today after fall. Initial encounter. EXAM: MRI HEAD WITHOUT CONTRAST TECHNIQUE: Multiplanar, multiecho pulse sequences of the brain and surrounding structures were obtained without intravenous contrast. COMPARISON:  Head and cervical spine CT 01/14/2016. [facility] brain MRI 02/28/2012 Report (no images available). FINDINGS: No restricted diffusion to suggest acute infarction. No midline shift, mass effect, evidence of mass lesion, ventriculomegaly, extra-axial collection or acute intracranial hemorrhage. Cervicomedullary junction and pituitary are within normal limits. Major intracranial vascular flow voids are preserved with intracranial artery dolichoectasia. Incidental inferior left basal ganglia dilated perivascular space (normal variant). Pearline Cables and white matter signal is within normal limits for age throughout the brain. No cortical encephalomalacia or chronic cerebral blood products. Visible internal auditory structures appear normal. Trace left mastoid fluid, appears inconsequential. Negative nasopharynx. Negative paranasal sinuses. Postoperative changes to both globes. Negative scalp soft tissues. Normal bone marrow signal. Negative visualized cervical spine. IMPRESSION: No acute intracranial abnormality.  Negative for age noncontrast MRI appearance of the brain. Electronically Signed   By: Genevie Ann M.D.   On: 01/16/2016 20:21   Ct Abdomen Pelvis W Contrast  01/18/2016  CLINICAL DATA:  Recent fall.  Presentation with anemia. EXAM: CT ABDOMEN AND PELVIS WITH CONTRAST TECHNIQUE: Multidetector CT imaging of the abdomen and pelvis was performed using the standard protocol following bolus administration of intravenous contrast. CONTRAST:  62mL OMNIPAQUE IOHEXOL 300 MG/ML  SOLN COMPARISON:  CT 11/20/2005 FINDINGS: There is a small left effusion layering dependently with mild atelectasis at the lung bases. No evidence of consolidation. Tiny amount of pericardial fluid. The pleural fluid is not hyper dense. There are scattered hypodensities within the liver, the largest measuring 16 mm in the right lobe. Those visible on the delayed imaging do not enhance in therefore there likely to represent benign cysts. There are no worrisome foci. No evidence of liver injury. No calcified gallstones. The gallbladder is collapsed. The spleen is normal without evidence of injury. Small accessory spleen. The adrenal glands are normal. The kidneys contain several benign appearing cysts. The aorta and its branch vessels show atherosclerosis but there is no aneurysm. The IVC is normal. There is a peripherally calcified low-density lymph node adjacent to the aorta at the thoracic inlet measuring 14 x 20 mm. This was present in 2006 and is benign. There is mild retroperitoneal stranding on the right inferior to the kidney. This is nonspecific but consistent with a small amount of retroperitoneal bleeding. There are rami are fractures bilaterally more displaced on the left. There are left sacral fractures. No femur fracture. Hematoma along the left pelvic sidewall measures approximately 10 x 5 x 5 cm, volume estimated at 120 cc. Small amount of blood also probably present in the presacral space. No spinal fracture. Mass effect upon the bladder.  No evidence of primary bladder injury. No acute bowel finding. IMPRESSION: Bilateral rami fractures, more displaced on the left. Left sacral fractures. Left pelvic sidewall hematoma with estimated volume of 120 cc. More indistinct extraperitoneal bleeding in the presacral space and in the right lower retroperitoneum. No evidence of organ injury. Small low-density pleural effusion on the left with mild dependent atelectasis. Chronic benign lymph node or cyst adjacent to the aorta at the thoracic inlet. Benign appearing liver cysts and renal cysts. Atherosclerosis of the aorta and its branch vessels. Electronically Signed   By: Nelson Chimes M.D.   On: 01/18/2016 16:31   I have personally reviewed and  evaluated these images and lab results as part of my medical decision-making.   EKG Interpretation   Date/Time:  Wednesday January 18 2016 15:18:43 EST Ventricular Rate:  115 PR Interval:    QRS Duration: 89 QT Interval:  329 QTC Calculation: 455 R Axis:   64 Text Interpretation:  Atrial fibrillation Anteroseptal infarct, age  indeterminate No significant change since last tracing Confirmed by Tramon Crescenzo  MD, DANIEL [phone]) on 01/18/2016 4:12:38 PM      Emergency Focused Ultrasound Exam Limited Ultrasound of the Abdomen and Pericardium (FAST Exam)  Performed and interpreted by Dr. Tyrone Nine Indication: Trauma Multiple views of the abdomen and pericardium are obtained with a multi-frequency probe. Findings:  anechoic fluid in abdomen, no anechoic fluid surrounding heart Interpretation: ? Hemoperitoneum Appears to be enclosed fluid structure separate from bladder.  Doesn't appear to be in typical location for free fluid, no pericardial effusion, without tamponade Images archived electronically.  CPT Codes: cardiac [id number], abdomen [phone] (study includes both codes)  MDM   Final diagnoses:  Bilateral pubic rami fractures, with routine healing, subsequent encounter  Pelvic hematoma, female    82 yo F   With chief complaint  Of weakness. Patient was found to have a hemoglobin of 6.9 at her PCPs office. Repeat his same here. Patient is status post 6 g hemoglobin drop. Discussed case with CT scan will have her bumped up in priority. Rectal exam without dark or blood.    CT scan with hematoma in the pelvis. No signs of active bleeding.  Discussed case with Dr. Johney Maine. He recommends transfer to [facility] and hospitalist admission. Trauma will then see the patient in the morning. Serial CBCs. Recommended holding plavix.  CRITICAL CARE Performed by: Cecilio Asper   Total critical care time: 78 minutes  Critical care time was exclusive of separately billable procedures and treating other patients.  Critical care was necessary to treat or prevent imminent or life-threatening deterioration.  Critical care was time spent personally by me on the following activities: development of treatment plan with patient and/or surrogate as well as nursing, discussions with consultants, evaluation of patient's response to treatment, examination of patient, obtaining history from patient or surrogate, ordering and performing treatments and interventions, ordering and review of laboratory studies, ordering and review of radiographic studies, pulse oximetry and re-evaluation of patient's condition.   The patients results and plan were reviewed and discussed.   Any x-rays performed were independently reviewed by myself.   Differential diagnosis were considered with the presenting HPI.  Medications  0.9 %  sodium chloride infusion (not administered)  sodium chloride 0.9 % bolus 1,000 mL (1,000 mLs Intravenous New Bag/Given 01/18/16 1630)  iohexol (OMNIPAQUE) 300 MG/ML solution 100 mL (80 mLs Intravenous Contrast Given 01/18/16 1607)    Filed Vitals:   01/18/16 1453 01/18/16 1632 01/18/16 1707 01/18/16 1745  BP: 116/55 122/67 119/55 123/61  Pulse:  108 103   Temp: 99 F (37.2 C) 99 F (37.2 C) 99 F (37.2 C)    TempSrc: Oral Oral Oral   Resp: 18 18    SpO2: 94% 91% 99%     Final diagnoses:  Bilateral pubic rami fractures, with routine healing, subsequent encounter  Pelvic hematoma, female    Admission/ observation were discussed with the admitting physician, patient and/or family and they are comfortable with the plan.    Deno Etienne, DO 01/18/16 2021

## 2016-01-18 NOTE — Progress Notes (Signed)
CSW met with patient at bedside. Patient appeared drowsy. Daughter was present. She confirms that patient is from [facility] facility, and states she ha been staying at the facility since yesterday. Per note, patient presents to [facility] due to finding hgb in high 6's today. Therefore, facility staff sent her to [facility].  Daughter states patient receives assistance with ADL's. Also, she denies that the patient falls often. She states patient has fallen x1 within 6 months. Daughter states patient walks with no equipment.   Daughter states upon discharge she would feel safe with patient returning to facility. Daughter states she is POA for patient.  Daughter/[name] Holliday [phone]  Willette Brace [phone] ED CSW 01/18/2016 7:40 PM

## 2016-01-19 DIAGNOSIS — I10 Essential (primary) hypertension: Secondary | ICD-10-CM

## 2016-01-19 DIAGNOSIS — I482 Chronic atrial fibrillation: Secondary | ICD-10-CM

## 2016-01-19 DIAGNOSIS — D696 Thrombocytopenia, unspecified: Secondary | ICD-10-CM

## 2016-01-19 DIAGNOSIS — E871 Hypo-osmolality and hyponatremia: Secondary | ICD-10-CM

## 2016-01-19 DIAGNOSIS — S32501D Unspecified fracture of right pubis, subsequent encounter for fracture with routine healing: Secondary | ICD-10-CM

## 2016-01-19 DIAGNOSIS — D62 Acute posthemorrhagic anemia: Secondary | ICD-10-CM

## 2016-01-19 DIAGNOSIS — N9489 Other specified conditions associated with female genital organs and menstrual cycle: Secondary | ICD-10-CM

## 2016-01-19 DIAGNOSIS — S32502D Unspecified fracture of left pubis, subsequent encounter for fracture with routine healing: Secondary | ICD-10-CM

## 2016-01-19 LAB — CBC
HCT: 23.2 % — ABNORMAL LOW (ref 36.0–46.0)
HCT: 23.5 % — ABNORMAL LOW (ref 36.0–46.0)
HEMOGLOBIN: 8 g/dL — AB (ref 12.0–15.0)
Hemoglobin: 8.1 g/dL — ABNORMAL LOW (ref 12.0–15.0)
MCH: 31.4 pg (ref 26.0–34.0)
MCH: 31 pg (ref 26.0–34.0)
MCHC: 34.9 g/dL (ref 30.0–36.0)
MCHC: 34 g/dL (ref 30.0–36.0)
MCV: 89.9 fL (ref 78.0–100.0)
MCV: 91.1 fL (ref 78.0–100.0)
PLATELETS: 133 10*3/uL — AB (ref 150–400)
PLATELETS: 145 10*3/uL — AB (ref 150–400)
RBC: 2.58 MIL/uL — ABNORMAL LOW (ref 3.87–5.11)
RBC: 2.58 MIL/uL — ABNORMAL LOW (ref 3.87–5.11)
RDW: 15.8 % — AB (ref 11.5–15.5)
RDW: 15.6 % — AB (ref 11.5–15.5)
WBC: 8 10*3/uL (ref 4.0–10.5)
WBC: 6.5 10*3/uL (ref 4.0–10.5)

## 2016-01-19 LAB — TYPE AND SCREEN
ABO/RH(D): A POS
Antibody Screen: NEGATIVE
Unit division: 0

## 2016-01-19 LAB — HEMOGLOBIN AND HEMATOCRIT, BLOOD
HCT: 21.9 % — ABNORMAL LOW (ref 36.0–46.0)
Hemoglobin: 7.7 g/dL — ABNORMAL LOW (ref 12.0–15.0)

## 2016-01-19 LAB — URINALYSIS, ROUTINE W REFLEX MICROSCOPIC
Bilirubin Urine: NEGATIVE
Glucose, UA: 250 mg/dL — AB
Hgb urine dipstick: NEGATIVE
KETONES UR: NEGATIVE mg/dL
LEUKOCYTES UA: NEGATIVE
NITRITE: NEGATIVE
PROTEIN: NEGATIVE mg/dL
Specific Gravity, Urine: 1.037 — ABNORMAL HIGH (ref 1.005–1.030)
pH: 7.5 (ref 5.0–8.0)

## 2016-01-19 LAB — MRSA PCR SCREENING: MRSA by PCR: NEGATIVE

## 2016-01-19 LAB — SODIUM, URINE, RANDOM: SODIUM UR: 55 mmol/L

## 2016-01-19 LAB — OSMOLALITY, URINE: Osmolality, Ur: 437 mOsm/kg (ref 300–900)

## 2016-01-19 LAB — CREATININE, URINE, RANDOM: CREATININE, URINE: 52.05 mg/dL

## 2016-01-19 MED ORDER — ACETAMINOPHEN 325 MG PO TABS
650.0000 mg | ORAL_TABLET | ORAL | Status: DC | PRN
  Administered 2016-01-20: 650 mg via ORAL
  Filled 2016-01-19: qty 2

## 2016-01-19 MED ORDER — HALOPERIDOL LACTATE 5 MG/ML IJ SOLN
1.0000 mg | Freq: Four times a day (QID) | INTRAMUSCULAR | Status: DC | PRN
  Administered 2016-01-21: 1 mg via INTRAVENOUS
  Filled 2016-01-19: qty 1

## 2016-01-19 MED ORDER — SODIUM CHLORIDE 0.9 % IV SOLN: INTRAVENOUS | Status: AC

## 2016-01-19 NOTE — Progress Notes (Signed)
[facility] PROGRESS NOTE    Progress Note   Jasmine Torres [id number] DOB: 05-05-1925 DOA: 01/18/2016 PCP: [name], MD   Brief Narrative:   Jasmine Torres is an 82 y.o. female past medical history of A. fib on aspirin and Plavix and a recent P Graham's fracture who presents with a low hemoglobin  Assessment/Plan:   Pelvic hematoma, female/Acute blood loss anemia: CT scan of the abdomen and pelvis show possible bilateral pubic rami fracture displaced on the right with a left-sided sidewall bleeding and possibly some retroperitoneal bleeding on the right. On discharge on 01/17/2016 her hemoglobin was 12, on admission on 01/18/2016, 6.8. She received 1 unit of packed red blood cells on 01/19/2016 her hemoglobin was 7.7. Hold antiplatelet agents, recheck a CBC now and in the morning. Consult orthopedics, trauma consulted.  Essential hypertension, benign: Continue metoprolol.  Chronic atrial fibrillation ([place]): CHADs2Vasc 7, Not on warfarin due to patient preference.  Continue to hold antiplatelet therapy due to bleeding. Continue metoprolol. Change CBCs to every 12.  Hypothyroidism: TSH pending.  Mild Thrombocytopenia ([place]): Mild drop in platelet count we'll continue to trend.   Hyponatremia: Likely hypovolemic in nature start aggressive IV fluid hydration. She had a 2-D echo in 2012 that showed a normal ejection fraction.  Acute confusional state: Narcotics use Haldol when necessary, for agitation.    DVT Prophylaxis - SCD's.  Family Communication: none Disposition Plan: unable to determine. Code Status:     Code Status Orders        Start     Ordered   01/18/16 2234  Full code   Continuous     01/18/16 2233    Code Status History    Date Active Date Inactive Code Status Order ID Comments User Context   01/14/2016  4:58 PM 01/17/2016  5:28 PM Full Code [id number]  Venetia Maxon Rama, MD Inpatient        IV Access:    Peripheral  IV   Procedures and diagnostic studies:   Ct Head Wo Contrast  01/18/2016  CLINICAL DATA:  Right-sided headache.  Recent fall.  Anemia. EXAM: CT HEAD WITHOUT CONTRAST TECHNIQUE: Contiguous axial images were obtained from the base of the skull through the vertex without intravenous contrast. COMPARISON:  01/14/2016 head CT. FINDINGS: No evidence of parenchymal hemorrhage or extra-axial fluid collection. No mass lesion, mass effect, or midline shift. No CT evidence of acute infarction. Intracranial atherosclerosis. Nonspecific stable mild subcortical and periventricular white matter hypodensity, most in keeping with chronic small vessel ischemic change. Stable normal variant dilated perivascular space in the inferior left basal ganglia. Stable diffuse cerebral volume loss. No ventriculomegaly. The visualized paranasal sinuses are essentially clear. The mastoid air cells are unopacified. No evidence of calvarial fracture. IMPRESSION: 1.  No evidence of acute intracranial abnormality. 2. Stable cerebral atrophy and mild chronic small vessel ischemic white matter change. Electronically Signed   By: Ilona Sorrel M.D.   On: 01/18/2016 18:36   Ct Abdomen Pelvis W Contrast  01/18/2016  CLINICAL DATA:  Recent fall.  Presentation with anemia. EXAM: CT ABDOMEN AND PELVIS WITH CONTRAST TECHNIQUE: Multidetector CT imaging of the abdomen and pelvis was performed using the standard protocol following bolus administration of intravenous contrast. CONTRAST:  75mL OMNIPAQUE IOHEXOL 300 MG/ML  SOLN COMPARISON:  CT 11/20/2005 FINDINGS: There is a small left effusion layering dependently with mild atelectasis at the lung bases. No evidence of consolidation. Tiny amount of pericardial fluid. The pleural fluid is not hyper dense.  There are scattered hypodensities within the liver, the largest measuring 16 mm in the right lobe. Those visible on the delayed imaging do not enhance in therefore there likely to represent benign cysts.  There are no worrisome foci. No evidence of liver injury. No calcified gallstones. The gallbladder is collapsed. The spleen is normal without evidence of injury. Small accessory spleen. The adrenal glands are normal. The kidneys contain several benign appearing cysts. The aorta and its branch vessels show atherosclerosis but there is no aneurysm. The IVC is normal. There is a peripherally calcified low-density lymph node adjacent to the aorta at the thoracic inlet measuring 14 x 20 mm. This was present in 2006 and is benign. There is mild retroperitoneal stranding on the right inferior to the kidney. This is nonspecific but consistent with a small amount of retroperitoneal bleeding. There are rami are fractures bilaterally more displaced on the left. There are left sacral fractures. No femur fracture. Hematoma along the left pelvic sidewall measures approximately 10 x 5 x 5 cm, volume estimated at 120 cc. Small amount of blood also probably present in the presacral space. No spinal fracture. Mass effect upon the bladder. No evidence of primary bladder injury. No acute bowel finding. IMPRESSION: Bilateral rami fractures, more displaced on the left. Left sacral fractures. Left pelvic sidewall hematoma with estimated volume of 120 cc. More indistinct extraperitoneal bleeding in the presacral space and in the right lower retroperitoneum. No evidence of organ injury. Small low-density pleural effusion on the left with mild dependent atelectasis. Chronic benign lymph node or cyst adjacent to the aorta at the thoracic inlet. Benign appearing liver cysts and renal cysts. Atherosclerosis of the aorta and its branch vessels. Electronically Signed   By: Nelson Chimes M.D.   On: 01/18/2016 16:31     Medical Consultants:    None.  Anti-Infectives:   Anti-infectives    None      Subjective:    Jasmine Torres she is hallucinating.  Objective:    Filed Vitals:   01/18/16 2312 01/19/16 0447 01/19/16 0636  01/19/16 0835  BP: 127/54 138/85  118/51  Pulse: 106 33  77  Temp: 98.6 F (37 C) 98.1 F (36.7 C)    TempSrc: Oral Oral    Resp: 20 20    Height:   5\' 6"  (1.676 m)   SpO2: 97% 98%     No intake or output data in the 24 hours ending 01/19/16 0841 There were no vitals filed for this visit.  Exam: Gen:  NAD Cardiovascular:  RRR. Chest and lungs:   CTAB Abdomen:  Abdomen soft, NT/ND, + BS Extremities:  No edema   Data Reviewed:    Labs: Basic Metabolic Panel:  Recent Labs Lab 01/14/16 1240 01/18/16 1546 01/18/16 1554  NA 137 129* 127*  K 4.0 3.9 3.9  CL 102 94* 90*  CO2 26 26  --   GLUCOSE 115* 158* 158*  BUN 16 16 16   CREATININE 0.98 0.91 0.90  CALCIUM 8.9 8.8*  --    GFR Estimated Creatinine Clearance: 39.3 mL/min (by C-G formula based on Cr of 0.9). Liver Function Tests:  Recent Labs Lab 01/14/16 1240  AST 26  ALT 19  ALKPHOS 33*  BILITOT 1.3*  PROT 5.8*  ALBUMIN 3.7   No results for input(s): LIPASE, AMYLASE in the last 168 hours. No results for input(s): AMMONIA in the last 168 hours. Coagulation profile No results for input(s): INR, PROTIME in the last 168 hours.  CBC:  Recent Labs Lab 01/14/16 1240 01/18/16 1546 01/18/16 1554 01/19/16 0039  WBC 9.4 8.3  --   --   NEUTROABS 7.7 6.1  --   --   HGB 12.2 6.5* 6.8* 7.7*  HCT 37.4 18.7* 20.0* 21.9*  MCV 94.4 94.4  --   --   PLT 169 133*  --   --    Cardiac Enzymes: No results for input(s): CKTOTAL, CKMB, CKMBINDEX, TROPONINI in the last 168 hours. BNP (last 3 results) No results for input(s): PROBNP in the last 8760 hours. CBG:  Recent Labs Lab 01/16/16 0939  GLUCAP 141*   D-Dimer: No results for input(s): DDIMER in the last 72 hours. Hgb A1c: No results for input(s): HGBA1C in the last 72 hours. Lipid Profile: No results for input(s): CHOL, HDL, LDLCALC, TRIG, CHOLHDL, LDLDIRECT in the last 72 hours. Thyroid function studies: No results for input(s): TSH, T4TOTAL, T3FREE,  THYROIDAB in the last 72 hours.  Invalid input(s): FREET3 Anemia work up: No results for input(s): VITAMINB12, FOLATE, FERRITIN, TIBC, IRON, RETICCTPCT in the last 72 hours. Sepsis Labs:  Recent Labs Lab 01/14/16 1240 01/18/16 1546  WBC 9.4 8.3   Microbiology Recent Results (from the past 240 hour(s))  MRSA PCR Screening     Status: None   Collection Time: 01/18/16 11:37 PM  Result Value Ref Range Status   MRSA by PCR NEGATIVE NEGATIVE Final    Comment:        The GeneXpert MRSA Assay (FDA approved for NASAL specimens only), is one component of a comprehensive MRSA colonization surveillance program. It is not intended to diagnose MRSA infection nor to guide or monitor treatment for MRSA infections.      Medications:   . sodium chloride   Intravenous Once  . levothyroxine  50 mcg Oral QAC breakfast  . metoprolol tartrate  25 mg Oral BID  . sodium chloride flush  3 mL Intravenous Q12H   Continuous Infusions: . sodium chloride 75 mL/hr at 01/18/16 2245    Time spent: 25 min   LOS: 1 day   Charlynne Cousins  [facility] Pager [phone]  *Please refer to [email or web address], password [email or web address] to get updated schedule on who will round on this patient, as hospitalists switch teams weekly. If 7PM-7AM, please contact night-coverage at [email or web address], password [email or web address] for any overnight needs.  01/19/2016, 8:41 AM

## 2016-01-19 NOTE — Progress Notes (Signed)
Patient ID: Jasmine Torres, female   DOB: 06-14-1925, 82 y.o.   MRN: [id number] Pelvic FXs D/W Dr. Carlean Jews team - no need for sacral screw. WBAT. I let her family know and ordered therapies. Further F/U from ortho standpoint will be with Dr. [name]. Georganna Skeans, MD, MPH, FACS Trauma: [phone] General Surgery: [phone]

## 2016-01-19 NOTE — Consult Note (Signed)
Reason for Consult:Pelvic fxs  Jasmine Torres is an 82 y.o. female.  HPI: Jasmine Torres fell down some steps on 1/28 and was admitted to [facility] after she was diagnosed with pubic rami fractures by x-ray. Her hgb was normal on admission but was not rechecked. She was continued on her Plavix and ASA for her afib and TIA. She was mobilized by PT who recommended SNF placement and she was discharged there on 1/31. The SNF checked a hemoglobin on admission and found it to be ~6.5. A repeat the next morning was similar and she was transferred back to [facility] for evaluation. A CT scan showed a large pelvic hematoma, more extensive rami fractures, and a left sacral ala fracture. She was admitted by the medicine service and trauma was asked to consult. The history was gleaned by reviewing the chart and from her dtr as the patient is delirious this am.  Past Medical History  Diagnosis Date  . Atrial fibrillation [facility])     Patient declines anticoagulation  . Hypothyroidism   . GERD (gastroesophageal reflux disease)   . Anxiety   . Aortic valve disorders   . Essential hypertension, benign   . History of TIA (transient ischemic attack)   . 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 Pulmonary nodule     Stable 4 mm left upper lobe, benign  . Carotid artery disease ([place])     Less than 50% bilaterally 2/13  . Skin cancer   . Rectal bleeding 05/14/2013    Past Surgical History  Procedure Laterality Date  . Abdominal hysterectomy    . Tonsillectomy    . Abdominal hysterectomy    . Skin cancer removed    . Tonsillectomy    . Cataract extraction bilateral w/ anterior vitrectomy    . Colonoscopy N/A 05/20/2013    Procedure: COLONOSCOPY;  Surgeon: Rogene [place], MD;  Location: [facility];  Service: Endoscopy;  Laterality: N/A;  145  . Left heart catheterization with coronary angiogram N/A 03/06/2013    Procedure: LEFT HEART CATHETERIZATION WITH CORONARY ANGIOGRAM;  Surgeon: Larey Dresser, MD;  Location: [facility] CATH LAB;  Service: Cardiovascular;  Laterality: N/A;    Family History  Problem Relation Age of Onset  . Diabetes Mellitus II Mother   . CAD Brother   . Breast cancer Sister     Social History:  reports that she has never smoked. She has never used smokeless tobacco. She reports that she does not drink alcohol or use illicit drugs.  Allergies:  Allergies  Allergen Reactions  . Penicillins Other (See Comments)    Has patient had a PCN reaction causing immediate rash, facial/tongue/throat swelling, SOB or lightheadedness with hypotension: unknown Has patient had a PCN reaction causing severe rash involving mucus membranes or skin necrosis: unknown Has patient had a PCN reaction that required hospitalization unknown Has patient had a PCN reaction occurring within the last 10 years: no If all of the above answers are "NO", then may proceed with Cephalosporin use.     Medications: I have reviewed the patient's current medications.  Results for orders placed or performed during the hospital encounter of 01/18/16 (from the past 48 hour(s))  Type and screen     Status: None   Collection Time: 01/18/16  3:35 PM  Result Value Ref Range   ABO/RH(D) A POS    Antibody Screen NEG    Sample Expiration 01/21/2016  Unit Number [id number]    Blood Component Type RED CELLS,LR    Unit division 00    Status of Unit ISSUED,FINAL    Transfusion Status OK TO TRANSFUSE    Crossmatch Result Compatible   ABO/Rh     Status: None   Collection Time: 01/18/16  3:35 PM  Result Value Ref Range   ABO/RH(D) A POS   CBC with Differential     Status: Abnormal   Collection Time: 01/18/16  3:46 PM  Result Value Ref Range   WBC 8.3 4.0 - 10.5 K/uL   RBC 1.98 (L) 3.87 - 5.11 MIL/uL   Hemoglobin 6.5 (LL) 12.0 - 15.0 g/dL    Comment: REPEATED TO VERIFY CRITICAL RESULT CALLED TO, READ BACK BY AND VERIFIED WITH: TIM SMITH,RN [id number] @ 1617 BY J SCOTTON    HCT 18.7 (L) 36.0 -  46.0 %   MCV 94.4 78.0 - 100.0 fL   MCH 32.8 26.0 - 34.0 pg   MCHC 34.8 30.0 - 36.0 g/dL   RDW 35.0 75.7 - 32.2 %   Platelets 133 (L) 150 - 400 K/uL   Neutrophils Relative % 74 %   Neutro Abs 6.1 1.7 - 7.7 K/uL   Lymphocytes Relative 11 %   Lymphs Abs 1.0 0.7 - 4.0 K/uL   Monocytes Relative 14 %   Monocytes Absolute 1.2 (H) 0.1 - 1.0 K/uL   Eosinophils Relative 1 %   Eosinophils Absolute 0.0 0.0 - 0.7 K/uL   Basophils Relative 0 %   Basophils Absolute 0.0 0.0 - 0.1 K/uL  Basic metabolic panel     Status: Abnormal   Collection Time: 01/18/16  3:46 PM  Result Value Ref Range   Sodium 129 (L) 135 - 145 mmol/L   Potassium 3.9 3.5 - 5.1 mmol/L   Chloride 94 (L) 101 - 111 mmol/L   CO2 26 22 - 32 mmol/L   Glucose, Bld 158 (H) 65 - 99 mg/dL   BUN 16 6 - 20 mg/dL   Creatinine, Ser 5.67 0.44 - 1.00 mg/dL   Calcium 8.8 (L) 8.9 - 10.3 mg/dL   GFR calc non Af Amer 55 (L) >60 mL/min   GFR calc Af Amer >60 >60 mL/min    Comment: (NOTE) The eGFR has been calculated using the CKD EPI equation. This calculation has not been validated in all clinical situations. eGFR's persistently <60 mL/min signify possible Chronic Kidney Disease.    Anion gap 9 5 - 15  I-Stat Chem 8, ED     Status: Abnormal   Collection Time: 01/18/16  3:54 PM  Result Value Ref Range   Sodium 127 (L) 135 - 145 mmol/L   Potassium 3.9 3.5 - 5.1 mmol/L   Chloride 90 (L) 101 - 111 mmol/L   BUN 16 6 - 20 mg/dL   Creatinine, Ser 2.09 0.44 - 1.00 mg/dL   Glucose, Bld 198 (H) 65 - 99 mg/dL   Calcium, Ion 0.22 (L) 1.13 - 1.30 mmol/L   TCO2 24 0 - 100 mmol/L   Hemoglobin 6.8 (LL) 12.0 - 15.0 g/dL   HCT 17.9 (L) 81.0 - 25.4 %   Comment NOTIFIED PHYSICIAN   POC occult blood, ED Provider will collect     Status: None   Collection Time: 01/18/16  4:27 PM  Result Value Ref Range   Fecal Occult Bld NEGATIVE NEGATIVE  Prepare RBC     Status: None   Collection Time: 01/18/16  4:30 PM  Result Value Ref Range  Order  Confirmation ORDER PROCESSED BY BLOOD BANK   MRSA PCR Screening     Status: None   Collection Time: 01/18/16 11:37 PM  Result Value Ref Range   MRSA by PCR NEGATIVE NEGATIVE    Comment:        The GeneXpert MRSA Assay (FDA approved for NASAL specimens only), is one component of a comprehensive MRSA colonization surveillance program. It is not intended to diagnose MRSA infection nor to guide or monitor treatment for MRSA infections.   Hemoglobin and hematocrit, blood     Status: Abnormal   Collection Time: 01/19/16 12:39 AM  Result Value Ref Range   Hemoglobin 7.7 (L) 12.0 - 15.0 g/dL   HCT 21.9 (L) 36.0 - 46.0 %  Urinalysis, Routine w reflex microscopic (not at [facility])     Status: Abnormal   Collection Time: 01/19/16 12:41 AM  Result Value Ref Range   Color, Urine YELLOW YELLOW   APPearance CLEAR CLEAR   Specific Gravity, Urine 1.037 (H) 1.005 - 1.030   pH 7.5 5.0 - 8.0   Glucose, UA 250 (A) NEGATIVE mg/dL   Hgb urine dipstick NEGATIVE NEGATIVE   Bilirubin Urine NEGATIVE NEGATIVE   Ketones, ur NEGATIVE NEGATIVE mg/dL   Protein, ur NEGATIVE NEGATIVE mg/dL   Nitrite NEGATIVE NEGATIVE   Leukocytes, UA NEGATIVE NEGATIVE    Comment: MICROSCOPIC NOT DONE ON URINES WITH NEGATIVE PROTEIN, BLOOD, LEUKOCYTES, NITRITE, OR GLUCOSE <1000 mg/dL.  Osmolality, urine     Status: None   Collection Time: 01/19/16 12:42 AM  Result Value Ref Range   Osmolality, Ur 437 300 - 900 mOsm/kg  CBC     Status: Abnormal   Collection Time: 01/19/16  9:13 AM  Result Value Ref Range   WBC 8.0 4.0 - 10.5 K/uL   RBC 2.58 (L) 3.87 - 5.11 MIL/uL   Hemoglobin 8.1 (L) 12.0 - 15.0 g/dL   HCT 23.2 (L) 36.0 - 46.0 %   MCV 89.9 78.0 - 100.0 fL   MCH 31.4 26.0 - 34.0 pg   MCHC 34.9 30.0 - 36.0 g/dL   RDW 15.8 (H) 11.5 - 15.5 %   Platelets 133 (L) 150 - 400 K/uL    Ct Head Wo Contrast  01/18/2016  CLINICAL DATA:  Right-sided headache.  Recent fall.  Anemia. EXAM: CT HEAD WITHOUT CONTRAST TECHNIQUE: Contiguous  axial images were obtained from the base of the skull through the vertex without intravenous contrast. COMPARISON:  01/14/2016 head CT. FINDINGS: No evidence of parenchymal hemorrhage or extra-axial fluid collection. No mass lesion, mass effect, or midline shift. No CT evidence of acute infarction. Intracranial atherosclerosis. Nonspecific stable mild subcortical and periventricular white matter hypodensity, most in keeping with chronic small vessel ischemic change. Stable normal variant dilated perivascular space in the inferior left basal ganglia. Stable diffuse cerebral volume loss. No ventriculomegaly. The visualized paranasal sinuses are essentially clear. The mastoid air cells are unopacified. No evidence of calvarial fracture. IMPRESSION: 1.  No evidence of acute intracranial abnormality. 2. Stable cerebral atrophy and mild chronic small vessel ischemic white matter change. Electronically Signed   By: Ilona Sorrel M.D.   On: 01/18/2016 18:36   Ct Abdomen Pelvis W Contrast  01/18/2016  CLINICAL DATA:  Recent fall.  Presentation with anemia. EXAM: CT ABDOMEN AND PELVIS WITH CONTRAST TECHNIQUE: Multidetector CT imaging of the abdomen and pelvis was performed using the standard protocol following bolus administration of intravenous contrast. CONTRAST:  72m OMNIPAQUE IOHEXOL 300 MG/ML  SOLN COMPARISON:  CT 11/20/2005 FINDINGS: There is a small left effusion layering dependently with mild atelectasis at the lung bases. No evidence of consolidation. Tiny amount of pericardial fluid. The pleural fluid is not hyper dense. There are scattered hypodensities within the liver, the largest measuring 16 mm in the right lobe. Those visible on the delayed imaging do not enhance in therefore there likely to represent benign cysts. There are no worrisome foci. No evidence of liver injury. No calcified gallstones. The gallbladder is collapsed. The spleen is normal without evidence of injury. Small accessory spleen. The  adrenal glands are normal. The kidneys contain several benign appearing cysts. The aorta and its branch vessels show atherosclerosis but there is no aneurysm. The IVC is normal. There is a peripherally calcified low-density lymph node adjacent to the aorta at the thoracic inlet measuring 14 x 20 mm. This was present in 2006 and is benign. There is mild retroperitoneal stranding on the right inferior to the kidney. This is nonspecific but consistent with a small amount of retroperitoneal bleeding. There are rami are fractures bilaterally more displaced on the left. There are left sacral fractures. No femur fracture. Hematoma along the left pelvic sidewall measures approximately 10 x 5 x 5 cm, volume estimated at 120 cc. Small amount of blood also probably present in the presacral space. No spinal fracture. Mass effect upon the bladder. No evidence of primary bladder injury. No acute bowel finding. IMPRESSION: Bilateral rami fractures, more displaced on the left. Left sacral fractures. Left pelvic sidewall hematoma with estimated volume of 120 cc. More indistinct extraperitoneal bleeding in the presacral space and in the right lower retroperitoneum. No evidence of organ injury. Small low-density pleural effusion on the left with mild dependent atelectasis. Chronic benign lymph node or cyst adjacent to the aorta at the thoracic inlet. Benign appearing liver cysts and renal cysts. Atherosclerosis of the aorta and its branch vessels. Electronically Signed   By: Nelson Chimes M.D.   On: 01/18/2016 16:31    Review of Systems  Unable to perform ROS: mental status change   Blood pressure 118/51, pulse 77, temperature 98.1 F (36.7 C), temperature source Oral, resp. rate 20, height '5\' 6"'$  (1.676 m), SpO2 98 %. Physical Exam  Constitutional: She appears well-developed and well-nourished. No distress.  HENT:  Head: Normocephalic and atraumatic.  Eyes: EOM are normal. Right eye exhibits no discharge. Left eye exhibits  no discharge. No scleral icterus.  Neck: Normal range of motion. Neck supple.  Cardiovascular: Normal heart sounds and normal pulses.  An irregularly irregular rhythm present.  Respiratory: Effort normal and breath sounds normal. No respiratory distress. She has no wheezes. She has no rales. She exhibits no tenderness.  GI: Soft. Bowel sounds are normal. She exhibits no distension. There is tenderness (Mild). There is no rebound and no guarding.  Musculoskeletal: Normal range of motion.  Lymphadenopathy:    She has no cervical adenopathy.  Neurological: She is alert.  Skin: Skin is warm and dry. She is not diaphoretic.  Psychiatric: Her mood appears anxious. Thought content is paranoid and delusional.    Assessment/Plan: Multiple pelvic fxs -- I have asked Dr. Marcelino Scot to consult. Given the extent of the rami fxs and the full thickness sacral fx that has clearly displaced more since the original x-ray I fear that her pelvis is unstable and may require a transsacral screw.  ABL anemia -- It does not appear that she is continuing to lose blood as her hgb is stable. I would  only transfuse her again should she have symptoms from her anemia. I do suggest it be checked daily, especially once she is mobilized. Please continue to hold her Plavix and ASA. Delirium -- This seems most likely from the single dose of tramadol she received last night. I have stopped this and adjusted her APAP dosing. Adequate pain control without ADE's may be problematic.    Lisette Abu, PA-C Pager: [phone] General Trauma PA Pager: [phone] 01/19/2016, 10:15 AM

## 2016-01-19 NOTE — Progress Notes (Signed)
NURSING PROGRESS NOTE  Jasmine Torres [id number] Admission Data: 01/19/2016 5:20 AM Attending Provider: Edwin Dada, MD [id number], MD Code Status: Full  Jasmine Torres is a 82 y.o. female patient admitted from ED:  -No acute distress noted.  -No complaints of shortness of breath.  -No complaints of chest pain.   Cardiac Monitoring: Box # 01 in place. Cardiac monitor yields: Atrial fibrillation  Blood pressure 127/54, pulse 106, temperature 98.6 F (36.7 C), temperature source Oral, resp. rate 20, SpO2 97% on 2L [place].  IV Fluids:  IV in place, occlusive dsg intact without redness, IV cath antecubital left, condition patent and no redness normal saline.   Allergies:  Penicillins  Past Medical History:   has a past medical history of Atrial fibrillation ([place]); Hypothyroidism; GERD (gastroesophageal reflux disease); Anxiety; Aortic valve disorders; Essential hypertension, benign; History of TIA (transient ischemic attack); Paraspinal mass; Pulmonary nodule; Carotid artery disease ([place]); Skin cancer; and Rectal bleeding (05/14/2013).  Past Surgical History:   has past surgical history that includes Abdominal hysterectomy; Tonsillectomy; Abdominal hysterectomy; Skin cancer removed; Tonsillectomy; Cataract extraction bilateral w/ anterior vitrectomy; Colonoscopy (N/A, 05/20/2013); and left heart catheterization with coronary angiogram (N/A, 03/06/2013).  Social History:   reports that she has never smoked. She has never used smokeless tobacco. She reports that she does not drink alcohol or use illicit drugs.  Skin: Bruising to bilateral hips/pelvic area as well as L elbow. All areas are open to air.   Patient/Family orientated to room. Information packet given to patient/family. Admission inpatient armband information verified with patient/family to include name and date of birth and placed on patient arm. Side rails up x 2, fall assessment and education completed with patient/family.  Patient/family able to verbalize understanding of risk associated with falls and verbalized understanding to call for assistance before getting out of bed. Call light within reach. Patient/family able to voice and demonstrate understanding of unit orientation instructions.

## 2016-01-19 NOTE — Progress Notes (Signed)
Pt. Daughter, Richardine Service [phone] would like ortho and PT to call with an update.

## 2016-01-19 NOTE — Consult Note (Signed)
This patient was admitted on 1/28 and Dr. [name] was consulted for these fractures. I will defer care to him. I have asked RN to call on call for Dr. [name].    Please notify me if I can be of any further assistance or a 2nd opinion is needed.     Jasmine Torres D Cell: [phone]

## 2016-01-19 NOTE — Care Management Note (Signed)
Case Management Note  Patient Details  Name: Jasmine Torres MRN: [id number] Date of Birth: 05-Dec-1925  Subjective/Objective:                 From SNF/REHAB presents with a hgb of 6.0, past medical history significant for Afib on clopidogrel and aspirin, HTN, hypothyroidism and recent pubic ramus fx .       Action/Plan: Per Trauma, Dr. Marcelino Scot to consult. Given the extent of the rami fxs and the full thickness sacral fx that has clearly displaced more since the original x-ray fear that her pelvis is unstable and may require a transsacral screw.  CM to f/u with disposition needs.  Expected Discharge Date:   (unknown)               Expected Discharge Plan:  [place] (From [employer])  In-House Referral:  Clinical Social Work  Discharge planning Services  CM Consult  Post Acute Care Choice:    Choice offered to:     DME Arranged:    DME Agency:     HH Arranged:    [place] Agency:     Status of Service:  In process, will continue to follow  Medicare Important Message Given:    Date Medicare IM Given:    Medicare IM give by:    Date Additional Medicare IM Given:    Additional Medicare Important Message give by:     If discussed at [place] of Stay Meetings, dates discussed:    Additional Comments: Richardine Service (Daughter) [phone]  [place], [place] [phone] 01/19/2016, 10:33 AM

## 2016-01-20 LAB — BASIC METABOLIC PANEL
ANION GAP: 10 (ref 5–15)
BUN: 12 mg/dL (ref 6–20)
CALCIUM: 8.1 mg/dL — AB (ref 8.9–10.3)
CO2: 22 mmol/L (ref 22–32)
Chloride: 97 mmol/L — ABNORMAL LOW (ref 101–111)
Creatinine, Ser: 0.73 mg/dL (ref 0.44–1.00)
GFR calc Af Amer: >60 mL/min (ref >60)
Glucose, Bld: 98 mg/dL (ref 65–99)
POTASSIUM: 3.9 mmol/L (ref 3.5–5.1)
SODIUM: 129 mmol/L — AB (ref 135–145)

## 2016-01-20 LAB — CBC
HEMATOCRIT: 23.9 % — AB (ref 36.0–46.0)
HEMOGLOBIN: 8.2 g/dL — AB (ref 12.0–15.0)
MCH: 31.2 pg (ref 26.0–34.0)
MCHC: 34.3 g/dL (ref 30.0–36.0)
MCV: 90.9 fL (ref 78.0–100.0)
Platelets: 162 10*3/uL (ref 150–400)
RBC: 2.63 MIL/uL — AB (ref 3.87–5.11)
RDW: 16 % — ABNORMAL HIGH (ref 11.5–15.5)
WBC: 6.9 10*3/uL (ref 4.0–10.5)

## 2016-01-20 MED ORDER — HYDROCODONE-ACETAMINOPHEN 5-325 MG PO TABS
1.0000 | ORAL_TABLET | ORAL | Status: DC | PRN
Start: 2016-01-20 — End: 2016-01-21
  Administered 2016-01-20 – 2016-01-21 (×4): 1 via ORAL
  Filled 2016-01-20 (×4): qty 1

## 2016-01-20 MED ORDER — POLYETHYLENE GLYCOL 3350 17 G PO PACK
17.0000 g | PACK | Freq: Two times a day (BID) | ORAL | Status: DC
  Administered 2016-01-20 – 2016-01-21 (×2): 17 g via ORAL
  Filled 2016-01-20 (×2): qty 1

## 2016-01-20 NOTE — NC FL2 (Signed)
  [place] LEVEL OF CARE SCREENING TOOL     IDENTIFICATION  Patient Name: Jasmine Torres Birthdate: 05/24/1925 Sex: female Admission Date (Current Location): 01/18/2016  [facility] and [place] Number:  [employer] and Address:  [address] [facility], [place] [address], [place], [place] [address]      Provider Number: [id number]  Attending Physician Name and Address:  Charlynne Cousins, MD  Relative Name and Phone Number:  [name], daughter, [phone]    Current Level of Care: Hospital Recommended Level of Care: [place] Prior Approval Number:    Date Approved/Denied:   PASRR Number:    Discharge Plan: SNF    Current Diagnoses: Patient Active Problem List   Diagnosis Date Noted  . Pelvic hematoma, female 01/18/2016  . Acute blood loss anemia 01/18/2016  . Thrombocytopenia ([place]) 01/18/2016  . Hyponatremia 01/18/2016  . Bilateral pubic rami fractures ([place]) 01/14/2016  . Precordial pain 04/15/2014  . Chronic atrial fibrillation ([place]) 06/03/2013  . Hypothyroidism 03/26/2012  . Essential hypertension, benign 03/27/2010  . HYPERLIPIDEMIA-MIXED 10/03/2009    Orientation RESPIRATION BLADDER Height & Weight     Self  Normal Continent Weight: 131 lb 9.8 oz (59.7 kg) Height:  5\' 5"  (165.1 cm)  BEHAVIORAL SYMPTOMS/MOOD NEUROLOGICAL BOWEL NUTRITION STATUS   (N/A)   Continent  (Please see DC summary)  AMBULATORY STATUS COMMUNICATION OF NEEDS Skin   Extensive Assist Verbally Normal                       Personal Care Assistance Level of Assistance  Bathing, Feeding, Dressing Bathing Assistance: Maximum assistance Feeding assistance: Limited assistance Dressing Assistance: Limited assistance     Functional Limitations Info      Hearing Info: Impaired      SPECIAL CARE FACTORS FREQUENCY  PT (By licensed PT), OT (By licensed OT)     PT Frequency: 5x/week OT Frequency: min 3x/week            Contractures       Additional Factors Info  Code Status, Allergies Code Status Info: Full Allergies Info: Penicillins           Current Medications (01/20/2016):  This is the current hospital active medication list Current Facility-Administered Medications  Medication Dose Route Frequency Provider Last Rate Last Dose  . 0.9 %  sodium chloride infusion   Intravenous Once Deno Etienne, DO      . haloperidol lactate (HALDOL) injection 1 mg  1 mg Intravenous Q6H PRN Charlynne Cousins, MD      . HYDROcodone-acetaminophen (NORCO/VICODIN) 5-325 MG per tablet 1 tablet  1 tablet Oral Q4H PRN Charlynne Cousins, MD   1 tablet at 01/20/16 1156  . levothyroxine (SYNTHROID, LEVOTHROID) tablet 50 mcg  50 mcg Oral QAC breakfast Edwin Dada, MD   50 mcg at 01/20/16 0748  . metoprolol tartrate (LOPRESSOR) tablet 25 mg  25 mg Oral BID Edwin Dada, MD   25 mg at 01/20/16 1156  . sodium chloride flush (NS) 0.9 % injection 3 mL  3 mL Intravenous Q12H Edwin Dada, MD   3 mL at 01/20/16 1157     Discharge Medications: Please see discharge summary for a list of discharge medications.  Relevant Imaging Results:  Relevant Lab Results:   Additional Information SSN: [id number]  Benard Halsted, LCSWA

## 2016-01-20 NOTE — Evaluation (Signed)
Occupational Therapy Evaluation Patient Details Name: Jasmine Torres MRN: [id number] DOB: 11-07-1925 Today's Date: 01/20/2016    History of Present Illness 82 y.o. female admitted from SNF due to pelvic hematoma.   PMH of atrial fibrillation, hypertension, and TIA s/p fall down stairs 01/14/16   Clinical Impression   Pt is Medicare and current D/C plan is SNF. No apparent immediate acute care OT needs, therefore will defer OT to SNF. Pt returning to SNF this admission. OT ordered GEO MAT to room via materials pending arrival. Recommend OOB for all meals by RN staff daily.       Follow Up Recommendations  SNF    Equipment Recommendations  3 in 1 bedside comode;Other (comment) (RW)    Recommendations for Other Services       Precautions / Restrictions Precautions Precautions: Fall Restrictions Weight Bearing Restrictions: No      Mobility Bed Mobility Overal bed mobility: +2 for physical assistance;Needs Assistance Bed Mobility: Supine to Sit     Supine to sit: +2 for physical assistance;Min assist;HOB elevated     General bed mobility comments: pt reaching for bed rails and needing (A) from pad to pivot hips to EOB  Transfers Overall transfer level: Needs assistance Equipment used: 2 person hand held assist Transfers: Sit to/from Stand Sit to Stand: +2 physical assistance;Min assist         General transfer comment: cues for hand placement. Pt with incr abilty to complete sit<>Stand from 3n1 at min (A)  level     Balance                                            ADL Overall ADL's : Needs assistance/impaired                     Lower Body Dressing: Total assistance Lower Body Dressing Details (indicate cue type and reason): don doff socks Toilet Transfer: Moderate assistance;Stand-pivot;RW;BSC   Toileting- Clothing Manipulation and Hygiene: Total assistance Toileting - Clothing Manipulation Details (indicate cue type and  reason): peri care       General ADL Comments: pt with urgency and incontinence transfering to 3n1. 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      Pertinent Vitals/Pain Pain Assessment: Faces Faces Pain Scale: Hurts whole lot Pain Location: pelvic area with sitting / standing Pain Descriptors / Indicators: Grimacing Pain Intervention(s): Repositioned;Monitored during session     Hand Dominance Right   Extremity/Trunk Assessment Upper Extremity Assessment Upper Extremity Assessment: Overall WFL for tasks assessed   Lower Extremity Assessment Lower Extremity Assessment: Defer to PT evaluation   Cervical / Trunk Assessment Cervical / Trunk Assessment: Kyphotic   Communication Communication Communication: HOH   Cognition Arousal/Alertness: Awake/alert Behavior During Therapy: WFL for tasks assessed/performed Overall Cognitive Status: Within Functional Limits for tasks assessed                     General Comments       Exercises       Shoulder Instructions      Home Living Family/patient expects to be discharged to:: Skilled nursing facility  Prior Functioning/Environment Level of Independence: Independent        Comments: pta fall patient was independent with all adls    OT Diagnosis: Generalized weakness;Acute pain   OT Problem List:     OT Treatment/Interventions:      OT Goals(Current goals can be found in the care plan section) Acute Rehab OT Goals Patient Stated Goal: home OT Goal Formulation: All assessment and education complete, DC therapy Potential to Achieve Goals: Good  OT Frequency:     Barriers to D/C:            Co-evaluation PT/OT/SLP Co-Evaluation/Treatment: Yes Reason for Co-Treatment: Complexity of the patient's impairments (multi-system involvement);For patient/therapist safety   OT goals addressed during session: ADL's  and self-care;Strengthening/ROM      End of Session Equipment Utilized During Treatment: Gait belt;Rolling walker Nurse Communication: Mobility status;Precautions  Activity Tolerance: Patient tolerated treatment well Patient left: in chair;with call bell/phone within reach;with chair alarm set;with family/visitor present   Time: 1100-1127 OT Time Calculation (min): 27 min Charges:  OT General Charges $OT Visit: 1 Procedure OT Evaluation $OT Eval Moderate Complexity: 1 Procedure G-Codes:    Jasmine Torres Feb 02, 2016, 11:38 AM   Jasmine Torres   OTR/L PagerIP:[phone] Office: [phone] .

## 2016-01-20 NOTE — Clinical Social Work Note (Signed)
Clinical Social Work Assessment  Patient Details  Name: Jasmine Torres MRN: [id number] Date of Birth: 06-Feb-1925  Date of referral:  01/20/16               Reason for consult:  Facility Placement                Permission sought to share information with:  Facility Sport and exercise psychologist, Family Supports Permission granted to share information::  Yes, Verbal Permission Granted  Name::     Deveron Furlong  Agency::  [facility]  Relationship::  Daughter  Contact Information:  [phone]  Housing/Transportation Living arrangements for the past 2 months:  [place], [place] of Information:  Adult Children Patient Interpreter Needed:  None Criminal Activity/Legal Involvement Pertinent to Current Situation/Hospitalization:  No - Comment as needed Significant Relationships:  Adult Children Lives with:  Self Do you feel safe going back to the place where you live?  Yes Need for family participation in patient care:  Yes (Comment)  Care giving concerns:  CSW received referral regarding discharge planning needs. CSW spoke with patient's daughter regarding discharge plan. Patient's daughter confirmed that her mother would be returning to [facility] upon discharge from the hospital. CSW answered questions from daughter about Medicare rules. CSW to continue to follow for discharge needs.   Social Worker assessment / plan:  Patient to discharge back to [place] by PTAR.  Employment status:  Retired Forensic scientist:  Medicare PT Recommendations:  [place] / Referral to community resources:  [place]  Patient/Family's Response to care:  Daughter expressed understanding of discharge plan.  Patient/Family's Understanding of and Emotional Response to Diagnosis, Current Treatment, and Prognosis:  No questions/concerns.   Emotional Assessment Appearance:  Appears stated age Attitude/Demeanor/Rapport:  Unable to  Assess Affect (typically observed):  Unable to Assess Orientation:  Oriented to Self Alcohol / Substance use:  Not Applicable Psych involvement (Current and /or in the community):  No (Comment)  Discharge Needs  Concerns to be addressed:  Care Coordination Readmission within the last 30 days:  Yes Current discharge risk:  None Barriers to Discharge:  Continued Medical Work up   [employer], [place] 01/20/2016, 4:34 PM

## 2016-01-20 NOTE — Evaluation (Signed)
Physical Therapy Evaluation Patient Details Name: Jasmine Torres MRN: [id number] DOB: 04-25-1925 Today's Date: 01/20/2016   History of Present Illness  82 y.o. female admitted from SNF due to pelvic hematoma.   PMH of atrial fibrillation, hypertension, and TIA s/p fall down stairs 01/14/16  Clinical Impression  Pt admitted with above diagnosis and presents to PT with functional limitations due to deficits listed below (See PT problem list). Pt needs skilled PT to maximize independence and safety to allow discharge to ST-SNF prior to return home. Pt was very independent prior to recent pelvic fx and expect she will make steady progress.     Follow Up Recommendations SNF    Equipment Recommendations  Rolling walker with 5" wheels    Recommendations for Other Services       Precautions / Restrictions Precautions Precautions: Fall Restrictions Weight Bearing Restrictions: No Other Position/Activity Restrictions: WBAT per Dr. [name] (EDP consult in Dr. Quincy Sheehan notes)      Mobility  Bed Mobility Overal bed mobility: +2 for physical assistance;Needs Assistance Bed Mobility: Supine to Sit     Supine to sit: +2 for physical assistance;Min assist;HOB elevated     General bed mobility comments: pt reaching for bed rails and needing (A) from pad to pivot hips to EOB  Transfers Overall transfer level: Needs assistance Equipment used: Rolling walker (2 wheeled);2 person hand held assist Transfers: Sit to/from [facility] Sit to Stand: Min assist Stand pivot transfers: +2 physical assistance;Min assist       General transfer comment: Verbal cues for hand placement. 2 person assist when performing stand pivot bed to bsc without walker. For sit to stand from [facility] with walker only min A.   Ambulation/Gait Ambulation/Gait assistance: Min assist Ambulation Distance (Feet): 8 Feet Assistive device: Rolling walker (2 wheeled) Gait Pattern/deviations: Step-to  pattern;Decreased step length - right;Decreased step length - left;Trunk flexed Gait velocity: decreased Gait velocity interpretation: <1.8 ft/sec, indicative of risk for recurrent falls General Gait Details: Assist for balance and support. SpO2 96% on RA with amb. Left O2 off.  Stairs            Wheelchair Mobility    Modified Rankin (Stroke Patients Only)       Balance Overall balance assessment: Needs assistance Sitting-balance support: No upper extremity supported;Feet supported Sitting balance-Leahy Scale: Fair     Standing balance support: Bilateral upper extremity supported Standing balance-Leahy Scale: Poor Standing balance comment: Walker and min A for static standing                             Pertinent Vitals/Pain Pain Assessment: Faces Faces Pain Scale: Hurts whole lot Pain Location: pelvis with sitting/standing Pain Descriptors / Indicators: Grimacing Pain Intervention(s): Limited activity within patient's tolerance;Repositioned    Home Living Family/patient expects to be discharged to:: Skilled nursing facility                      Prior Function Level of Independence: Independent         Comments: pta fall patient was independent with all adls     Hand Dominance   Dominant Hand: Right    Extremity/Trunk Assessment   Upper Extremity Assessment: Defer to OT evaluation           Lower Extremity Assessment: Generalized weakness;RLE deficits/detail;LLE deficits/detail RLE Deficits / Details: grossly 3- to 3/5 due to pain limitations LLE Deficits / Details: grossly 3-  to 3/5 due to pain limitations  Cervical / Trunk Assessment: Kyphotic  Communication   Communication: HOH  Cognition Arousal/Alertness: Awake/alert Behavior During Therapy: WFL for tasks assessed/performed Overall Cognitive Status: Within Functional Limits for tasks assessed                      General Comments General comments (skin  integrity, edema, etc.): noted bruising on L rib area    Exercises        Assessment/Plan    PT Assessment Patient needs continued PT services  PT Diagnosis Difficulty walking;Generalized weakness;Acute pain   PT Problem List Decreased strength;Decreased activity tolerance;Decreased balance;Decreased mobility;Decreased knowledge of use of DME;Pain  PT Treatment Interventions DME instruction;Gait training;Functional mobility training;Therapeutic activities;Therapeutic exercise;Balance training;Patient/family education   PT Goals (Current goals can be found in the Care Plan section) Acute Rehab PT Goals Patient Stated Goal: home PT Goal Formulation: With patient Time For Goal Achievement: 01/27/16 Potential to Achieve Goals: Good    Frequency Min 3X/week   Barriers to discharge        Co-evaluation PT/OT/SLP Co-Evaluation/Treatment: Yes Reason for Co-Treatment: Complexity of the patient's impairments (multi-system involvement);For patient/therapist safety PT goals addressed during session: Mobility/safety with mobility;Proper use of DME OT goals addressed during session: ADL's and self-care;Strengthening/ROM       End of Session Equipment Utilized During Treatment: Gait belt Activity Tolerance: Patient limited by fatigue;Patient limited by pain Patient left: in chair;with call bell/phone within reach;with chair alarm set;with family/visitor present Nurse Communication: Mobility status;Other (comment) (Left O2 off)         Time: [id number] PT Time Calculation (min) (ACUTE ONLY): 37 min   Charges:   PT Evaluation $PT Eval Moderate Complexity: 1 Procedure     PT G Codes:        Gilma Bessette 2016-02-07, 12:00 PM [place]

## 2016-01-20 NOTE — Progress Notes (Signed)
Orthopedics Progress Note  Subjective: Patient comfortable currently but did have pain in the groin with mobilization  Objective:  Filed Vitals:   01/20/16 0546 01/20/16 1342  BP: 116/50 120/51  Pulse: 77 103  Temp: 98.8 F (37.1 C) 98.8 F (37.1 C)  Resp: 18 20    General: Awake and alert  Musculoskeletal: patient with good AROM at the foot and ankle Bilaterally, NVI Neurovascularly intact  Lab Results  Component Value Date   WBC 6.9 01/20/2016   HGB 8.2* 01/20/2016   HCT 23.9* 01/20/2016   MCV 90.9 01/20/2016   PLT 162 01/20/2016       Component Value Date/Time   NA 129* 01/20/2016 0556   K 3.9 01/20/2016 0556   CL 97* 01/20/2016 0556   CO2 22 01/20/2016 0556   GLUCOSE 98 01/20/2016 0556   BUN 12 01/20/2016 0556   CREATININE 0.73 01/20/2016 0556   CALCIUM 8.1* 01/20/2016 0556   GFRNONAA >60 01/20/2016 0556   GFRAA >60 01/20/2016 0556    Lab Results  Component Value Date   INR 1.01 04/15/2014   INR 1.15 03/06/2013   INR 1.12 06/15/2010    Assessment/Plan:  s/p fall with pelvic ring injury, stable. No surgery indicated or planned.  Recommend strict NWB L LE to protect sacral fx Transfers, pivot on right or sliding and W/C ambulation due to high fall risk  And difficulty maintaining NWB on left Follow up with me in two weeks in the office Call with any questions   Doran Heater. [name], [place] [phone] 01/20/2016 3:45 PM

## 2016-01-20 NOTE — Progress Notes (Signed)
[facility] PROGRESS NOTE    Progress Note   Jasmine Torres [id number] DOB: 1925/06/20 DOA: 01/18/2016 PCP: [name], MD   Brief Narrative:   Jasmine Torres is an 82 y.o. female past medical history of A. fib on aspirin and Plavix and a recent P Graham's fracture who presents with a low hemoglobin  Assessment/Plan:   Pelvic hematoma, female/Acute blood loss anemia: Or therapeutic surgery was consulted on awaiting recommendations. Appreciate surgery's assistance hemoglobin is improving no further workup. Hemoglobin is trending up. Physical therapy evaluation pending probably skilled nursing facility until 2.4.2017  Essential hypertension, benign: Continue metoprolol.  Chronic atrial fibrillation ([place]): CHADs2Vasc 7, Not on warfarin due to patient preference.  Gen. hold antiplatelet therapy for 1 week. Continue metoprolol.  Hypothyroidism: TSH pending.  Mild Thrombocytopenia ([place]): Mild drop in platelet count we'll continue to trend.   Hyponatremia: Likely prerenal improving with IV hydration.   Acute confusional state: Narcotics for pain, use Haldol when necessary, for agitation.    DVT Prophylaxis - SCD's.  Family Communication: none Disposition Plan: SNF in am. Code Status:     Code Status Orders        Start     Ordered   01/18/16 2234  Full code   Continuous     01/18/16 2233    Code Status History    Date Active Date Inactive Code Status Order ID Comments User Context   01/14/2016  4:58 PM 01/17/2016  5:28 PM Full Code [id number]  Venetia Maxon Rama, MD Inpatient        IV Access:    Peripheral IV   Procedures and diagnostic studies:   Ct Head Wo Contrast  01/18/2016  CLINICAL DATA:  Right-sided headache.  Recent fall.  Anemia. EXAM: CT HEAD WITHOUT CONTRAST TECHNIQUE: Contiguous axial images were obtained from the base of the skull through the vertex without intravenous contrast. COMPARISON:  01/14/2016 head CT. FINDINGS: No evidence  of parenchymal hemorrhage or extra-axial fluid collection. No mass lesion, mass effect, or midline shift. No CT evidence of acute infarction. Intracranial atherosclerosis. Nonspecific stable mild subcortical and periventricular white matter hypodensity, most in keeping with chronic small vessel ischemic change. Stable normal variant dilated perivascular space in the inferior left basal ganglia. Stable diffuse cerebral volume loss. No ventriculomegaly. The visualized paranasal sinuses are essentially clear. The mastoid air cells are unopacified. No evidence of calvarial fracture. IMPRESSION: 1.  No evidence of acute intracranial abnormality. 2. Stable cerebral atrophy and mild chronic small vessel ischemic white matter change. Electronically Signed   By: Ilona Sorrel M.D.   On: 01/18/2016 18:36   Ct Abdomen Pelvis W Contrast  01/18/2016  CLINICAL DATA:  Recent fall.  Presentation with anemia. EXAM: CT ABDOMEN AND PELVIS WITH CONTRAST TECHNIQUE: Multidetector CT imaging of the abdomen and pelvis was performed using the standard protocol following bolus administration of intravenous contrast. CONTRAST:  58mL OMNIPAQUE IOHEXOL 300 MG/ML  SOLN COMPARISON:  CT 11/20/2005 FINDINGS: There is a small left effusion layering dependently with mild atelectasis at the lung bases. No evidence of consolidation. Tiny amount of pericardial fluid. The pleural fluid is not hyper dense. There are scattered hypodensities within the liver, the largest measuring 16 mm in the right lobe. Those visible on the delayed imaging do not enhance in therefore there likely to represent benign cysts. There are no worrisome foci. No evidence of liver injury. No calcified gallstones. The gallbladder is collapsed. The spleen is normal without evidence of injury. Small accessory spleen.  The adrenal glands are normal. The kidneys contain several benign appearing cysts. The aorta and its branch vessels show atherosclerosis but there is no aneurysm. The  IVC is normal. There is a peripherally calcified low-density lymph node adjacent to the aorta at the thoracic inlet measuring 14 x 20 mm. This was present in 2006 and is benign. There is mild retroperitoneal stranding on the right inferior to the kidney. This is nonspecific but consistent with a small amount of retroperitoneal bleeding. There are rami are fractures bilaterally more displaced on the left. There are left sacral fractures. No femur fracture. Hematoma along the left pelvic sidewall measures approximately 10 x 5 x 5 cm, volume estimated at 120 cc. Small amount of blood also probably present in the presacral space. No spinal fracture. Mass effect upon the bladder. No evidence of primary bladder injury. No acute bowel finding. IMPRESSION: Bilateral rami fractures, more displaced on the left. Left sacral fractures. Left pelvic sidewall hematoma with estimated volume of 120 cc. More indistinct extraperitoneal bleeding in the presacral space and in the right lower retroperitoneum. No evidence of organ injury. Small low-density pleural effusion on the left with mild dependent atelectasis. Chronic benign lymph node or cyst adjacent to the aorta at the thoracic inlet. Benign appearing liver cysts and renal cysts. Atherosclerosis of the aorta and its branch vessels. Electronically Signed   By: Nelson Chimes M.D.   On: 01/18/2016 16:31     Medical Consultants:    None.  Anti-Infectives:   Anti-infectives    None      Subjective:    Jasmine Torres no complaints today dose.  Objective:    Filed Vitals:   01/19/16 1535 01/19/16 2043 01/20/16 0546 01/20/16 0946  BP: 97/47 102/43 116/50   Pulse: 85 79 77   Temp:  99.8 F (37.7 C) 98.8 F (37.1 C)   TempSrc:  Oral Oral   Resp: 17 18 18    Height:    5\' 5"  (1.651 m)  Weight:    59.7 kg (131 lb 9.8 oz)  SpO2: 96% 94% 99%     Intake/Output Summary (Last 24 hours) at 01/20/16 1031 Last data filed at 01/20/16 0948  Gross per 24 hour    Intake     60 ml  Output   1475 ml  Net  -1415 ml   Filed Weights   01/20/16 0946  Weight: 59.7 kg (131 lb 9.8 oz)    Exam: Gen:  NAD Cardiovascular:  RRR. Chest and lungs:   CTAB Abdomen:  Abdomen soft, NT/ND, + BS Extremities:  No edema   Data Reviewed:    Labs: Basic Metabolic Panel:  Recent Labs Lab 01/14/16 1240 01/18/16 1546 01/18/16 1554 01/20/16 0556  NA 137 129* 127* 129*  K 4.0 3.9 3.9 3.9  CL 102 94* 90* 97*  CO2 26 26  --  22  GLUCOSE 115* 158* 158* 98  BUN 16 16 16 12   CREATININE 0.98 0.91 0.90 0.73  CALCIUM 8.9 8.8*  --  8.1*   GFR Estimated Creatinine Clearance: 43.7 mL/min (by C-G formula based on Cr of 0.73). Liver Function Tests:  Recent Labs Lab 01/14/16 1240  AST 26  ALT 19  ALKPHOS 33*  BILITOT 1.3*  PROT 5.8*  ALBUMIN 3.7   No results for input(s): LIPASE, AMYLASE in the last 168 hours. No results for input(s): AMMONIA in the last 168 hours. Coagulation profile No results for input(s): INR, PROTIME in the last 168 hours.  CBC:  Recent Labs Lab 01/14/16 1240 01/18/16 1546 01/18/16 1554 01/19/16 0039 01/19/16 0913 01/19/16 2025 01/20/16 0556  WBC 9.4 8.3  --   --  8.0 6.5 6.9  NEUTROABS 7.7 6.1  --   --   --   --   --   HGB 12.2 6.5* 6.8* 7.7* 8.1* 8.0* 8.2*  HCT 37.4 18.7* 20.0* 21.9* 23.2* 23.5* 23.9*  MCV 94.4 94.4  --   --  89.9 91.1 90.9  PLT 169 133*  --   --  133* 145* 162   Cardiac Enzymes: No results for input(s): CKTOTAL, CKMB, CKMBINDEX, TROPONINI in the last 168 hours. BNP (last 3 results) No results for input(s): PROBNP in the last 8760 hours. CBG:  Recent Labs Lab 01/16/16 0939  GLUCAP 141*   D-Dimer: No results for input(s): DDIMER in the last 72 hours. Hgb A1c: No results for input(s): HGBA1C in the last 72 hours. Lipid Profile: No results for input(s): CHOL, HDL, LDLCALC, TRIG, CHOLHDL, LDLDIRECT in the last 72 hours. Thyroid function studies: No results for input(s): TSH, T4TOTAL,  T3FREE, THYROIDAB in the last 72 hours.  Invalid input(s): FREET3 Anemia work up: No results for input(s): VITAMINB12, FOLATE, FERRITIN, TIBC, IRON, RETICCTPCT in the last 72 hours. Sepsis Labs:  Recent Labs Lab 01/18/16 1546 01/19/16 0913 01/19/16 2025 01/20/16 0556  WBC 8.3 8.0 6.5 6.9   Microbiology Recent Results (from the past 240 hour(s))  MRSA PCR Screening     Status: None   Collection Time: 01/18/16 11:37 PM  Result Value Ref Range Status   MRSA by PCR NEGATIVE NEGATIVE Final    Comment:        The GeneXpert MRSA Assay (FDA approved for NASAL specimens only), is one component of a comprehensive MRSA colonization surveillance program. It is not intended to diagnose MRSA infection nor to guide or monitor treatment for MRSA infections.      Medications:   . sodium chloride   Intravenous Once  . levothyroxine  50 mcg Oral QAC breakfast  . metoprolol tartrate  25 mg Oral BID  . sodium chloride flush  3 mL Intravenous Q12H   Continuous Infusions:    Time spent: 15 min   LOS: 2 days   Charlynne Cousins  [facility] Pager [phone]  *Please refer to [email or web address], password [email or web address] to get updated schedule on who will round on this patient, as hospitalists switch teams weekly. If 7PM-7AM, please contact night-coverage at [email or web address], password [email or web address] for any overnight needs.  01/20/2016, 10:31 AM

## 2016-01-20 NOTE — Care Management Important Message (Signed)
Important Message  Patient Details  Name: Jasmine Torres MRN: [id number] Date of Birth: 09-10-1925   Medicare Important Message Given:  Yes    Danine Hor [place] 01/20/2016, 11:24 AM

## 2016-01-20 NOTE — Progress Notes (Signed)
Patient ID: Jasmine Torres, female   DOB: 03-28-1925, 82 y.o.   MRN: [id number]  Pt's hgb stable this morning. Trauma will sign off. Recommend holding Plavix/ASA for 1 month.  Please call with questions.    Lisette Abu, PA-C Pager: [phone] General Trauma PA Pager: [phone]

## 2016-01-20 NOTE — Progress Notes (Signed)
Orthopedics Progress Note   Objective:  Filed Vitals:   01/19/16 2043 01/20/16 0546  BP: 102/43 116/50  Pulse: 79 77  Temp: 99.8 F (37.7 C) 98.8 F (37.1 C)  Resp: 18 18    none  Lab Results  Component Value Date   WBC 6.9 01/20/2016   HGB 8.2* 01/20/2016   HCT 23.9* 01/20/2016   MCV 90.9 01/20/2016   PLT 162 01/20/2016       Component Value Date/Time   NA 129* 01/20/2016 0556   K 3.9 01/20/2016 0556   CL 97* 01/20/2016 0556   CO2 22 01/20/2016 0556   GLUCOSE 98 01/20/2016 0556   BUN 12 01/20/2016 0556   CREATININE 0.73 01/20/2016 0556   CALCIUM 8.1* 01/20/2016 0556   GFRNONAA >60 01/20/2016 0556   GFRAA >60 01/20/2016 0556    Lab Results  Component Value Date   INR 1.01 04/15/2014   INR 1.15 03/06/2013   INR 1.12 06/15/2010    Assessment/Plan: Asked by the admitting team for advice concerning recommendations for care for the pelvic fracture. Patient has left sacral fracture and bilateral pubic ramus fractures. NWB on L LE, and WBAT on R LE. I would recommend sliding transfers with assistance and wheel chair ambulation for 6 weeks due to high fall risk. Given size of hematoma and possible ongoing diffuse bleeding per CT, would hold Plavix for 3 days and then restart if Hgb stable.  I would think it would be ok to continue low dose aspirin.                                                                 Doran Heater. [name], MD 01/20/2016 1:28 PM

## 2016-01-21 MED ORDER — POLYETHYLENE GLYCOL 3350 17 G PO PACK: 17.0000 g | PACK | Freq: Two times a day (BID) | ORAL | Status: DC

## 2016-01-21 MED ORDER — SODIUM CHLORIDE 0.9 % IV BOLUS (SEPSIS)
500.0000 mL | Freq: Once | INTRAVENOUS | Status: AC
  Administered 2016-01-21: 500 mL via INTRAVENOUS

## 2016-01-21 MED ORDER — ASPIRIN EC 81 MG PO TBEC: 81.0000 mg | DELAYED_RELEASE_TABLET | Freq: Every day | ORAL | Status: DC

## 2016-01-21 MED ORDER — CLOPIDOGREL BISULFATE 75 MG PO TABS: 75.0000 mg | ORAL_TABLET | Freq: Every day | ORAL | Status: DC

## 2016-01-21 MED ORDER — HYDROCODONE-ACETAMINOPHEN 5-325 MG PO TABS: 1.0000 | ORAL_TABLET | ORAL | Status: DC | PRN

## 2016-01-21 MED ORDER — METOPROLOL TARTRATE 25 MG PO TABS: 25.0000 mg | ORAL_TABLET | Freq: Two times a day (BID) | ORAL | Status: AC

## 2016-01-21 NOTE — Progress Notes (Signed)
RN called [facility] to give report. RN was told that Nurse receiving pt is unavailable and for RN to call back in 30 minutes. EMS Transport is here to pick up pt. RN will attempt to give report again in 20 minutes.  Dorita Fray 01/21/2016 2:19 PM

## 2016-01-21 NOTE — Discharge Summary (Signed)
Physician Discharge Summary  Jasmine Torres [id number] DOB: [birth year] DOA: 01/18/2016  PCP: [name], MD  Admit date: 01/18/2016 Discharge date: 01/21/2016  Time spent: 35 minutes  Recommendations for Outpatient Follow-up:  1. Follow up with Dr. [name] in 2 week 2. Will go to  SNF [facility]   Discharge Diagnoses:  Principal Problem:   Pelvic hematoma, female Active Problems:   Essential hypertension, benign   Hypothyroidism   Chronic atrial fibrillation (HCC)   Bilateral pubic rami fractures (HCC)   Acute blood loss anemia   Thrombocytopenia (HCC)   Hyponatremia   Discharge Condition: guarded  Diet recommendation: regular  Filed Weights   01/20/16 0946  Weight: 59.7 kg (131 lb 9.8 oz)    History of present illness:  82 y.o. female with a past medical history significant for Afib on clopidogrel and aspirin, HTN, hypothyroidism and recent pubic ramus fx who presents with low Hgb  Hospital Course:  Pelvic hematoma/blood loss anemia: Her aspirin and Plavix were held she has a history of pubic ramus fracture bilateral with mild displacement, orthopedic surgery was consulted who recommended conservative management, and also recommended to hold aspirin and Plavix for a week. We'll need a CBC in 1 weeks  Essential hypertension, benign: Continue metoprolol.  Chronic atrial fibrillation ([place]): CHADs2Vasc 7, Not on warfarin due to patient preference.  Gen. Surgery hold antiplatelet therapy for 1 week. Continue metoprolol.  Hypothyroidism:   Mild Thrombocytopenia ([place]): Has resolved likely due to bleed.  Hyponatremia: Likely prerenal improving with IV hydration.  Procedures:  CT abd and pelvis  Consultations:  Orthopedics  Gen surgery  Discharge Exam: Filed Vitals:   01/21/16 0635 01/21/16 0932  BP: 137/80 141/93  Pulse: 84 132  Temp: 99 F (37.2 C)   Resp: 18     General: A&O x3 Cardiovascular: RRR Respiratory: good air movement CTA  B/L  Discharge Instructions   Discharge Instructions    Diet - low sodium heart healthy    Complete by:  As directed      Increase activity slowly    Complete by:  As directed           Current Discharge Medication List    START taking these medications   Details  HYDROcodone-acetaminophen (NORCO/VICODIN) 5-325 MG tablet Take 1 tablet by mouth every 4 (four) hours as needed for moderate pain. Qty: 30 tablet, Refills: 0    metoprolol tartrate (LOPRESSOR) 25 MG tablet Take 1 tablet (25 mg total) by mouth 2 (two) times daily. Qty: 30 tablet, Refills: 0    polyethylene glycol (MIRALAX / GLYCOLAX) packet Take 17 g by mouth 2 (two) times daily. Qty: 14 each, Refills: 0      CONTINUE these medications which have CHANGED   Details  aspirin EC 81 MG tablet Take 1 tablet (81 mg total) by mouth daily.    clopidogrel (PLAVIX) 75 MG tablet Take 1 tablet (75 mg total) by mouth daily.      CONTINUE these medications which have NOT CHANGED   Details  Calcium-Magnesium-Vitamin D (CALCIUM MAGNESIUM PO) Take 1 tab by mouth every morning & 2 tabs at bedtime    levothyroxine (SYNTHROID, LEVOTHROID) 50 MCG tablet Take 50 mcg by mouth daily before breakfast.    Omega-3 Fatty Acids (FISH OIL) 1200 MG CAPS Take 2 capsules by mouth every morning.     pantoprazole (PROTONIX) 20 MG tablet Take 20 mg by mouth daily as needed for heartburn.    acetaminophen (TYLENOL) 325 MG tablet Take  2 tablets (650 mg total) by mouth every 6 (six) hours as needed for mild pain (or Fever >/= 101). Qty: 30 tablet, Refills: 0      STOP taking these medications     traMADol (ULTRAM) 50 MG tablet        Allergies  Allergen Reactions  . Penicillins Other (See Comments)    Has patient had a PCN reaction causing immediate rash, facial/tongue/throat swelling, SOB or lightheadedness with hypotension: unknown Has patient had a PCN reaction causing severe rash involving mucus membranes or skin necrosis:  unknown Has patient had a PCN reaction that required hospitalization unknown Has patient had a PCN reaction occurring within the last 10 years: no If all of the above answers are "NO", then may proceed with Cephalosporin use.    Follow-up Information    Follow up with NORRIS,STEVEN R, MD. Call in 2 weeks.   Specialty:  Orthopedic Surgery   Why:  [phone]   Contact information:   [address] [place] [address] [phone]        The results of significant diagnostics from this hospitalization (including imaging, microbiology, ancillary and laboratory) are listed below for reference.    Significant Diagnostic Studies: Dg Chest 2 View  01/14/2016  CLINICAL DATA:  Fall from bottom step with chest pain, initial encounter EXAM: CHEST  2 VIEW COMPARISON:  06/05/2015 FINDINGS: Cardiac shadow is within normal limits. The lungs are well aerated bilaterally. No focal infiltrate or sizable effusion is seen. No acute bony abnormality is noted. IMPRESSION: No active cardiopulmonary disease. Electronically Signed   By: Inez Catalina M.D.   On: 01/14/2016 14:03   Dg Thoracic Spine 2 View  01/14/2016  CLINICAL DATA:  Fall from stairs with upper back pain, initial encounter EXAM: THORACIC SPINE 2 VIEWS COMPARISON:  None. FINDINGS: Vertebral body height is well maintained. Mild osteophytic changes are seen. The pedicles are within normal limits and no paraspinal mass lesion is noted. Biapical pleural parenchymal scarring is seen. IMPRESSION: No acute bony abnormality noted. Electronically Signed   By: Inez Catalina M.D.   On: 01/14/2016 13:54   Dg Lumbar Spine Complete  01/14/2016  CLINICAL DATA:  Fall from stairs with low back pain, initial encounter EXAM: LUMBAR SPINE - COMPLETE 4+ VIEW COMPARISON:  11/20/2005 FINDINGS: Five lumbar type vertebral bodies are well visualized. Vertebral body height is well maintained. Mild osteophytic changes are seen. No spondylolisthesis is noted. No  other focal abnormality is seen. IMPRESSION: Mild degenerative change without acute abnormality per Electronically Signed   By: Inez Catalina M.D.   On: 01/14/2016 14:00   Dg Elbow Complete Left  01/14/2016  CLINICAL DATA:  [place] off bottom step now with left elbow pain. EXAM: LEFT ELBOW - COMPLETE 3+ VIEW COMPARISON:  None. FINDINGS: There is marked soft tissue swelling about the olecranon process, however this finding is without associated fracture, radiopaque foreign body or elbow joint effusion. Joint spaces are preserved. IMPRESSION: Marked soft tissue swelling about the olecranon process without associated fracture, radiopaque foreign body or elbow joint effusion. Electronically Signed   By: Sandi Mariscal M.D.   On: 01/14/2016 13:54   Ct Head Wo Contrast  01/18/2016  CLINICAL DATA:  Right-sided headache.  Recent fall.  Anemia. EXAM: CT HEAD WITHOUT CONTRAST TECHNIQUE: Contiguous axial images were obtained from the base of the skull through the vertex without intravenous contrast. COMPARISON:  01/14/2016 head CT. FINDINGS: No evidence of parenchymal hemorrhage or extra-axial fluid collection. No mass  lesion, mass effect, or midline shift. No CT evidence of acute infarction. Intracranial atherosclerosis. Nonspecific stable mild subcortical and periventricular white matter hypodensity, most in keeping with chronic small vessel ischemic change. Stable normal variant dilated perivascular space in the inferior left basal ganglia. Stable diffuse cerebral volume loss. No ventriculomegaly. The visualized paranasal sinuses are essentially clear. The mastoid air cells are unopacified. No evidence of calvarial fracture. IMPRESSION: 1.  No evidence of acute intracranial abnormality. 2. Stable cerebral atrophy and mild chronic small vessel ischemic white matter change. Electronically Signed   By: Ilona Sorrel M.D.   On: 01/18/2016 18:36   Ct Head Wo Contrast  01/14/2016  CLINICAL DATA:  82 year old female with dizziness  and acute fall. EXAM: CT HEAD WITHOUT CONTRAST CT CERVICAL SPINE WITHOUT CONTRAST TECHNIQUE: Multidetector CT imaging of the head and cervical spine was performed following the standard protocol without intravenous contrast. Multiplanar CT image reconstructions of the cervical spine were also generated. COMPARISON:  12/22/2015 and prior head CTs FINDINGS: CT HEAD FINDINGS Mild generalized cerebral volume loss and very mild chronic small-vessel white matter ischemic changes again noted. An enlarged perivascular space just inferior to the left basal ganglia again noted. No acute intracranial abnormalities are identified, including mass lesion or mass effect, hydrocephalus, extra-axial fluid collection, midline shift, hemorrhage, or acute infarction. The visualized bony calvarium is unremarkable. CT CERVICAL SPINE FINDINGS There is no evidence of acute fracture or prevertebral soft tissue swelling. 1.5 mm anterolisthesis of C5 on C6 is likely degenerative. Mild-moderate degenerative disc disease and spondylosis is C5-6 and C6-7 noted. Moderate multilevel facet arthropathy is identified. No focal no focal bony lesions are noted. Biapical pleuroparenchymal scarring is present. IMPRESSION: No evidence of acute intracranial abnormality. Mild atrophy and chronic small-vessel white matter ischemic changes. No evidence of acute cervical spine abnormality. Multilevel degenerative changes as described. Electronically Signed   By: Margarette Canada M.D.   On: 01/14/2016 13:03   Ct Head Wo Contrast  12/22/2015  CLINICAL DATA:  Incoherent speech, TIA EXAM: CT HEAD WITHOUT CONTRAST TECHNIQUE: Contiguous axial images were obtained from the base of the skull through the vertex without intravenous contrast. COMPARISON:  06/05/2015 FINDINGS: There is no evidence of mass effect, midline shift, or extra-axial fluid collections. There is no evidence of a space-occupying lesion or intracranial hemorrhage. There is no evidence of a  cortical-based area of acute infarction. There is a large left subinsular perivascular space. There is generalized cerebral atrophy. There is periventricular white matter low attenuation likely secondary to microangiopathy. The ventricles and sulci are appropriate for the patient's age. The basal cisterns are patent. Visualized portions of the orbits are unremarkable. The visualized portions of the paranasal sinuses and mastoid air cells are unremarkable. The osseous structures are unremarkable. IMPRESSION: No acute intracranial pathology. Electronically Signed   By: Kathreen Devoid   On: 12/22/2015 15:08   Ct Cervical Spine Wo Contrast  01/14/2016  CLINICAL DATA:  82 year old female with dizziness and acute fall. EXAM: CT HEAD WITHOUT CONTRAST CT CERVICAL SPINE WITHOUT CONTRAST TECHNIQUE: Multidetector CT imaging of the head and cervical spine was performed following the standard protocol without intravenous contrast. Multiplanar CT image reconstructions of the cervical spine were also generated. COMPARISON:  12/22/2015 and prior head CTs FINDINGS: CT HEAD FINDINGS Mild generalized cerebral volume loss and very mild chronic small-vessel white matter ischemic changes again noted. An enlarged perivascular space just inferior to the left basal ganglia again noted. No acute intracranial abnormalities are identified, including mass lesion  or mass effect, hydrocephalus, extra-axial fluid collection, midline shift, hemorrhage, or acute infarction. The visualized bony calvarium is unremarkable. CT CERVICAL SPINE FINDINGS There is no evidence of acute fracture or prevertebral soft tissue swelling. 1.5 mm anterolisthesis of C5 on C6 is likely degenerative. Mild-moderate degenerative disc disease and spondylosis is C5-6 and C6-7 noted. Moderate multilevel facet arthropathy is identified. No focal no focal bony lesions are noted. Biapical pleuroparenchymal scarring is present. IMPRESSION: No evidence of acute intracranial  abnormality. Mild atrophy and chronic small-vessel white matter ischemic changes. No evidence of acute cervical spine abnormality. Multilevel degenerative changes as described. Electronically Signed   By: Margarette Canada M.D.   On: 01/14/2016 13:03   Mr Brain Wo Contrast  01/16/2016  CLINICAL DATA:  82 year old female with confusion today after fall. Initial encounter. EXAM: MRI HEAD WITHOUT CONTRAST TECHNIQUE: Multiplanar, multiecho pulse sequences of the brain and surrounding structures were obtained without intravenous contrast. COMPARISON:  Head and cervical spine CT 01/14/2016. [facility] brain MRI 02/28/2012 Report (no images available). FINDINGS: No restricted diffusion to suggest acute infarction. No midline shift, mass effect, evidence of mass lesion, ventriculomegaly, extra-axial collection or acute intracranial hemorrhage. Cervicomedullary junction and pituitary are within normal limits. Major intracranial vascular flow voids are preserved with intracranial artery dolichoectasia. Incidental inferior left basal ganglia dilated perivascular space (normal variant). [place]line Cables and white matter signal is within normal limits for age throughout the brain. No cortical encephalomalacia or chronic cerebral blood products. Visible internal auditory structures appear normal. Trace left mastoid fluid, appears inconsequential. Negative nasopharynx. Negative paranasal sinuses. Postoperative changes to both globes. Negative scalp soft tissues. Normal bone marrow signal. Negative visualized cervical spine. IMPRESSION: No acute intracranial abnormality. Negative for age noncontrast MRI appearance of the brain. Electronically Signed   By: Genevie Ann M.D.   On: 01/16/2016 20:21   Ct Abdomen Pelvis W Contrast  01/18/2016  CLINICAL DATA:  Recent fall.  Presentation with anemia. EXAM: CT ABDOMEN AND PELVIS WITH CONTRAST TECHNIQUE: Multidetector CT imaging of the abdomen and pelvis was performed using the standard  protocol following bolus administration of intravenous contrast. CONTRAST:  49mL OMNIPAQUE IOHEXOL 300 MG/ML  SOLN COMPARISON:  CT 11/20/2005 FINDINGS: There is a small left effusion layering dependently with mild atelectasis at the lung bases. No evidence of consolidation. Tiny amount of pericardial fluid. The pleural fluid is not hyper dense. There are scattered hypodensities within the liver, the largest measuring 16 mm in the right lobe. Those visible on the delayed imaging do not enhance in therefore there likely to represent benign cysts. There are no worrisome foci. No evidence of liver injury. No calcified gallstones. The gallbladder is collapsed. The spleen is normal without evidence of injury. Small accessory spleen. The adrenal glands are normal. The kidneys contain several benign appearing cysts. The aorta and its branch vessels show atherosclerosis but there is no aneurysm. The IVC is normal. There is a peripherally calcified low-density lymph node adjacent to the aorta at the thoracic inlet measuring 14 x 20 mm. This was present in 2006 and is benign. There is mild retroperitoneal stranding on the right inferior to the kidney. This is nonspecific but consistent with a small amount of retroperitoneal bleeding. There are rami are fractures bilaterally more displaced on the left. There are left sacral fractures. No femur fracture. Hematoma along the left pelvic sidewall measures approximately 10 x 5 x 5 cm, volume estimated at 120 cc. Small amount of blood also probably present in the presacral space.  No spinal fracture. Mass effect upon the bladder. No evidence of primary bladder injury. No acute bowel finding. IMPRESSION: Bilateral rami fractures, more displaced on the left. Left sacral fractures. Left pelvic sidewall hematoma with estimated volume of 120 cc. More indistinct extraperitoneal bleeding in the presacral space and in the right lower retroperitoneum. No evidence of organ injury. Small  low-density pleural effusion on the left with mild dependent atelectasis. Chronic benign lymph node or cyst adjacent to the aorta at the thoracic inlet. Benign appearing liver cysts and renal cysts. Atherosclerosis of the aorta and its branch vessels. Electronically Signed   By: Nelson Chimes M.D.   On: 01/18/2016 16:31   Dg Hip Unilat With Pelvis 2-3 Views Left  01/14/2016  CLINICAL DATA:  Fall from stairs with left hip pain, initial encounter EXAM: DG HIP (WITH OR WITHOUT PELVIS) 2-3V LEFT COMPARISON:  None. FINDINGS: Pelvic ring demonstrates fractures through the pubic rami bilaterally involving predominately the right superior and left superior and inferior pubic rami. The proximal femur is within normal limits. No gross soft tissue abnormality is seen. IMPRESSION: Bilateral pubic rami fractures worst on the left. No other focal abnormality is noted. Electronically Signed   By: Inez Catalina M.D.   On: 01/14/2016 14:02    Microbiology: Recent Results (from the past 240 hour(s))  MRSA PCR Screening     Status: None   Collection Time: 01/18/16 11:37 PM  Result Value Ref Range Status   MRSA by PCR NEGATIVE NEGATIVE Final    Comment:        The GeneXpert MRSA Assay (FDA approved for NASAL specimens only), is one component of a comprehensive MRSA colonization surveillance program. It is not intended to diagnose MRSA infection nor to guide or monitor treatment for MRSA infections.      Labs: Basic Metabolic Panel:  Recent Labs Lab 01/14/16 1240 01/18/16 1546 01/18/16 1554 01/20/16 0556  NA 137 129* 127* 129*  K 4.0 3.9 3.9 3.9  CL 102 94* 90* 97*  CO2 26 26  --  22  GLUCOSE 115* 158* 158* 98  BUN 16 16 16 12   CREATININE 0.98 0.91 0.90 0.73  CALCIUM 8.9 8.8*  --  8.1*   Liver Function Tests:  Recent Labs Lab 01/14/16 1240  AST 26  ALT 19  ALKPHOS 33*  BILITOT 1.3*  PROT 5.8*  ALBUMIN 3.7   No results for input(s): LIPASE, AMYLASE in the last 168 hours. No results  for input(s): AMMONIA in the last 168 hours. CBC:  Recent Labs Lab 01/14/16 1240 01/18/16 1546 01/18/16 1554 01/19/16 0039 01/19/16 0913 01/19/16 2025 01/20/16 0556  WBC 9.4 8.3  --   --  8.0 6.5 6.9  NEUTROABS 7.7 6.1  --   --   --   --   --   HGB 12.2 6.5* 6.8* 7.7* 8.1* 8.0* 8.2*  HCT 37.4 18.7* 20.0* 21.9* 23.2* 23.5* 23.9*  MCV 94.4 94.4  --   --  89.9 91.1 90.9  PLT 169 133*  --   --  133* 145* 162   Cardiac Enzymes: No results for input(s): CKTOTAL, CKMB, CKMBINDEX, TROPONINI in the last 168 hours. BNP: BNP (last 3 results) No results for input(s): BNP in the last 8760 hours.  ProBNP (last 3 results) No results for input(s): PROBNP in the last 8760 hours.  CBG:  Recent Labs Lab 01/16/16 0939  GLUCAP 141*       Signed:  Charlynne Cousins MD.  [facility]  01/21/2016, 10:36 AM

## 2016-01-21 NOTE — Progress Notes (Signed)
Pt transferred to: [facility] SN[facility] Anticipated date of transfer: 01/21/16 Transported by: Ambulance ([facility]) Time Tentatively Scheduled for: 2:15 PM Family notified : Richardine Service  Daughter Report # provided to nursing to call for report. DC summary sent to facility.  Patient is alert and oriented to person only. Daughter stated she was pleased with return to facility.  CSW spoke with admissions at [facility] and bed is available.  No further CSW needs identified.    CSW signing off.  Kendell Bane, LCSW [phone]

## 2016-01-21 NOTE — Progress Notes (Signed)
RN called report to [facility] 2 LPN at [facility]. No questions or concerns voiced when asked. Pt left unit on stretcher with [facility]. Family members accompanied pt. Dorita Fray 01/21/2016 2:43 PM

## 2016-01-21 NOTE — Progress Notes (Signed)
Pt transferred to: [facility] SN[facility] Anticipated date of transfer:  01/21/16 Transported by: Ambulance ([facility]) Time Tentatively Scheduled for: 2:30 PM Family notified: Daughter- Richardine Service Report # given to nurse to call report. DC summary sent to facility. Patient and daughter are agreeable to d/c and are pleased to return to [facility].  Notified [facility]- Admissions at [facility] of d/c;  Bed is available.  No further CSW needs identified.    CSW signing off.  Kendell Bane, LCSW [phone]

## 2016-01-24 ENCOUNTER — Encounter (HOSPITAL_COMMUNITY): Payer: Self-pay | Admitting: *Deleted

## 2016-01-24 ENCOUNTER — Emergency Department (HOSPITAL_COMMUNITY)
Admission: EM | Admit: 2016-01-24 | Discharge: 2016-01-25 | Disposition: A | Discharge status: Home / Self Care | Payer: Medicare Other | Attending: Emergency Medicine | Admitting: Emergency Medicine

## 2016-01-24 DIAGNOSIS — Z79899 Other long term (current) drug therapy: Secondary | ICD-10-CM | POA: Diagnosis not present

## 2016-01-24 DIAGNOSIS — E871 Hypo-osmolality and hyponatremia: Secondary | ICD-10-CM | POA: Diagnosis not present

## 2016-01-24 DIAGNOSIS — E039 Hypothyroidism, unspecified: Secondary | ICD-10-CM | POA: Diagnosis not present

## 2016-01-24 DIAGNOSIS — I10 Essential (primary) hypertension: Secondary | ICD-10-CM | POA: Insufficient documentation

## 2016-01-24 DIAGNOSIS — K219 Gastro-esophageal reflux disease without esophagitis: Secondary | ICD-10-CM | POA: Insufficient documentation

## 2016-01-24 DIAGNOSIS — Z8673 Personal history of transient ischemic attack (TIA), and cerebral infarction without residual deficits: Secondary | ICD-10-CM | POA: Diagnosis not present

## 2016-01-24 DIAGNOSIS — R7989 Other specified abnormal findings of blood chemistry: Secondary | ICD-10-CM | POA: Diagnosis present

## 2016-01-24 DIAGNOSIS — Z7982 Long term (current) use of aspirin: Secondary | ICD-10-CM | POA: Insufficient documentation

## 2016-01-24 DIAGNOSIS — Z7902 Long term (current) use of antithrombotics/antiplatelets: Secondary | ICD-10-CM | POA: Diagnosis not present

## 2016-01-24 DIAGNOSIS — Z880 Allergy status to penicillin: Secondary | ICD-10-CM | POA: Diagnosis not present

## 2016-01-24 DIAGNOSIS — Z85828 Personal history of other malignant neoplasm of skin: Secondary | ICD-10-CM | POA: Diagnosis not present

## 2016-01-24 DIAGNOSIS — R000 Tachycardia, unspecified: Secondary | ICD-10-CM | POA: Diagnosis not present

## 2016-01-24 DIAGNOSIS — F419 Anxiety disorder, unspecified: Secondary | ICD-10-CM | POA: Insufficient documentation

## 2016-01-24 LAB — CBC WITH DIFFERENTIAL/PLATELET
BASOS ABS: 0 10*3/uL (ref 0.0–0.1)
BASOS PCT: 0 %
EOS ABS: 0.1 10*3/uL (ref 0.0–0.7)
EOS PCT: 1 %
HCT: 25.3 % — ABNORMAL LOW (ref 36.0–46.0)
HEMOGLOBIN: 8.6 g/dL — AB (ref 12.0–15.0)
LYMPHS ABS: 1.4 10*3/uL (ref 0.7–4.0)
Lymphocytes Relative: 17 %
MCH: 30.4 pg (ref 26.0–34.0)
MCHC: 34 g/dL (ref 30.0–36.0)
MCV: 89.4 fL (ref 78.0–100.0)
Monocytes Absolute: 1 10*3/uL (ref 0.1–1.0)
Monocytes Relative: 12 %
NEUTROS PCT: 70 %
Neutro Abs: 6 10*3/uL (ref 1.7–7.7)
PLATELETS: 296 10*3/uL (ref 150–400)
RBC: 2.83 MIL/uL — AB (ref 3.87–5.11)
RDW: 16.6 % — ABNORMAL HIGH (ref 11.5–15.5)
WBC: 8.5 10*3/uL (ref 4.0–10.5)

## 2016-01-24 LAB — COMPREHENSIVE METABOLIC PANEL
ALT: 32 U/L (ref 14–54)
ANION GAP: 10 (ref 5–15)
AST: 38 U/L (ref 15–41)
Albumin: 3 g/dL — ABNORMAL LOW (ref 3.5–5.0)
Alkaline Phosphatase: 70 U/L (ref 38–126)
BILIRUBIN TOTAL: 2.3 mg/dL — AB (ref 0.3–1.2)
BUN: 15 mg/dL (ref 6–20)
CHLORIDE: 93 mmol/L — AB (ref 101–111)
CO2: 21 mmol/L — ABNORMAL LOW (ref 22–32)
Calcium: 8.5 mg/dL — ABNORMAL LOW (ref 8.9–10.3)
Creatinine, Ser: 0.71 mg/dL (ref 0.44–1.00)
Glucose, Bld: 109 mg/dL — ABNORMAL HIGH (ref 65–99)
POTASSIUM: 4.4 mmol/L (ref 3.5–5.1)
Sodium: 124 mmol/L — ABNORMAL LOW (ref 135–145)
TOTAL PROTEIN: 5.1 g/dL — AB (ref 6.5–8.1)

## 2016-01-24 NOTE — ED Provider Notes (Signed)
CSN: [id number]     Arrival date & time 01/24/16  2051 History   First MD Initiated Contact with Patient 01/24/16 2059     Chief Complaint  Patient presents with  . Abnormal Lab     (Consider location/radiation/quality/duration/timing/severity/associated sxs/prior Treatment) The history is provided by the patient and a relative.   The patient is a 82 year old female who presents from her facility for concerns of hyponatremia. Patient originally was admitted 2 weeks ago after a fall with resulting pelvic fractures. Patient was recently admitted shortly afterwards after being found to have anemia with hemoglobin in the 6s. Patient was discharged on the fourth. Patient has been steadily improving at her facility with physical therapy but today had her sodium checked and reportedly 119. She is otherwise not had any fevers, shortness of breath, chest pain, changes in bowel or bladder and a report some mild pelvic pain. No seizures or changes in her baseline mental status. She has poor baseline po intact but is improving.  Past Medical History  Diagnosis Date  . Atrial fibrillation [facility])     Patient declines anticoagulation  . Hypothyroidism   . GERD (gastroesophageal reflux disease)   . Anxiety   . Aortic valve disorders   . Essential hypertension, benign   . History of TIA (transient ischemic attack)   . 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 Pulmonary nodule     Stable 4 mm left upper lobe, benign  . Carotid artery disease ([place])     Less than 50% bilaterally 2/13  . Skin cancer   . Rectal bleeding 05/14/2013   Past Surgical History  Procedure Laterality Date  . Abdominal hysterectomy    . Tonsillectomy    . Abdominal hysterectomy    . Skin cancer removed    . Tonsillectomy    . Cataract extraction bilateral w/ anterior vitrectomy    . Colonoscopy N/A 05/20/2013    Procedure: COLONOSCOPY;  Surgeon: Rogene Houston, MD;  [facility];   Service: Endoscopy;  Laterality: N/A;  145  . Left heart catheterization with coronary angiogram N/A 03/06/2013    Procedure: LEFT HEART CATHETERIZATION WITH CORONARY ANGIOGRAM;  Surgeon: Larey Dresser, MD;  Location: [facility] CATH LAB;  Service: Cardiovascular;  Laterality: N/A;   Family History  Problem Relation Age of Onset  . Diabetes Mellitus II Mother   . CAD Brother   . Breast cancer Sister    Social History  Substance Use Topics  . Smoking status: Never Smoker   . Smokeless tobacco: Never Used  . Alcohol Use: No   OB History    No data available     Review of Systems  Constitutional: Negative for fever and chills.  HENT: Negative.   Eyes: Negative for visual disturbance.  Respiratory: Negative for cough and shortness of breath.   Cardiovascular: Negative for chest pain and leg swelling.  Gastrointestinal: Negative for nausea, vomiting, abdominal pain and diarrhea.  Genitourinary: Negative.   Musculoskeletal: Negative.   Skin: Negative for pallor, rash and wound.  Neurological: Negative.       Allergies  Penicillins  Home Medications   Prior to Admission medications   Medication Sig Start Date End Date Taking? Authorizing Provider  aspirin EC 81 MG tablet Take 1 tablet (81 mg total) by mouth daily. 01/27/16  Yes Charlynne Cousins, MD  Calcium-Magnesium-Vitamin D (CALCIUM MAGNESIUM PO) Take 1 tab by mouth every morning & 2  tabs at bedtime   Yes Historical Provider, MD  clopidogrel (PLAVIX) 75 MG tablet Take 1 tablet (75 mg total) by mouth daily. 01/21/16  Yes Charlynne Cousins, MD  HYDROcodone-acetaminophen (NORCO/VICODIN) 5-325 MG tablet Take 1 tablet by mouth every 4 (four) hours as needed for moderate pain. 01/21/16  Yes Charlynne Cousins, MD  levothyroxine (SYNTHROID, LEVOTHROID) 50 MCG tablet Take 50 mcg by mouth daily before breakfast.   Yes Historical Provider, MD  Omega-3 Fatty Acids (FISH OIL) 1200 MG CAPS Take 2 capsules by mouth every morning.    Yes  Historical Provider, MD  pantoprazole (PROTONIX) 20 MG tablet Take 20 mg by mouth daily as needed for heartburn.   Yes Historical Provider, MD  traMADol (ULTRAM) 50 MG tablet Take 50 mg by mouth every 6 (six) hours as needed for moderate pain.   Yes Historical Provider, MD  acetaminophen (TYLENOL) 325 MG tablet Take 2 tablets (650 mg total) by mouth every 6 (six) hours as needed for mild pain (or Fever >/= 101). 01/17/16   Robbie Lis, MD  metoprolol tartrate (LOPRESSOR) 25 MG tablet Take 1 tablet (25 mg total) by mouth 2 (two) times daily. Patient not taking: Reported on 01/24/2016 01/21/16   Charlynne Cousins, MD  polyethylene glycol [facility] / Floria Raveling) packet Take 17 g by mouth 2 (two) times daily. Patient not taking: Reported on 01/24/2016 01/21/16   Charlynne Cousins, MD   BP 120/55 mmHg  Pulse 68  Temp(Src) 98.7 F (37.1 C) (Oral)  Resp 21  SpO2 92% Physical Exam  Constitutional: She is oriented to person, place, and time.  Frail chronically ill-appearing elderly female  HENT:  Head: Normocephalic and atraumatic.  Eyes: EOM are normal. Pupils are equal, round, and reactive to light.  Neck: Normal range of motion. Neck supple.  Cardiovascular: Intact distal pulses.  An irregularly irregular rhythm present. Tachycardia present.   Pulmonary/Chest: Effort normal. No respiratory distress. She has no wheezes. She has no rales. She exhibits no tenderness.  Abdominal: Soft. She exhibits no distension. There is no tenderness. There is no rebound and no guarding.  Musculoskeletal: Normal range of motion. She exhibits no edema or tenderness.  Neurological: She is alert and oriented to person, place, and time. No cranial nerve deficit. She exhibits normal muscle tone. Coordination normal.  Skin: Skin is warm and dry. No rash noted. No erythema. No pallor.    ED Course  Procedures (including critical care time) Labs Review Labs Reviewed  CBC WITH DIFFERENTIAL/PLATELET - Abnormal; Notable for  the following:    RBC 2.83 (*)    Hemoglobin 8.6 (*)    HCT 25.3 (*)    RDW 16.6 (*)    All other components within normal limits  COMPREHENSIVE METABOLIC PANEL - Abnormal; Notable for the following:    Sodium 124 (*)    Chloride 93 (*)    CO2 21 (*)    Glucose, Bld 109 (*)    Calcium 8.5 (*)    Total Protein 5.1 (*)    Albumin 3.0 (*)    Total Bilirubin 2.3 (*)    All other components within normal limits  URINALYSIS, ROUTINE W REFLEX MICROSCOPIC (NOT AT [facility]) - Abnormal; Notable for the following:    Specific Gravity, Urine 1.004 (*)    All other components within normal limits  BASIC METABOLIC PANEL - Abnormal; Notable for the following:    Sodium 130 (*)    Chloride 99 (*)    Glucose,  Bld 102 (*)    Calcium 8.1 (*)    All other components within normal limits    Imaging Review No results found. I have personally reviewed and evaluated these images and lab results as part of my medical decision-making.   EKG Interpretation None      MDM   Final diagnoses:  Hyponatremia    Pt is a 89yoF here from her facility for concern of asymptomatic hyponatremia in the setting of multiple recent hospitalizations for pelvic fractures. Otherwise no change in her baseline mental status. Further history and exam as above. VSS. Repeat na 124. Renal function adequate and hb trending up. Medicine consulted and rec ns bolus, increase po intake, and recheck bmp to evaluate for na trending up.  Patient care handed off at 1am. F/u bmp and discharge if trending up.   Heriberto Antigua, MD 01/25/16 [place], MD 01/26/16 [phone]

## 2016-01-24 NOTE — ED Notes (Signed)
Pt from [place] c/o low sodium. Pt with recent pelvic fractures and old bruising noted to pelvis and L arm. Pt denies complaints

## 2016-01-24 NOTE — ED Notes (Signed)
MD made aware of results

## 2016-01-24 NOTE — ED Notes (Signed)
Admitting MD at bedside.

## 2016-01-25 DIAGNOSIS — E871 Hypo-osmolality and hyponatremia: Secondary | ICD-10-CM | POA: Diagnosis not present

## 2016-01-25 LAB — BASIC METABOLIC PANEL
ANION GAP: 8 (ref 5–15)
BUN: 14 mg/dL (ref 6–20)
CO2: 23 mmol/L (ref 22–32)
Calcium: 8.1 mg/dL — ABNORMAL LOW (ref 8.9–10.3)
Chloride: 99 mmol/L — ABNORMAL LOW (ref 101–111)
Creatinine, Ser: 0.74 mg/dL (ref 0.44–1.00)
GFR calc Af Amer: >60 mL/min (ref >60)
Glucose, Bld: 102 mg/dL — ABNORMAL HIGH (ref 65–99)
POTASSIUM: 4 mmol/L (ref 3.5–5.1)
SODIUM: 130 mmol/L — AB (ref 135–145)

## 2016-01-25 LAB — URINALYSIS, ROUTINE W REFLEX MICROSCOPIC
Bilirubin Urine: NEGATIVE
Glucose, UA: NEGATIVE mg/dL
Hgb urine dipstick: NEGATIVE
Ketones, ur: NEGATIVE mg/dL
LEUKOCYTES UA: NEGATIVE
NITRITE: NEGATIVE
PROTEIN: NEGATIVE mg/dL
Specific Gravity, Urine: 1.004 — ABNORMAL LOW (ref 1.005–1.030)
pH: 6.5 (ref 5.0–8.0)

## 2016-01-25 MED ORDER — SODIUM CHLORIDE 0.9 % IV BOLUS (SEPSIS): 1000.0000 mL | Freq: Once | INTRAVENOUS | Status: DC

## 2016-01-25 MED ORDER — SODIUM CHLORIDE 0.9 % IV BOLUS (SEPSIS)
500.0000 mL | Freq: Once | INTRAVENOUS | Status: AC
  Administered 2016-01-25: 500 mL via INTRAVENOUS

## 2016-01-25 NOTE — ED Notes (Signed)
PTAR at bedside 

## 2016-01-25 NOTE — ED Notes (Signed)
Paged admitting MD concerning discharge; pt to be discharged by EDP

## 2016-01-25 NOTE — Consult Note (Signed)
[facility] Medical Consultation  TAMIIA LEBECK [id number] DOB: 1925/08/23 DOA: 01/24/2016 PCP: [name], MD   Requesting physician: EDP Date of consultation: 01/25/16 Reason for consultation: Hyponatremia  Impression/Recommendations Active Problems:   Hyponatremia    1. Hyponatremia - 1. Sodium improved to 130 with just 1x bolus of 500 cc NS IV in ED 2. Suspect that hyponatremia was due to poor PO intake of anything other than water (which patient's daughter supports on history taking).  Sounds like a primary polydipsia picture in relation to other PO intake. 3. Had work up including urine lytes that essentially ruled out SIADH as primary factor a mere 5 days ago as inpatient. 4. Patient completely asymptomatic from hyponatremia perspective before and during ED stay 5. Feel she can be safely discharged back to NH with sodium of 130 now (note that this was higher than sodium of 129 at time of discharge a couple of days ago). 6. Push PO intake of nutrition beyond just water 7. Repeat labs in the next couple of days at [facility] 8. Reviewed med list: patient does not appear to be on any meds that are known to be major causes of hyponatremia (no thiazide diuretics, etc)   Chief Complaint: Abnormal lab  HPI:  82 yo F recently admitted for pelvic fractures, readmitted for blood loss anemia associated with fractures.  During that admit patient had poor PO intake due to injury resulting in hyponatremia.  Hyponatremia was worked up including urine lyte studies, but no evidence of SIADH was found, hyponatremia improved from sodium of 127 on 2/1 to sodium of 129 on discharge 2/3.  Today routine labs were checked at [facility] and showed sodium of 119.  Sodium was 124 initially in ED  Patient is completely asymptomatic from hyponatremia perspective.  She and daughter admit she drinks "a lot" of water and that "that is about all she drinks".  Her PO intake other than water has been poor though  daughter reports.  Today she had a good lunch, but breakfast consisted of Oatmeal and water, and dinner was an applesauce and water.  Review of Systems:  12 systems reviewed and otherwise negative  Past Medical History  Diagnosis Date  . Atrial fibrillation [facility])     Patient declines anticoagulation  . Hypothyroidism   . GERD (gastroesophageal reflux disease)   . Anxiety   . Aortic valve disorders   . Essential hypertension, benign   . History of TIA (transient ischemic attack)   . 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 Pulmonary nodule     Stable 4 mm left upper lobe, benign  . Carotid artery disease ([place])     Less than 50% bilaterally 2/13  . Skin cancer   . Rectal bleeding 05/14/2013   Past Surgical History  Procedure Laterality Date  . Abdominal hysterectomy    . Tonsillectomy    . Abdominal hysterectomy    . Skin cancer removed    . Tonsillectomy    . Cataract extraction bilateral w/ anterior vitrectomy    . Colonoscopy N/A 05/20/2013    Procedure: COLONOSCOPY;  Surgeon: Rogene Houston, MD;  [facility];  Service: Endoscopy;  Laterality: N/A;  145  . Left heart catheterization with coronary angiogram N/A 03/06/2013    Procedure: LEFT HEART CATHETERIZATION WITH CORONARY ANGIOGRAM;  Surgeon: Larey Dresser, MD;  Location: [facility] CATH LAB;  Service: Cardiovascular;  Laterality: N/A;   Social History:  reports that she has never smoked. She has never used smokeless tobacco. She reports that she does not drink alcohol or use illicit drugs.  Allergies  Allergen Reactions  . Penicillins Other (See Comments)    Has patient had a PCN reaction causing immediate rash, facial/tongue/throat swelling, SOB or lightheadedness with hypotension: unknown Has patient had a PCN reaction causing severe rash involving mucus membranes or skin necrosis: unknown Has patient had a PCN reaction that required hospitalization unknown Has patient had a  PCN reaction occurring within the last 10 years: no If all of the above answers are "NO", then may proceed with Cephalosporin use.    Family History  Problem Relation Age of Onset  . Diabetes Mellitus II Mother   . CAD Brother   . Breast cancer Sister     Prior to Admission medications   Medication Sig Start Date End Date Taking? Authorizing Provider  aspirin EC 81 MG tablet Take 1 tablet (81 mg total) by mouth daily. 01/27/16  Yes Charlynne Cousins, MD  Calcium-Magnesium-Vitamin D (CALCIUM MAGNESIUM PO) Take 1 tab by mouth every morning & 2 tabs at bedtime   Yes Historical Provider, MD  clopidogrel (PLAVIX) 75 MG tablet Take 1 tablet (75 mg total) by mouth daily. 01/21/16  Yes Charlynne Cousins, MD  HYDROcodone-acetaminophen (NORCO/VICODIN) 5-325 MG tablet Take 1 tablet by mouth every 4 (four) hours as needed for moderate pain. 01/21/16  Yes Charlynne Cousins, MD  levothyroxine (SYNTHROID, LEVOTHROID) 50 MCG tablet Take 50 mcg by mouth daily before breakfast.   Yes Historical Provider, MD  Omega-3 Fatty Acids (FISH OIL) 1200 MG CAPS Take 2 capsules by mouth every morning.    Yes Historical Provider, MD  pantoprazole (PROTONIX) 20 MG tablet Take 20 mg by mouth daily as needed for heartburn.   Yes Historical Provider, MD  traMADol (ULTRAM) 50 MG tablet Take 50 mg by mouth every 6 (six) hours as needed for moderate pain.   Yes Historical Provider, MD  acetaminophen (TYLENOL) 325 MG tablet Take 2 tablets (650 mg total) by mouth every 6 (six) hours as needed for mild pain (or Fever >/= 101). 01/17/16   Robbie Lis, MD  metoprolol tartrate (LOPRESSOR) 25 MG tablet Take 1 tablet (25 mg total) by mouth 2 (two) times daily. Patient not taking: Reported on 01/24/2016 01/21/16   Charlynne Cousins, MD  polyethylene glycol [facility] / Floria Raveling) packet Take 17 g by mouth 2 (two) times daily. Patient not taking: Reported on 01/24/2016 01/21/16   Charlynne Cousins, MD   Physical Exam: Blood pressure  101/66, pulse 96, temperature 98.7 F (37.1 C), temperature source Oral, resp. rate 19, SpO2 96 %. Filed Vitals:   01/25/16 0200 01/25/16 0230  BP: 126/59 101/66  Pulse: 80 96  Temp:    Resp: 23 19    General:  NAD, resting comfortably in bed Eyes: PEERLA EOMI ENT: mucous membranes moist Neck: supple w/o JVD Cardiovascular: RRR w/o MRG Respiratory: CTA B Abdomen: soft, nt, nd, bs+ Skin: no rash nor lesion Musculoskeletal: MAE, full ROM all 4 extremities Psychiatric: normal tone and affect Neurologic: AAOx3, grossly non-focal  Labs on Admission:  Basic Metabolic Panel:  Recent Labs Lab 01/18/16 1546 01/18/16 1554 01/20/16 0556 01/24/16 2130 01/25/16 0208  NA 129* 127* 129* 124* 130*  K 3.9 3.9 3.9 4.4 4.0  CL 94* 90* 97* 93* 99*  CO2 26  --  22 21* 23  GLUCOSE 158* 158* 98 109* 102*  BUN 16 16 12 15 14   CREATININE 0.91 0.90 0.73 0.71 0.74  CALCIUM 8.8*  --  8.1* 8.5* 8.1*   Liver Function Tests:  Recent Labs Lab 01/24/16 2130  AST 38  ALT 32  ALKPHOS 70  BILITOT 2.3*  PROT 5.1*  ALBUMIN 3.0*   No results for input(s): LIPASE, AMYLASE in the last 168 hours. No results for input(s): AMMONIA in the last 168 hours. CBC:  Recent Labs Lab 01/18/16 1546  01/19/16 0039 01/19/16 0913 01/19/16 2025 01/20/16 0556 01/24/16 2130  WBC 8.3  --   --  8.0 6.5 6.9 8.5  NEUTROABS 6.1  --   --   --   --   --  6.0  HGB 6.5*  < > 7.7* 8.1* 8.0* 8.2* 8.6*  HCT 18.7*  < > 21.9* 23.2* 23.5* 23.9* 25.3*  MCV 94.4  --   --  89.9 91.1 90.9 89.4  PLT 133*  --   --  133* 145* 162 296  < > = values in this interval not displayed. Cardiac Enzymes: No results for input(s): CKTOTAL, CKMB, CKMBINDEX, TROPONINI in the last 168 hours. BNP: Invalid input(s): POCBNP CBG: No results for input(s): GLUCAP in the last 168 hours.  Radiological Exams on Admission: No results found.  EKG: Independently reviewed.  Time spent: 70 min  GARDNER, JARED M. [facility] Pager  [phone]  If 7PM-7AM, please contact night-coverage [email or web address] Password [facility] 01/25/2016, 2:39 AM

## 2016-01-25 NOTE — ED Notes (Signed)
PTAR contacted for discharge

## 2016-01-25 NOTE — Discharge Instructions (Signed)
Hyponatremia Jasmine Torres, you need to eat a well balanced meal with your water.  Your sodium level has come up to 130.  Please have this rechecked in 2 days and come back to the ED if it is less than 125.  See a primary care doctor in 2 days for close follow up.  If symptoms worsen, come back to the ED immediately. Thank you. Hyponatremia is when the amount of salt (sodium) in your blood is too low. When salt levels are low, your cells absorb extra water and they swell. The swelling happens throughout the body, but it mostly affects the brain.  HOME CARE  Take medicines only as told by your doctor. Many medicines can make this condition worse. Talk with your doctor about any medicines that you are currently taking.  Carefully follow a recommended diet as told by your doctor.  Carefully follow instructions from your doctor about fluid restrictions.  Keep all follow-up visits as told by your doctor. This is important.  Do not drink alcohol. GET HELP IF:  You feel sicker to your stomach (nauseous).  You feel more confused.  You feel more tired (fatigued).  Your headache gets worse.  You feel weaker.  Your symptoms go away and then they come back.  You have trouble following the diet instructions. GET HELP RIGHT AWAY IF:  You start to twitch and shake (have a seizure).  You pass out (faint).  You keep having watery poop (diarrhea).  You keep throwing up (vomiting).   This information is not intended to replace advice given to you by your health care provider. Make sure you discuss any questions you have with your health care provider.   Document Released: 08/15/2011 Document Revised: 04/19/2015 Document Reviewed: 11/29/2014 Elsevier Interactive Patient Education [employer].

## 2016-02-11 ENCOUNTER — Emergency Department (HOSPITAL_COMMUNITY): Payer: Medicare Other

## 2016-02-11 ENCOUNTER — Inpatient Hospital Stay (HOSPITAL_COMMUNITY)
Admission: EM | Admit: 2016-02-11 | Discharge: 2016-02-23 | DRG: 871 | Disposition: A | Discharge status: Hospice | Payer: Medicare Other | Attending: Internal Medicine | Admitting: Internal Medicine

## 2016-02-11 ENCOUNTER — Encounter (HOSPITAL_COMMUNITY): Payer: Self-pay | Admitting: Emergency Medicine

## 2016-02-11 DIAGNOSIS — I779 Disorder of arteries and arterioles, unspecified: Secondary | ICD-10-CM | POA: Insufficient documentation

## 2016-02-11 DIAGNOSIS — E876 Hypokalemia: Secondary | ICD-10-CM | POA: Diagnosis present

## 2016-02-11 DIAGNOSIS — Z85828 Personal history of other malignant neoplasm of skin: Secondary | ICD-10-CM | POA: Diagnosis not present

## 2016-02-11 DIAGNOSIS — R609 Edema, unspecified: Secondary | ICD-10-CM

## 2016-02-11 DIAGNOSIS — Z7982 Long term (current) use of aspirin: Secondary | ICD-10-CM | POA: Diagnosis not present

## 2016-02-11 DIAGNOSIS — G9341 Metabolic encephalopathy: Secondary | ICD-10-CM | POA: Diagnosis present

## 2016-02-11 DIAGNOSIS — Z7902 Long term (current) use of antithrombotics/antiplatelets: Secondary | ICD-10-CM

## 2016-02-11 DIAGNOSIS — F0390 Unspecified dementia without behavioral disturbance: Secondary | ICD-10-CM | POA: Diagnosis present

## 2016-02-11 DIAGNOSIS — L89621 Pressure ulcer of left heel, stage 1: Secondary | ICD-10-CM | POA: Diagnosis present

## 2016-02-11 DIAGNOSIS — K219 Gastro-esophageal reflux disease without esophagitis: Secondary | ICD-10-CM | POA: Diagnosis present

## 2016-02-11 DIAGNOSIS — I482 Chronic atrial fibrillation, unspecified: Secondary | ICD-10-CM | POA: Diagnosis present

## 2016-02-11 DIAGNOSIS — A419 Sepsis, unspecified organism: Secondary | ICD-10-CM | POA: Diagnosis not present

## 2016-02-11 DIAGNOSIS — Z8673 Personal history of transient ischemic attack (TIA), and cerebral infarction without residual deficits: Secondary | ICD-10-CM | POA: Diagnosis not present

## 2016-02-11 DIAGNOSIS — S32501D Unspecified fracture of right pubis, subsequent encounter for fracture with routine healing: Secondary | ICD-10-CM | POA: Diagnosis not present

## 2016-02-11 DIAGNOSIS — L89152 Pressure ulcer of sacral region, stage 2: Secondary | ICD-10-CM | POA: Diagnosis present

## 2016-02-11 DIAGNOSIS — J811 Chronic pulmonary edema: Secondary | ICD-10-CM | POA: Diagnosis present

## 2016-02-11 DIAGNOSIS — Y92009 Unspecified place in unspecified non-institutional (private) residence as the place of occurrence of the external cause: Secondary | ICD-10-CM | POA: Diagnosis not present

## 2016-02-11 DIAGNOSIS — E039 Hypothyroidism, unspecified: Secondary | ICD-10-CM | POA: Diagnosis present

## 2016-02-11 DIAGNOSIS — J189 Pneumonia, unspecified organism: Secondary | ICD-10-CM | POA: Diagnosis not present

## 2016-02-11 DIAGNOSIS — Y95 Nosocomial condition: Secondary | ICD-10-CM | POA: Diagnosis present

## 2016-02-11 DIAGNOSIS — Z66 Do not resuscitate: Secondary | ICD-10-CM | POA: Diagnosis not present

## 2016-02-11 DIAGNOSIS — Z803 Family history of malignant neoplasm of breast: Secondary | ICD-10-CM | POA: Diagnosis not present

## 2016-02-11 DIAGNOSIS — Z79899 Other long term (current) drug therapy: Secondary | ICD-10-CM | POA: Diagnosis not present

## 2016-02-11 DIAGNOSIS — E871 Hypo-osmolality and hyponatremia: Secondary | ICD-10-CM | POA: Diagnosis present

## 2016-02-11 DIAGNOSIS — I10 Essential (primary) hypertension: Secondary | ICD-10-CM | POA: Diagnosis not present

## 2016-02-11 DIAGNOSIS — I359 Nonrheumatic aortic valve disorder, unspecified: Secondary | ICD-10-CM | POA: Diagnosis present

## 2016-02-11 DIAGNOSIS — Z8249 Family history of ischemic heart disease and other diseases of the circulatory system: Secondary | ICD-10-CM | POA: Diagnosis not present

## 2016-02-11 DIAGNOSIS — Z880 Allergy status to penicillin: Secondary | ICD-10-CM

## 2016-02-11 DIAGNOSIS — W19XXXA Unspecified fall, initial encounter: Secondary | ICD-10-CM | POA: Diagnosis present

## 2016-02-11 DIAGNOSIS — Z515 Encounter for palliative care: Secondary | ICD-10-CM | POA: Diagnosis not present

## 2016-02-11 DIAGNOSIS — R4182 Altered mental status, unspecified: Secondary | ICD-10-CM | POA: Diagnosis not present

## 2016-02-11 DIAGNOSIS — R509 Fever, unspecified: Secondary | ICD-10-CM | POA: Diagnosis not present

## 2016-02-11 DIAGNOSIS — L89612 Pressure ulcer of right heel, stage 2: Secondary | ICD-10-CM | POA: Diagnosis present

## 2016-02-11 DIAGNOSIS — R52 Pain, unspecified: Secondary | ICD-10-CM | POA: Diagnosis not present

## 2016-02-11 DIAGNOSIS — S32592A Other specified fracture of left pubis, initial encounter for closed fracture: Secondary | ICD-10-CM | POA: Diagnosis present

## 2016-02-11 DIAGNOSIS — Z833 Family history of diabetes mellitus: Secondary | ICD-10-CM

## 2016-02-11 DIAGNOSIS — F419 Anxiety disorder, unspecified: Secondary | ICD-10-CM | POA: Diagnosis present

## 2016-02-11 DIAGNOSIS — L8992 Pressure ulcer of unspecified site, stage 2: Secondary | ICD-10-CM | POA: Diagnosis present

## 2016-02-11 DIAGNOSIS — S32591A Other specified fracture of right pubis, initial encounter for closed fracture: Secondary | ICD-10-CM | POA: Diagnosis present

## 2016-02-11 DIAGNOSIS — Z9181 History of falling: Secondary | ICD-10-CM | POA: Diagnosis not present

## 2016-02-11 DIAGNOSIS — I739 Peripheral vascular disease, unspecified: Secondary | ICD-10-CM

## 2016-02-11 DIAGNOSIS — R0600 Dyspnea, unspecified: Secondary | ICD-10-CM | POA: Diagnosis not present

## 2016-02-11 DIAGNOSIS — R451 Restlessness and agitation: Secondary | ICD-10-CM | POA: Insufficient documentation

## 2016-02-11 DIAGNOSIS — I82409 Acute embolism and thrombosis of unspecified deep veins of unspecified lower extremity: Secondary | ICD-10-CM | POA: Diagnosis present

## 2016-02-11 DIAGNOSIS — J1000 Influenza due to other identified influenza virus with unspecified type of pneumonia: Secondary | ICD-10-CM | POA: Diagnosis present

## 2016-02-11 DIAGNOSIS — J101 Influenza due to other identified influenza virus with other respiratory manifestations: Secondary | ICD-10-CM | POA: Diagnosis not present

## 2016-02-11 DIAGNOSIS — G9340 Encephalopathy, unspecified: Secondary | ICD-10-CM | POA: Diagnosis not present

## 2016-02-11 LAB — URINALYSIS, ROUTINE W REFLEX MICROSCOPIC
BILIRUBIN URINE: NEGATIVE
GLUCOSE, UA: 250 mg/dL — AB
HGB URINE DIPSTICK: NEGATIVE
Ketones, ur: NEGATIVE mg/dL
Leukocytes, UA: NEGATIVE
Nitrite: NEGATIVE
PH: 7.5 (ref 5.0–8.0)
PROTEIN: 30 mg/dL — AB
SPECIFIC GRAVITY, URINE: 1.018 (ref 1.005–1.030)

## 2016-02-11 LAB — COMPREHENSIVE METABOLIC PANEL
ALBUMIN: 3.2 g/dL — AB (ref 3.5–5.0)
ALK PHOS: 181 U/L — AB (ref 38–126)
ALT: 54 U/L (ref 14–54)
ANION GAP: 9 (ref 5–15)
AST: 64 U/L — AB (ref 15–41)
BUN: 17 mg/dL (ref 6–20)
CO2: 23 mmol/L (ref 22–32)
Calcium: 8.2 mg/dL — ABNORMAL LOW (ref 8.9–10.3)
Chloride: 100 mmol/L — ABNORMAL LOW (ref 101–111)
Creatinine, Ser: 0.62 mg/dL (ref 0.44–1.00)
GFR calc Af Amer: >60 mL/min (ref >60)
GFR calc non Af Amer: >60 mL/min (ref >60)
GLUCOSE: 118 mg/dL — AB (ref 65–99)
POTASSIUM: 3.4 mmol/L — AB (ref 3.5–5.1)
SODIUM: 132 mmol/L — AB (ref 135–145)
Total Bilirubin: 1.3 mg/dL — ABNORMAL HIGH (ref 0.3–1.2)
Total Protein: 5.8 g/dL — ABNORMAL LOW (ref 6.5–8.1)

## 2016-02-11 LAB — CBC WITH DIFFERENTIAL/PLATELET
BASOS PCT: 0 %
Basophils Absolute: 0 10*3/uL (ref 0.0–0.1)
Eosinophils Absolute: 0 10*3/uL (ref 0.0–0.7)
Eosinophils Relative: 0 %
HEMATOCRIT: 33 % — AB (ref 36.0–46.0)
HEMOGLOBIN: 10.7 g/dL — AB (ref 12.0–15.0)
LYMPHS ABS: 0.9 10*3/uL (ref 0.7–4.0)
LYMPHS PCT: 9 %
MCH: 31.5 pg (ref 26.0–34.0)
MCHC: 32.4 g/dL (ref 30.0–36.0)
MCV: 97.1 fL (ref 78.0–100.0)
MONOS PCT: 8 %
Monocytes Absolute: 0.8 10*3/uL (ref 0.1–1.0)
NEUTROS ABS: 8.4 10*3/uL — AB (ref 1.7–7.7)
Neutrophils Relative %: 83 %
Platelets: 230 10*3/uL (ref 150–400)
RBC: 3.4 MIL/uL — ABNORMAL LOW (ref 3.87–5.11)
RDW: 17 % — ABNORMAL HIGH (ref 11.5–15.5)
WBC: 10.1 10*3/uL (ref 4.0–10.5)

## 2016-02-11 LAB — URINE MICROSCOPIC-ADD ON
RBC / HPF: NONE SEEN RBC/hpf (ref 0–5)
Squamous Epithelial / LPF: NONE SEEN
WBC UA: NONE SEEN WBC/hpf (ref 0–5)

## 2016-02-11 LAB — BRAIN NATRIURETIC PEPTIDE: B Natriuretic Peptide: 396.7 pg/mL — ABNORMAL HIGH (ref 0.0–100.0)

## 2016-02-11 LAB — PROCALCITONIN: Procalcitonin: 0.28 ng/mL

## 2016-02-11 LAB — LACTIC ACID, PLASMA: LACTIC ACID, VENOUS: 0.8 mmol/L (ref 0.5–2.0)

## 2016-02-11 LAB — PROTIME-INR
INR: 1.46 (ref 0.00–1.49)
Prothrombin Time: 17.3 seconds — ABNORMAL HIGH (ref 11.6–15.2)

## 2016-02-11 LAB — APTT: APTT: 33 s (ref 24–37)

## 2016-02-11 LAB — I-STAT CG4 LACTIC ACID, ED: Lactic Acid, Venous: 1.08 mmol/L (ref 0.5–2.0)

## 2016-02-11 LAB — TROPONIN I: TROPONIN I: 0.04 ng/mL — AB (ref <0.031)

## 2016-02-11 MED ORDER — SODIUM CHLORIDE 0.9 % IV SOLN
INTRAVENOUS | Status: DC
Start: 2016-02-11 — End: 2016-02-12
  Administered 2016-02-11 – 2016-02-12 (×2): via INTRAVENOUS

## 2016-02-11 MED ORDER — PANTOPRAZOLE SODIUM 40 MG IV SOLR
40.0000 mg | INTRAVENOUS | Status: DC
  Administered 2016-02-11 – 2016-02-13 (×3): 40 mg via INTRAVENOUS
  Filled 2016-02-11 (×4): qty 40

## 2016-02-11 MED ORDER — DEXTROSE 5 % IV SOLN
2.0000 g | Freq: Once | INTRAVENOUS | Status: AC
  Administered 2016-02-11: 2 g via INTRAVENOUS
  Filled 2016-02-11: qty 2

## 2016-02-11 MED ORDER — POTASSIUM CHLORIDE 10 MEQ/100ML IV SOLN
10.0000 meq | INTRAVENOUS | Status: AC
  Administered 2016-02-11 (×2): 10 meq via INTRAVENOUS
  Filled 2016-02-11: qty 100

## 2016-02-11 MED ORDER — LEVALBUTEROL HCL 1.25 MG/0.5ML IN NEBU
1.2500 mg | INHALATION_SOLUTION | Freq: Three times a day (TID) | RESPIRATORY_TRACT | Status: DC
  Administered 2016-02-12 – 2016-02-15 (×10): 1.25 mg via RESPIRATORY_TRACT
  Filled 2016-02-11 (×14): qty 0.5

## 2016-02-11 MED ORDER — DEXTROSE 5 % IV SOLN
1.0000 g | Freq: Three times a day (TID) | INTRAVENOUS | Status: DC
  Administered 2016-02-12 – 2016-02-15 (×10): 1 g via INTRAVENOUS
  Filled 2016-02-11 (×12): qty 1

## 2016-02-11 MED ORDER — ENOXAPARIN SODIUM 40 MG/0.4ML ~~LOC~~ SOLN
40.0000 mg | Freq: Every day | SUBCUTANEOUS | Status: DC
  Administered 2016-02-11 – 2016-02-22 (×12): 40 mg via SUBCUTANEOUS
  Filled 2016-02-11 (×12): qty 0.4

## 2016-02-11 MED ORDER — LEVALBUTEROL HCL 1.25 MG/0.5ML IN NEBU
1.2500 mg | INHALATION_SOLUTION | Freq: Four times a day (QID) | RESPIRATORY_TRACT | Status: DC
  Administered 2016-02-11: 1.25 mg via RESPIRATORY_TRACT
  Filled 2016-02-11: qty 0.5

## 2016-02-11 MED ORDER — ASPIRIN 300 MG RE SUPP
150.0000 mg | Freq: Every day | RECTAL | Status: DC
  Administered 2016-02-11: 150 mg via RECTAL
  Filled 2016-02-11 (×2): qty 1

## 2016-02-11 MED ORDER — VANCOMYCIN HCL IN DEXTROSE 1-5 GM/200ML-% IV SOLN
1000.0000 mg | Freq: Once | INTRAVENOUS | Status: AC
  Administered 2016-02-11: 1000 mg via INTRAVENOUS
  Filled 2016-02-11 (×2): qty 200

## 2016-02-11 MED ORDER — VANCOMYCIN HCL IN DEXTROSE 1-5 GM/200ML-% IV SOLN
1000.0000 mg | INTRAVENOUS | Status: DC
  Administered 2016-02-12 – 2016-02-14 (×3): 1000 mg via INTRAVENOUS
  Filled 2016-02-11 (×3): qty 200

## 2016-02-11 MED ORDER — LEVOTHYROXINE SODIUM 100 MCG IV SOLR
25.0000 ug | Freq: Every day | INTRAVENOUS | Status: DC
  Administered 2016-02-13 – 2016-02-14 (×2): 25 ug via INTRAVENOUS
  Filled 2016-02-11 (×3): qty 5

## 2016-02-11 MED ORDER — SODIUM CHLORIDE 0.9 % IV BOLUS (SEPSIS)
1000.0000 mL | Freq: Once | INTRAVENOUS | Status: AC
  Administered 2016-02-11: 1000 mL via INTRAVENOUS

## 2016-02-11 NOTE — Progress Notes (Signed)
Pharmacy Antibiotic Note  Jasmine Torres is a 82 y.o. female presented to the ED from [facility] on 02/11/2016 with c/o fever and AMS.  To start vancomycin and aztreonam for PNA.  - 2/25 CXR: Diffuse interstitial changes which may represents a a degree of bronchitis or congestive failure - Tmax 101.2, wbc wnl, scr 0.62 (crcl~36-46)   Plan: - vancomycin 1gm IV q24h - aztreonam 2gm IVx1, then 1gm IV q8h    Temp (24hrs), Avg:101.2 F (38.4 C), Min:101.2 F (38.4 C), Max:101.2 F (38.4 C)   Recent Labs Lab 02/11/16 1840 02/11/16 1851  WBC 10.1  --   CREATININE 0.62  --   LATICACIDVEN  --  1.08    CrCl cannot be calculated (Unknown ideal weight.).    Allergies  Allergen Reactions  . Penicillins Other (See Comments)    Has patient had a PCN reaction causing immediate rash, facial/tongue/throat swelling, SOB or lightheadedness with hypotension: unknown Has patient had a PCN reaction causing severe rash involving mucus membranes or skin necrosis: unknown Has patient had a PCN reaction that required hospitalization unknown Has patient had a PCN reaction occurring within the last 10 years: no If all of the above answers are "NO", then may proceed with Cephalosporin use.    Antimicrobials this admission: 2/25 vanc>> 2/25 aztreo>>  Levels/dose changes this admission: n/a  Microbiology results: 2/25 ucx: 2/25 bcx x2:    Thank you for allowing pharmacy to be a part of this patient's care.  Lynelle Doctor 02/11/2016 8:33 PM

## 2016-02-11 NOTE — H&P (Signed)
[facility] History and Physical  MCKINNEY GLASS [id number] DOB: December 26, 1924 DOA: 02/11/2016  Referring physician: ED physician PCP: [name], MD  Specialists:   Chief Complaint: Fever, cough, shortness of breath, altered mental status,  HPI: Jasmine Torres is a 82 y.o. female with PMH of hypertension, GERD, hypothyroidism, anxiety, atrial fibrillation not on anticoagulants due to for any GI bleeding, aortic valve disease, TIA, pulmonary nodule use, paraspinal mass, skin cancer, GI bleeding, carotid artery stenosis, who presents with fever, cough, shortness breath, altered mental status.  Per family, patient has been sleeping a lot in the past 2 days, she is very drowsy and confused. She has dry cough and shortness of breath. She does not seem to have chest pain per family. No runny nose or sore throat. She has whole body aching. She had 1 loose stool bowel movement in the morning, but no abdominal pain, nausea, vomiting. Currently no diarrhea. Patient had pelvic fracture on 01/14/16 and did not have surgery. She still has some pain over bilateral hips per family.  In ED, patient was found to have WBC 10.1, negative urinalysis, likely to 1.08, temperature 101.2, tachycardia, tachypnea, oxygen desaturation to 88% on room air, potassium 3.4, renal function okay, chest x-ray showed diffuse interstitial change.  EKG:Not done in ED, will get one.   Where does patient live SNF Can patient participate in ADLs?  None  Review of Systems: Could not be reviewed due to altered mental status.  Allergy:  Allergies  Allergen Reactions  . Penicillins Other (See Comments)    Has patient had a PCN reaction causing immediate rash, facial/tongue/throat swelling, SOB or lightheadedness with hypotension: unknown Has patient had a PCN reaction causing severe rash involving mucus membranes or skin necrosis: unknown Has patient had a PCN reaction that required hospitalization unknown Has patient had a  PCN reaction occurring within the last 10 years: no If all of the above answers are "NO", then may proceed with Cephalosporin use.     Past Medical History  Diagnosis Date  . Atrial fibrillation [facility])     Patient declines anticoagulation  . Hypothyroidism   . GERD (gastroesophageal reflux disease)   . Anxiety   . Aortic valve disorders   . Essential hypertension, benign   . History of TIA (transient ischemic attack)   . 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 Pulmonary nodule     Stable 4 mm left upper lobe, benign  . Carotid artery disease ([place])     Less than 50% bilaterally 2/13  . Skin cancer   . Rectal bleeding 05/14/2013    Past Surgical History  Procedure Laterality Date  . Abdominal hysterectomy    . Tonsillectomy    . Abdominal hysterectomy    . Skin cancer removed    . Tonsillectomy    . Cataract extraction bilateral w/ anterior vitrectomy    . Colonoscopy N/A 05/20/2013    Procedure: COLONOSCOPY;  Surgeon: Rogene Houston, MD;  [facility];  Service: Endoscopy;  Laterality: N/A;  145  . Left heart catheterization with coronary angiogram N/A 03/06/2013    Procedure: LEFT HEART CATHETERIZATION WITH CORONARY ANGIOGRAM;  Surgeon: Larey Dresser, MD;  Location: [facility] CATH LAB;  Service: Cardiovascular;  Laterality: N/A;    Social History:  reports that she has never smoked. She has never used smokeless tobacco. She reports that she does not drink alcohol or use illicit drugs.  Family History:  Family History  Problem Relation Age of Onset  . Diabetes Mellitus II Mother   . CAD Brother   . Breast cancer Sister      Prior to Admission medications   Medication Sig Start Date End Date Taking? Authorizing Provider  acetaminophen (TYLENOL) 325 MG tablet Take 2 tablets (650 mg total) by mouth every 6 (six) hours as needed for mild pain (or Fever >/= 101). 01/17/16  Yes Robbie Lis, MD  aspirin EC 81 MG tablet Take 1 tablet  (81 mg total) by mouth daily. 01/27/16  Yes Charlynne Cousins, MD  Calcium-Magnesium-Vitamin D (CALCIUM MAGNESIUM PO) Take 1 tab by mouth every morning & 2 tabs at bedtime   Yes Historical Provider, MD  clopidogrel (PLAVIX) 75 MG tablet Take 1 tablet (75 mg total) by mouth daily. 01/21/16  Yes Charlynne Cousins, MD  levothyroxine (SYNTHROID, LEVOTHROID) 50 MCG tablet Take 50 mcg by mouth daily before breakfast.   Yes Historical Provider, MD  Omega-3 Fatty Acids (FISH OIL) 1200 MG CAPS Take 2 capsules by mouth every morning.    Yes Historical Provider, MD  pantoprazole (PROTONIX) 20 MG tablet Take 20 mg by mouth daily as needed for heartburn.   Yes Historical Provider, MD  traMADol (ULTRAM) 50 MG tablet Take 50 mg by mouth every 6 (six) hours as needed for moderate pain.   Yes Historical Provider, MD  HYDROcodone-acetaminophen (NORCO/VICODIN) 5-325 MG tablet Take 1 tablet by mouth every 4 (four) hours as needed for moderate pain. Patient not taking: Reported on 02/11/2016 01/21/16   Charlynne Cousins, MD  metoprolol tartrate (LOPRESSOR) 25 MG tablet Take 1 tablet (25 mg total) by mouth 2 (two) times daily. Patient not taking: Reported on 01/24/2016 01/21/16   Charlynne Cousins, MD  polyethylene glycol [facility] / Floria Raveling) packet Take 17 g by mouth 2 (two) times daily. Patient not taking: Reported on 01/24/2016 01/21/16   Charlynne Cousins, MD    Physical Exam: Filed Vitals:   02/11/16 1856 02/11/16 2202 02/11/16 2203 02/11/16 2312  BP: 131/66 141/75    Pulse: 101 90    Temp: 101.2 F (38.4 C) 99.7 F (37.6 C)    TempSrc: Rectal Oral    Resp: 22 21    Height:   5\' 5"  (1.651 m)   SpO2: 96% 94%  97%   General: Not in acute distress HEENT:       Eyes: PERRL, EOMI, no scleral icterus.       ENT: No discharge from the ears and nose, no pharynx injection, no tonsillar enlargement.        Neck: No JVD, no bruit, no mass felt. Heme: No neck lymph node enlargement. Cardiac: S1/S2, irregularly  irregular rhythm, No murmurs, No gallops or rubs. Pulm: Has crackles and rhonchi bilaterally.  Abd: Soft, nondistended, no organomegaly, BS present. Ext: No pitting leg edema bilaterally. 2+DP/PT pulse bilaterally. Musculoskeletal: No joint deformities, No joint redness or warmth, no limitation of ROM in spin. Skin: No rashes.  Neuro: not oriented X3, cranial nerves II-XII grossly intact. Psych: Patient is not psychotic, no suicidal or hemocidal ideation.  Labs on Admission:  Basic Metabolic Panel:  Recent Labs Lab 02/11/16 1840 02/12/16 0145  NA 132*  --   K 3.4*  --   CL 100*  --   CO2 23  --   GLUCOSE 118*  --   BUN 17  --   CREATININE 0.62  --   CALCIUM 8.2*  --  MG  --  2.2   Liver Function Tests:  Recent Labs Lab 02/11/16 1840  AST 64*  ALT 54  ALKPHOS 181*  BILITOT 1.3*  PROT 5.8*  ALBUMIN 3.2*   No results for input(s): LIPASE, AMYLASE in the last 168 hours. No results for input(s): AMMONIA in the last 168 hours. CBC:  Recent Labs Lab 02/11/16 1840  WBC 10.1  NEUTROABS 8.4*  HGB 10.7*  HCT 33.0*  MCV 97.1  PLT 230   Cardiac Enzymes:  Recent Labs Lab 02/11/16 2034  TROPONINI 0.04*    BNP (last 3 results)  Recent Labs  02/11/16 2034  BNP 396.7*    ProBNP (last 3 results) No results for input(s): PROBNP in the last 8760 hours.  CBG: No results for input(s): GLUCAP in the last 168 hours.  Radiological Exams on Admission: Dg Chest Port 1 View  02/11/2016  CLINICAL DATA:  Fevers and shortness of Breath EXAM: PORTABLE CHEST 1 VIEW COMPARISON:  01/14/2016 FINDINGS: Cardiac shadow is stable. Diffuse interstitial changes are noted bilaterally without frank pulmonary edema. No focal confluent infiltrate is seen. No bony abnormality is noted. IMPRESSION: Diffuse interstitial changes which may represents a a degree of bronchitis or congestive failure. No frank edema is seen. Electronically Signed   By: Inez Catalina M.D.   On: 02/11/2016 18:55     Assessment/Plan Principal Problem:   HCAP (healthcare-associated pneumonia) Active Problems:   Essential hypertension, benign   Hypothyroidism   Chronic atrial fibrillation (HCC)   Hyponatremia   GERD (gastroesophageal reflux disease)   History of TIA (transient ischemic attack)   Carotid artery disease (HCC)   Fever   Sepsis (HCC)   Hypokalemia   Acute encephalopathy   Possible HCAP and sepsis: Patient is a productive cough, fever, shortness of breath, crackles on auscultation, clinically consistent with possible HCAP vs. bronchitis. Patient's septic on admission with tachycardia, tachypnea, fever. Lactate is normal. Hemodynamically stable.  - Will admit to Telemetry Bed - IV Vancomycin and Aztreonam.  - IV Rocephin and azithromycin - Xopenex Neb prn for SOB - Urine legionella and S. pneumococcal antigen - Follow up blood culture x2, sputum culture and respiratory virus panel, plus Flu pcr - will get Procalcitonin and trend lactic acid level per sepsis protocol - IVF: 1L of NS bolus in ED, followed by 75 mL per hour of NS  - hold all oral meds  Acute encephalopathy: most likely due to sepsis secondary to possible pneumonia. Patient has history of atrial fibrillation not on anticoagulants. She is at high risk of getting stroke. Family refused MRI of brain. -Observe closely -Frequent neuro check -Treat infection as above  Atrial Fibrillation: CHA2DS2-VASc Score is 7, needs oral anticoagulation, but patient is on AC at home due to high risk of fall and GI bleeding. Heart rate is controlled. -continue metoprolol, switch to IV prn  Essential hypertension, benign: -IV hydralazine when necessary  GERD: -Protonix IV  History of TIA (transient ischemic attack): -ASA per rectal   DVT ppx:  SQ Lovenox  Code Status: Full code Family Communication:  Yes, patient's daughter, son-in-law  at bed side Disposition Plan: Admit to inpatient   Date of Service 02/12/2016     Ivor Costa [facility] Pager [phone]  If 7PM-7AM, please contact night-coverage [email or web address] Password [email or web address] 02/12/2016, 5:02 AM

## 2016-02-11 NOTE — ED Notes (Signed)
Per EMS Pt from [facility] in [place]. Per Staff, pt was sleeping all day long and checked her Temperature at 4pm and found a temp of 101. Pt hasn't had anything to treat fever. Pt altered per facility. Pt mumbling, lethargic. Pt weak.

## 2016-02-11 NOTE — ED Provider Notes (Signed)
CSN: [id number]     Arrival date & time 02/11/16  1802 History   First MD Initiated Contact with Patient 02/11/16 1811     Chief Complaint  Patient presents with  . Fever     (Consider location/radiation/quality/duration/timing/severity/associated sxs/prior Treatment) HPI Comments: Patient here with decreased level of consciousness and temperature of 101. She is noted to have been more lethargic and weak and mumbling. No reported history of cough or congestion. No history of recent UTI. EMS was called and patient transported here. No treatment given prior to arrival.  Patient is a 82 y.o. female presenting with fever. The history is provided by the patient, the EMS personnel and the nursing home. The history is limited by the condition of the patient.  Fever   Past Medical History  Diagnosis Date  . Atrial fibrillation [facility])     Patient declines anticoagulation  . Hypothyroidism   . GERD (gastroesophageal reflux disease)   . Anxiety   . Aortic valve disorders   . Essential hypertension, benign   . History of TIA (transient ischemic attack)   . 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 Pulmonary nodule     Stable 4 mm left upper lobe, benign  . Carotid artery disease ([place])     Less than 50% bilaterally 2/13  . Skin cancer   . Rectal bleeding 05/14/2013   Past Surgical History  Procedure Laterality Date  . Abdominal hysterectomy    . Tonsillectomy    . Abdominal hysterectomy    . Skin cancer removed    . Tonsillectomy    . Cataract extraction bilateral w/ anterior vitrectomy    . Colonoscopy N/A 05/20/2013    Procedure: COLONOSCOPY;  Surgeon: Rogene Houston, MD;  [facility];  Service: Endoscopy;  Laterality: N/A;  145  . Left heart catheterization with coronary angiogram N/A 03/06/2013    Procedure: LEFT HEART CATHETERIZATION WITH CORONARY ANGIOGRAM;  Surgeon: Larey Dresser, MD;  Location: [facility] CATH LAB;  Service: Cardiovascular;   Laterality: N/A;   Family History  Problem Relation Age of Onset  . Diabetes Mellitus II Mother   . CAD Brother   . Breast cancer Sister    Social History  Substance Use Topics  . Smoking status: Never Smoker   . Smokeless tobacco: Never Used  . Alcohol Use: No   OB History    No data available     Review of Systems  Unable to perform ROS: Acuity of condition  Constitutional: Positive for fever.      Allergies  Penicillins  Home Medications   Prior to Admission medications   Medication Sig Start Date End Date Taking? Authorizing Provider  acetaminophen (TYLENOL) 325 MG tablet Take 2 tablets (650 mg total) by mouth every 6 (six) hours as needed for mild pain (or Fever >/= 101). 01/17/16  Yes Robbie Lis, MD  aspirin EC 81 MG tablet Take 1 tablet (81 mg total) by mouth daily. 01/27/16  Yes Charlynne Cousins, MD  Calcium-Magnesium-Vitamin D (CALCIUM MAGNESIUM PO) Take 1 tab by mouth every morning & 2 tabs at bedtime   Yes Historical Provider, MD  clopidogrel (PLAVIX) 75 MG tablet Take 1 tablet (75 mg total) by mouth daily. 01/21/16  Yes Charlynne Cousins, MD  levothyroxine (SYNTHROID, LEVOTHROID) 50 MCG tablet Take 50 mcg by mouth daily before breakfast.   Yes Historical Provider, MD  Omega-3 Fatty Acids (FISH OIL) 1200  MG CAPS Take 2 capsules by mouth every morning.    Yes Historical Provider, MD  pantoprazole (PROTONIX) 20 MG tablet Take 20 mg by mouth daily as needed for heartburn.   Yes Historical Provider, MD  traMADol (ULTRAM) 50 MG tablet Take 50 mg by mouth every 6 (six) hours as needed for moderate pain.   Yes Historical Provider, MD  HYDROcodone-acetaminophen (NORCO/VICODIN) 5-325 MG tablet Take 1 tablet by mouth every 4 (four) hours as needed for moderate pain. Patient not taking: Reported on 02/11/2016 01/21/16   Charlynne Cousins, MD  metoprolol tartrate (LOPRESSOR) 25 MG tablet Take 1 tablet (25 mg total) by mouth 2 (two) times daily. Patient not taking:  Reported on 01/24/2016 01/21/16   Charlynne Cousins, MD  polyethylene glycol [facility] / Flor[facility] Raveling) packet Take 17 g by mouth 2 (two) times daily. Patient not taking: Reported on 01/24/2016 01/21/16   Charlynne Cousins, MD   BP 131/66 mmHg  Pulse 83  Temp(Src) 101.2 F (38.4 C) (Rectal)  Resp 18  SpO2 96% Physical Exam  Constitutional: She appears lethargic. She appears cachectic. She appears toxic. She appears distressed.  HENT:  Head: Normocephalic and atraumatic.  Eyes: Conjunctivae, EOM and lids are normal. Pupils are equal, round, and reactive to light.  Neck: Normal range of motion. Neck supple. No tracheal deviation present. No thyroid mass present.  Cardiovascular: Normal rate, regular rhythm and normal heart sounds.  Exam reveals no gallop.   No murmur heard. Pulmonary/Chest: Effort normal. No stridor. No respiratory distress. She has decreased breath sounds. She has wheezes. She has no rhonchi. She has no rales.  Abdominal: Soft. Normal appearance and bowel sounds are normal. She exhibits no distension. There is no tenderness. There is no rebound and no CVA tenderness.  Musculoskeletal: Normal range of motion. She exhibits no edema or tenderness.  Neurological: She appears lethargic. No cranial nerve deficit. GCS eye subscore is 4. GCS verbal subscore is 4. GCS motor subscore is 5.  Skin: Skin is warm and dry. No abrasion and no rash noted.  Psychiatric: Her affect is blunt. She is noncommunicative.  Nursing note and vitals reviewed.   ED Course  Procedures (including critical care time) Labs Review Labs Reviewed  CULTURE, BLOOD (ROUTINE X 2)  CULTURE, BLOOD (ROUTINE X 2)  URINE CULTURE  COMPREHENSIVE METABOLIC PANEL  CBC WITH DIFFERENTIAL/PLATELET  URINALYSIS, ROUTINE W REFLEX MICROSCOPIC (NOT AT [facility])  I-STAT CG4 LACTIC ACID, ED    Imaging Review No results found. I have personally reviewed and evaluated these images and lab results as part of my medical  decision-making.   EKG Interpretation None      MDM   Final diagnoses:  None    Pt with likely HCAP and abx started, will be admitted to tele    Lacretia Leigh, MD 02/11/16 2048

## 2016-02-12 DIAGNOSIS — R509 Fever, unspecified: Secondary | ICD-10-CM

## 2016-02-12 DIAGNOSIS — G9340 Encephalopathy, unspecified: Secondary | ICD-10-CM | POA: Insufficient documentation

## 2016-02-12 LAB — LACTIC ACID, PLASMA: LACTIC ACID, VENOUS: 0.8 mmol/L (ref 0.5–2.0)

## 2016-02-12 LAB — INFLUENZA PANEL BY PCR (TYPE A & B)
H1N1FLUPCR: NOT DETECTED
INFLAPCR: NEGATIVE
INFLBPCR: NEGATIVE

## 2016-02-12 LAB — MRSA PCR SCREENING: MRSA by PCR: NEGATIVE

## 2016-02-12 LAB — MAGNESIUM: MAGNESIUM: 2.2 mg/dL (ref 1.7–2.4)

## 2016-02-12 MED ORDER — METOPROLOL TARTRATE 1 MG/ML IV SOLN
2.5000 mg | INTRAVENOUS | Status: DC | PRN
  Administered 2016-02-13: 2.5 mg via INTRAVENOUS
  Filled 2016-02-12 (×2): qty 5

## 2016-02-12 MED ORDER — OSELTAMIVIR PHOSPHATE 30 MG PO CAPS
30.0000 mg | ORAL_CAPSULE | Freq: Two times a day (BID) | ORAL | Status: AC
  Administered 2016-02-12 – 2016-02-16 (×10): 30 mg via ORAL
  Filled 2016-02-12 (×11): qty 1

## 2016-02-12 MED ORDER — ACETAMINOPHEN 325 MG PO TABS
650.0000 mg | ORAL_TABLET | Freq: Four times a day (QID) | ORAL | Status: DC
  Administered 2016-02-12 – 2016-02-13 (×5): 650 mg via ORAL
  Filled 2016-02-12 (×7): qty 2

## 2016-02-12 MED ORDER — FUROSEMIDE 10 MG/ML IJ SOLN
20.0000 mg | Freq: Once | INTRAMUSCULAR | Status: AC
  Administered 2016-02-12: 20 mg via INTRAVENOUS
  Filled 2016-02-12: qty 2

## 2016-02-12 MED ORDER — HYDRALAZINE HCL 20 MG/ML IJ SOLN
5.0000 mg | INTRAMUSCULAR | Status: DC | PRN
  Administered 2016-02-19: 5 mg via INTRAVENOUS
  Filled 2016-02-12: qty 1

## 2016-02-12 MED ORDER — POTASSIUM CHLORIDE 10 MEQ/100ML IV SOLN
10.0000 meq | INTRAVENOUS | Status: AC
  Administered 2016-02-12 (×2): 10 meq via INTRAVENOUS
  Filled 2016-02-12 (×2): qty 100

## 2016-02-12 NOTE — Progress Notes (Signed)
OT Cancellation Note  Patient Details Name: Jasmine Torres MRN: [id number] DOB: 1925-08-12   Cancelled Treatment:    Reason Eval/Treat Not Completed: Fatigue/lethargy limiting ability to participate  Fatigue/lethargy limiting ability to participate (Dr. Eliseo Squires reports to start OT tomorrow)   Betsy Pries 02/12/2016, 10:49 AM

## 2016-02-12 NOTE — Progress Notes (Signed)
PT Cancellation Note  Patient Details Name: Jasmine Torres MRN: [id number] DOB: [birth year]   Cancelled Treatment:    Reason Eval/Treat Not Completed: Fatigue/lethargy limiting ability to participate (Dr. Eliseo Squires reports to start PT tomorrow)   Trena Platt 02/12/2016, 10:37 AM Carmelia Bake, PT, DPT 02/12/2016 Pager: [phone]

## 2016-02-12 NOTE — Consult Note (Signed)
WOC wound consult note Reason for Consult:Asked to clarify Stage of Pressure injuries to bilateral Heels.  Intact, fluid-filled blister to right heel = Stage 2.  Non-blanching erythema to left heel = Stage 1 Wound type:Pressure Pressure Ulcer POA: Yes WTA  P. Jeannie Done has put other treatment interventions in place, specifically,  floatation, moisturizing dressing. [place] nursing team will not follow, but will remain available to this patient, the nursing and medical teams.  Please re-consult if needed. Thanks, Maudie Flakes, MSN, RN, [place], Arther Abbott  Pager# [phone] :

## 2016-02-12 NOTE — Progress Notes (Signed)
PROGRESS NOTE  Jasmine Torres [id number] DOB: 10-15-1925 DOA: 02/11/2016 PCP: [name], MD  Assessment/Plan: sepsis due to Possible HCAP vs flu  -productive cough, fever, shortness of breath, crackles on auscultation, clinically consistent with possible HCAP vs. bronchitis. Patient's septic on admission with tachycardia, tachypnea, fever. - IV Vancomycin and Aztreonam.  - Xopenex Neb prn for SOB - Urine legionella and S. pneumococcal antigen - Follow up blood culture x2, sputum culture and respiratory virus panel, plus Flu pcr - will get Procalcitonin and trend lactic acid level per sepsis protocol  ?pulm edema -echo ordered -lasix given x1 -assess daily for improvement -BNP elevated  Acute encephalopathy:  Family says she has not been the same since being seen in the ER for hyponatremia -MRI tomm?-- will discuss again with family -Observe closely -Treat infection as above -tylenol only to treat pain  Atrial Fibrillation: CHA2DS2-VASc Score is 7 -oral anticoagulation held until 3/4 per EMR  -continue metoprolol, switch to IV prn  Essential hypertension, benign: -IV hydralazine when necessary  GERD: -Protonix IV  Code Status: full Family Communication: daughter at bedside Disposition Plan:    Consultants:    Procedures:      HPI/Subjective: Will awaken and mumble a few words   Objective: Filed Vitals:   02/11/16 2202 02/12/16 0557  BP: 141/75 139/65  Pulse: 90 91  Temp: 99.7 F (37.6 C) 98.6 F (37 C)  Resp: 21 20    Intake/Output Summary (Last 24 hours) at 02/12/16 1316 Last data filed at 02/12/16 1100  Gross per 24 hour  Intake  612.5 ml  Output    400 ml  Net  212.5 ml   Filed Weights   02/11/16 2203  Weight: 57.607 kg (127 lb)    Exam:   General:  Ill appearing  Cardiovascular: rrr  Respiratory: coarse, no wheezing  Abdomen: +BS, soft  Musculoskeletal: no edema   Data Reviewed: Basic Metabolic Panel:  Recent  Labs Lab 02/11/16 1840 02/12/16 0145  NA 132*  --   K 3.4*  --   CL 100*  --   CO2 23  --   GLUCOSE 118*  --   BUN 17  --   CREATININE 0.62  --   CALCIUM 8.2*  --   MG  --  2.2   Liver Function Tests:  Recent Labs Lab 02/11/16 1840  AST 64*  ALT 54  ALKPHOS 181*  BILITOT 1.3*  PROT 5.8*  ALBUMIN 3.2*   No results for input(s): LIPASE, AMYLASE in the last 168 hours. No results for input(s): AMMONIA in the last 168 hours. CBC:  Recent Labs Lab 02/11/16 1840  WBC 10.1  NEUTROABS 8.4*  HGB 10.7*  HCT 33.0*  MCV 97.1  PLT 230   Cardiac Enzymes:  Recent Labs Lab 02/11/16 2034  TROPONINI 0.04*   BNP (last 3 results)  Recent Labs  02/11/16 2034  BNP 396.7*    ProBNP (last 3 results) No results for input(s): PROBNP in the last 8760 hours.  CBG: No results for input(s): GLUCAP in the last 168 hours.  Recent Results (from the past 240 hour(s))  MRSA PCR Screening     Status: None   Collection Time: 02/11/16 10:23 PM  Result Value Ref Range Status   MRSA by PCR NEGATIVE NEGATIVE Final    Comment:        The GeneXpert MRSA Assay (FDA approved for NASAL specimens only), is one component of a comprehensive MRSA colonization surveillance program. It is not  intended to diagnose MRSA infection nor to guide or monitor treatment for MRSA infections.      Studies: Dg Chest Port 1 View  02/11/2016  CLINICAL DATA:  Fevers and shortness of Breath EXAM: PORTABLE CHEST 1 VIEW COMPARISON:  01/14/2016 FINDINGS: Cardiac shadow is stable. Diffuse interstitial changes are noted bilaterally without frank pulmonary edema. No focal confluent infiltrate is seen. No bony abnormality is noted. IMPRESSION: Diffuse interstitial changes which may represents a a degree of bronchitis or congestive failure. No frank edema is seen. Electronically Signed   By: Inez Catalina M.D.   On: 02/11/2016 18:55    Scheduled Meds: . acetaminophen  650 mg Oral Q6H  . aztreonam  1 g  Intravenous Q8H  . enoxaparin (LOVENOX) injection  40 mg Subcutaneous QHS  . levalbuterol  1.25 mg Nebulization TID  . levothyroxine  25 mcg Intravenous QAC breakfast  . oseltamivir  30 mg Oral BID  . pantoprazole (PROTONIX) IV  40 mg Intravenous Q24H  . vancomycin  1,000 mg Intravenous Q24H   Continuous Infusions:  Antibiotics Given (last 72 hours)    Date/Time Action Medication Dose Rate   02/11/16 2339 Given   vancomycin (VANCOCIN) IVPB 1000 mg/200 mL premix 1,000 mg 200 mL/hr   02/12/16 0548 Given   aztreonam (AZACTAM) 1 g in dextrose 5 % 50 mL IVPB 1 g 100 mL/hr   02/12/16 1221 Given   oseltamivir (TAMIFLU) capsule 30 mg 30 mg    02/12/16 1222 Given   aztreonam (AZACTAM) 1 g in dextrose 5 % 50 mL IVPB 1 g 100 mL/hr      Principal Problem:   HCAP (healthcare-associated pneumonia) Active Problems:   Essential hypertension, benign   Hypothyroidism   Chronic atrial fibrillation (HCC)   Hyponatremia   GERD (gastroesophageal reflux disease)   History of TIA (transient ischemic attack)   Carotid artery disease ([place])   Fever   Sepsis ([place])   Hypokalemia   Acute encephalopathy    Time spent: 25 min    [facility] Pager [phone]. If 7PM-7AM, please contact night-coverage at [email or web address], password [facility] 02/12/2016, 1:16 PM  LOS: 1 day

## 2016-02-12 NOTE — Progress Notes (Signed)
Utilization review completed.  

## 2016-02-13 ENCOUNTER — Ambulatory Visit (HOSPITAL_COMMUNITY): Payer: Medicare Other

## 2016-02-13 ENCOUNTER — Inpatient Hospital Stay (HOSPITAL_COMMUNITY): Payer: Medicare Other

## 2016-02-13 DIAGNOSIS — R0600 Dyspnea, unspecified: Secondary | ICD-10-CM

## 2016-02-13 DIAGNOSIS — L8992 Pressure ulcer of unspecified site, stage 2: Secondary | ICD-10-CM | POA: Diagnosis present

## 2016-02-13 DIAGNOSIS — R609 Edema, unspecified: Secondary | ICD-10-CM

## 2016-02-13 LAB — BASIC METABOLIC PANEL
ANION GAP: 10 (ref 5–15)
BUN: 10 mg/dL (ref 6–20)
CALCIUM: 7.6 mg/dL — AB (ref 8.9–10.3)
CHLORIDE: 100 mmol/L — AB (ref 101–111)
CO2: 22 mmol/L (ref 22–32)
CREATININE: 0.48 mg/dL (ref 0.44–1.00)
GFR calc non Af Amer: >60 mL/min (ref >60)
Glucose, Bld: 112 mg/dL — ABNORMAL HIGH (ref 65–99)
Potassium: 3.2 mmol/L — ABNORMAL LOW (ref 3.5–5.1)
SODIUM: 132 mmol/L — AB (ref 135–145)

## 2016-02-13 LAB — CBC
HCT: 32.2 % — ABNORMAL LOW (ref 36.0–46.0)
Hemoglobin: 10.1 g/dL — ABNORMAL LOW (ref 12.0–15.0)
MCH: 31 pg (ref 26.0–34.0)
MCHC: 31.4 g/dL (ref 30.0–36.0)
MCV: 98.8 fL (ref 78.0–100.0)
Platelets: 251 10*3/uL (ref 150–400)
RBC: 3.26 MIL/uL — ABNORMAL LOW (ref 3.87–5.11)
RDW: 17 % — ABNORMAL HIGH (ref 11.5–15.5)
WBC: 7 10*3/uL (ref 4.0–10.5)

## 2016-02-13 LAB — URINE CULTURE: CULTURE: NO GROWTH

## 2016-02-13 LAB — MAGNESIUM: Magnesium: 2 mg/dL (ref 1.7–2.4)

## 2016-02-13 MED ORDER — POTASSIUM CHLORIDE CRYS ER 20 MEQ PO TBCR
40.0000 meq | EXTENDED_RELEASE_TABLET | Freq: Once | ORAL | Status: AC
  Administered 2016-02-13: 40 meq via ORAL
  Filled 2016-02-13: qty 2

## 2016-02-13 MED ORDER — FUROSEMIDE 10 MG/ML IJ SOLN
20.0000 mg | Freq: Once | INTRAMUSCULAR | Status: AC
  Administered 2016-02-13: 20 mg via INTRAVENOUS
  Filled 2016-02-13: qty 2

## 2016-02-13 MED ORDER — IBUPROFEN 600 MG PO TABS
600.0000 mg | ORAL_TABLET | Freq: Once | ORAL | Status: AC
  Administered 2016-02-13: 600 mg via ORAL
  Filled 2016-02-13: qty 3
  Filled 2016-02-13: qty 1

## 2016-02-13 MED ORDER — LORAZEPAM 2 MG/ML IJ SOLN: 0.2500 mg | Freq: Once | INTRAMUSCULAR | Status: DC

## 2016-02-13 MED ORDER — CLONAZEPAM 0.5 MG PO TABS
0.2500 mg | ORAL_TABLET | Freq: Three times a day (TID) | ORAL | Status: DC | PRN
  Administered 2016-02-13 – 2016-02-16 (×5): 0.25 mg via ORAL
  Filled 2016-02-13 (×6): qty 1

## 2016-02-13 MED ORDER — ACETAMINOPHEN 325 MG PO TABS
650.0000 mg | ORAL_TABLET | ORAL | Status: DC
  Administered 2016-02-13 (×3): 650 mg via ORAL
  Filled 2016-02-13 (×9): qty 2

## 2016-02-13 MED ORDER — OXYCODONE HCL 5 MG PO TABS
2.5000 mg | ORAL_TABLET | Freq: Four times a day (QID) | ORAL | Status: DC | PRN
  Administered 2016-02-15: 2.5 mg via ORAL
  Filled 2016-02-13 (×2): qty 1

## 2016-02-13 MED ORDER — VITAMINS A & D EX OINT
TOPICAL_OINTMENT | CUTANEOUS | Status: AC
  Administered 2016-02-13: 5
  Filled 2016-02-13: qty 5

## 2016-02-13 NOTE — Progress Notes (Signed)
OT Cancellation Note  Patient Details Name: [name] MRN: [id number] DOB: 28-Mar-1925   Cancelled Treatment:    Spoke with PT as well as noted plan for SNF- will defer OT eval to SNF at this time. Thanks, Betsy Pries 02/13/2016, 12:58 PM

## 2016-02-13 NOTE — Evaluation (Signed)
Physical Therapy Evaluation Patient Details Name: Jasmine Torres MRN: [id number] DOB: 06-11-1925 Today's Date: 02/13/2016   History of Present Illness  82 y.o. female admitted from SNF with HCAP, ? flu, B heel pressure ulcers (stage 1 L, stage 2 R).    PMH of atrial fibrillation, hypertension, and TIA s/p fall down stairs 01/14/16 with pelvic fx.   Clinical Impression  Pt admitted with above diagnosis. Pt currently with functional limitations due to the deficits listed below (see PT Problem List). Attempted supine to sit with +2 assist, pt was unable to tolerate sitting due to pelvic pain so returned to supine. Per daughter, pt was ambulating short distances at SNF where she was getting stronger pain meds prior to admission. Due to encephalopathy she is now getting Tylenol for pain.  Pt will benefit from skilled PT to increase their independence and safety with mobility to allow discharge to the venue listed below.       Follow Up Recommendations SNF    Equipment Recommendations  Rolling walker with 5" wheels    Recommendations for Other Services       Precautions / Restrictions Precautions Precautions: Fall Restrictions Weight Bearing Restrictions: No Other Position/Activity Restrictions: WBAT per Dr. [name] (EDP consult in Dr. Quincy Sheehan notes)      Mobility  Bed Mobility Overal bed mobility: +2 for physical assistance;Needs Assistance Bed Mobility: Supine to Sit;Sit to Supine     Supine to sit: +2 for physical assistance;HOB elevated;Total assist Sit to supine: Total assist;+2 for physical assistance   General bed mobility comments: pt 20%, assist to raise trunk and advance BLEs, tolerance limited by pain, returned pt to supine with heels floating  Transfers                 General transfer comment: not tested, pt unable to tolerate mobility 2* pain  Ambulation/Gait                Stairs            Wheelchair Mobility    Modified Rankin (Stroke  Patients Only)       Balance Overall balance assessment: Needs assistance   Sitting balance-Leahy Scale: Poor Sitting balance - Comments: posterior lean, likely 2* pain Postural control: Posterior lean                                   Pertinent Vitals/Pain Faces Pain Scale: Hurts whole lot Pain Location: pelvis with supine to sit Pain Descriptors / Indicators: Crying Pain Intervention(s): Limited activity within patient's tolerance;Monitored during session;RN gave pain meds during session    Home Living Family/patient expects to be discharged to:: Skilled nursing facility Living Arrangements: Alone               Additional Comments: per dtr pt can go to her house and have 24/7 care if needed; pt normally lives alone and is quite active and independent    Prior Function Level of Independence: Independent         Comments: pta fall patient was independent with all adls. Since fall Jan 28 pt has walked short distances with RW at [facility].      Hand Dominance   Dominant Hand: Right    Extremity/Trunk Assessment   Upper Extremity Assessment: Defer to OT evaluation           Lower Extremity Assessment: RLE deficits/detail;LLE deficits/detail RLE Deficits / Details: grossly  3- to 3/5 due to pain limitations LLE Deficits / Details: grossly 3- to 3/5 due to pain limitations  Cervical / Trunk Assessment: Kyphotic  Communication   Communication: HOH  Cognition Arousal/Alertness: Awake/alert Behavior During Therapy: Anxious Overall Cognitive Status: Impaired/Different from baseline Area of Impairment: Memory                    General Comments      Exercises General Exercises - Lower Extremity Ankle Circles/Pumps: AAROM;Both;10 reps;Supine Heel Slides: AAROM;Both;10 reps;Supine Hip ABduction/ADduction: AAROM;Both;10 reps;Supine      Assessment/Plan    PT Assessment Patient needs continued PT services  PT Diagnosis Difficulty  walking;Generalized weakness;Acute pain   PT Problem List Decreased strength;Decreased activity tolerance;Decreased balance;Decreased mobility;Decreased knowledge of use of DME;Pain  PT Treatment Interventions DME instruction;Gait training;Functional mobility training;Therapeutic activities;Therapeutic exercise;Balance training;Patient/family education   PT Goals (Current goals can be found in the Care Plan section) Acute Rehab PT Goals Patient Stated Goal: to walk PT Goal Formulation: With family Time For Goal Achievement: 02/27/16 Potential to Achieve Goals: Good    Frequency Min 3X/week   Barriers to discharge        Co-evaluation               End of Session   Activity Tolerance: Patient limited by pain Patient left: with call bell/phone within reach;with family/visitor present;in bed;with bed alarm set Nurse Communication: Mobility status         Time: [id number] PT Time Calculation (min) (ACUTE ONLY): 25 min   Charges:   PT Evaluation $PT Eval Moderate Complexity: 1 Procedure PT Treatments $Therapeutic Activity: 8-22 mins   PT G Codes:        Philomena Doheny 02/13/2016, 10:45 AM [phone]

## 2016-02-13 NOTE — Progress Notes (Signed)
*  PRELIMINARY RESULTS* Vascular Ultrasound Lower extremity venous duplex has been completed.  Preliminary findings: DVT noted in the Right peroneal, Right soleal, and Left gastroc veins.   Landry Mellow, RDMS, RVT  02/13/2016, 1:44 PM

## 2016-02-13 NOTE — NC FL2 (Signed)
[place] LEVEL OF CARE SCREENING TOOL     IDENTIFICATION  Patient Name: Jasmine Torres Birthdate: [birth year] Sex: female Admission Date (Current Location): 02/11/2016  [facility] and [place] Number:  Engineer, manufacturing systems and Address:  [facility],  [place], [place]      Provider Number: [id number]  Attending Physician Name and Address:  Geradine Girt, DO  Relative Name and Phone Number:       Current Level of Care: Hospital Recommended Level of Care: [place] Prior Approval Number:    Date Approved/Denied:   PASRR Number: [id number]  Discharge Plan: SNF    Current Diagnoses: Patient Active Problem List   Diagnosis Date Noted  . Pressure ulcer 02/13/2016  . Acute encephalopathy 02/12/2016  . Fever 02/11/2016  . HCAP (healthcare-associated pneumonia) 02/11/2016  . Sepsis ([place]) 02/11/2016  . Hypokalemia 02/11/2016  . GERD (gastroesophageal reflux disease)   . History of TIA (transient ischemic attack)   . Carotid artery disease ([place])   . Pelvic hematoma, female 01/18/2016  . Acute blood loss anemia 01/18/2016  . Thrombocytopenia ([place]) 01/18/2016  . Hyponatremia 01/18/2016  . Bilateral pubic rami fractures ([place]) 01/14/2016  . Precordial pain 04/15/2014  . Chronic atrial fibrillation ([place]) 06/03/2013  . Hypothyroidism 03/26/2012  . Essential hypertension, benign 03/27/2010  . HYPERLIPIDEMIA-MIXED 10/03/2009    Orientation RESPIRATION BLADDER Height & Weight     Self  Normal Incontinent Weight: 127 lb (57.607 kg) Height:  5\' 5"  (165.1 cm)  BEHAVIORAL SYMPTOMS/MOOD NEUROLOGICAL BOWEL NUTRITION STATUS   (N/A)  (NONE) Continent Diet (DIET DYS 3 Room service appropriate; Fluid consistency: Thin )  AMBULATORY STATUS COMMUNICATION OF NEEDS Skin   Extensive Assist Verbally PU Stage and Appropriate Care  (StageI-Intactskinwithnon-blanchablerednessofalocalizedareausuallyoverabonyprominence.red,nonblanchableareaonheel) PU Stage 1 Dressing: No Dressing                     Personal Care Assistance Level of Assistance  Bathing, Feeding, Dressing Bathing Assistance: Maximum assistance Feeding assistance: Limited assistance Dressing Assistance: Maximum assistance     Functional Limitations Info  Sight, Hearing, Speech Sight Info: Adequate   Speech Info: Adequate    SPECIAL CARE FACTORS FREQUENCY  PT (By licensed PT), OT (By licensed OT)     PT Frequency: 5x week OT Frequency: 5x week            Contractures Contractures Info: Not present    Additional Factors Info  Code Status, Allergies, Isolation Precautions Code Status Info: FULL Allergies Info: Penicillins     Isolation Precautions Info: Droplet Precaution     Current Medications (02/13/2016):  This is the current hospital active medication list Current Facility-Administered Medications  Medication Dose Route Frequency Provider Last Rate Last Dose  . acetaminophen (TYLENOL) tablet 650 mg  650 mg Oral Q4H Jessica U Vann, DO   650 mg at 02/13/16 1530  . aztreonam (AZACTAM) 1 g in dextrose 5 % 50 mL IVPB  1 g Intravenous Q8H Anh P Pham, RPH   1 g at 02/13/16 1215  . clonazePAM (KLONOPIN) tablet 0.25 mg  0.25 mg Oral TID PRN Geradine Girt, DO      . enoxaparin (LOVENOX) injection 40 mg  40 mg Subcutaneous QHS Ivor Costa, MD   40 mg at 02/12/16 2101  . hydrALAZINE (APRESOLINE) injection 5 mg  5 mg Intravenous Q2H PRN Ivor Costa, MD      . levalbuterol [facility]) nebulizer solution 1.25 mg  1.25 mg Nebulization  TID Ivor Costa, MD   1.25 mg at 02/13/16 1357  . levothyroxine (SYNTHROID, LEVOTHROID) injection 25 mcg  25 mcg Intravenous QAC breakfast Ivor Costa, MD   25 mcg at 02/13/16 [phone]  . metoprolol (LOPRESSOR) injection 2.5 mg  2.5 mg Intravenous Q3H PRN Ivor Costa, MD   2.5 mg at 02/13/16 0451  .  oseltamivir (TAMIFLU) capsule 30 mg  30 mg Oral BID Geradine Girt, DO   30 mg at 02/13/16 1019  . oxyCODONE (Oxy IR/ROXICODONE) immediate release tablet 2.5 mg  2.5 mg Oral Q6H PRN Geradine Girt, DO      . pantoprazole (PROTONIX) injection 40 mg  40 mg Intravenous Q24H Ivor Costa, MD   40 mg at 02/12/16 2101  . vancomycin (VANCOCIN) IVPB 1000 mg/200 mL premix  1,000 mg Intravenous Q24H Anh P Pham, RPH   1,000 mg at 02/12/16 2300     Discharge Medications: Please see discharge summary for a list of discharge medications.  Relevant Imaging Results:  Relevant Lab Results:   Additional Information SSN: [id number]  Harlon Flor, Student-SW [phone]

## 2016-02-13 NOTE — Progress Notes (Signed)
PROGRESS NOTE  Jasmine Torres [id number] DOB: Oct 02, 1925 DOA: 02/11/2016 PCP: [name], MD  Assessment/Plan: sepsis due to Possible HCAP vs flu  - IV Vancomycin and Aztreonam.  - Xopenex Neb prn for SOB - Urine legionella and S. pneumococcal antigen - Follow up blood culture x2, sputum culture and respiratory virus panel, plus Flu pcr  DVT -recent bleeding -discussed with family- -all clots are below the knee- high risk of falls as well as bleeding-- may opt for surveillance and treatment with eliquis if worsening  ?pulm edema -echo shows good EF and  -lasix given x2 -assess daily for improvement -BNP elevated  Pain -ATC tylenol -oxy low dose for break through  Acute encephalopathy:  Family says she has not been the same since being seen in the ER for hyponatremia -Observe closely -Treat infection as above -tylenol only to treat pain  Atrial Fibrillation: CHA2DS2-VASc Score is 7 -oral anticoagulation held until 3/4 per EMR - was only on ASA/plavix previously -continue metoprolol, switch to IV prn  Essential hypertension, benign: -IV hydralazine when necessary  GERD: -Protonix IV  Hypokalemia -replete  Code Status: full Family Communication: daughter at bedside Disposition Plan:    Consultants:    Procedures:      HPI/Subjective: C/o pain down b/l legs  Objective: Filed Vitals:   02/13/16 1045 02/13/16 1345  BP: 137/76 134/62  Pulse: 100 87  Temp: 98 F (36.7 C) 98.2 F (36.8 C)  Resp: 24 19    Intake/Output Summary (Last 24 hours) at 02/13/16 1621 Last data filed at 02/13/16 1513  Gross per 24 hour  Intake    410 ml  Output    550 ml  Net   -140 ml   Filed Weights   02/11/16 2203  Weight: 57.607 kg (127 lb)    Exam:   General:  Ill appearing  Cardiovascular: rrr  Respiratory: coarse, no wheezing  Abdomen: +BS, soft  Musculoskeletal: no edema   Data Reviewed: Basic Metabolic Panel:  Recent Labs Lab  02/11/16 1840 02/12/16 0145 02/13/16 0517  NA 132*  --  132*  K 3.4*  --  3.2*  CL 100*  --  100*  CO2 23  --  22  GLUCOSE 118*  --  112*  BUN 17  --  10  CREATININE 0.62  --  0.48  CALCIUM 8.2*  --  7.6*  MG  --  2.2 2.0   Liver Function Tests:  Recent Labs Lab 02/11/16 1840  AST 64*  ALT 54  ALKPHOS 181*  BILITOT 1.3*  PROT 5.8*  ALBUMIN 3.2*   No results for input(s): LIPASE, AMYLASE in the last 168 hours. No results for input(s): AMMONIA in the last 168 hours. CBC:  Recent Labs Lab 02/11/16 1840 02/13/16 0517  WBC 10.1 7.0  NEUTROABS 8.4*  --   HGB 10.7* 10.1*  HCT 33.0* 32.2*  MCV 97.1 98.8  PLT 230 251   Cardiac Enzymes:  Recent Labs Lab 02/11/16 2034  TROPONINI 0.04*   BNP (last 3 results)  Recent Labs  02/11/16 2034  BNP 396.7*    ProBNP (last 3 results) No results for input(s): PROBNP in the last 8760 hours.  CBG: No results for input(s): GLUCAP in the last 168 hours.  Recent Results (from the past 240 hour(s))  Blood Culture (routine x 2)     Status: None (Preliminary result)   Collection Time: 02/11/16  6:40 PM  Result Value Ref Range Status   Specimen Description BLOOD RIGHT  ARM  Final   Special Requests BOTTLES DRAWN AEROBIC AND ANAEROBIC 5CC  Final   Culture   Final    NO GROWTH 1 DAY Performed at [facility]    Report Status PENDING  Incomplete  Blood Culture (routine x 2)     Status: None (Preliminary result)   Collection Time: 02/11/16  6:40 PM  Result Value Ref Range Status   Specimen Description RIGHT ANTECUBITAL  Final   Special Requests BOTTLES DRAWN AEROBIC AND ANAEROBIC 5CC  Final   Culture   Final    NO GROWTH 1 DAY Performed at [facility]    Report Status PENDING  Incomplete  Urine culture     Status: None   Collection Time: 02/11/16  7:52 PM  Result Value Ref Range Status   Specimen Description URINE, CATHETERIZED  Final   Special Requests NONE  Final   Culture   Final    NO GROWTH 1  DAY Performed at [facility]    Report Status 02/13/2016 FINAL  Final  MRSA PCR Screening     Status: None   Collection Time: 02/11/16 10:23 PM  Result Value Ref Range Status   MRSA by PCR NEGATIVE NEGATIVE Final    Comment:        The GeneXpert MRSA Assay (FDA approved for NASAL specimens only), is one component of a comprehensive MRSA colonization surveillance program. It is not intended to diagnose MRSA infection nor to guide or monitor treatment for MRSA infections.      Studies: Dg Lumbar Spine 2-3 Views  02/13/2016  CLINICAL DATA:  Low back pain, no known injury EXAM: LUMBAR SPINE - 2-3 VIEW COMPARISON:  01/18/2016 FINDINGS: Three views of lumbar spine submitted. No acute fracture or subluxation. Mild disc space flattening with anterior spurring noted at L2-L3 level. Minimal disc space flattening at L5-S1 level. Mild facet degenerative changes noted L5 level. The alignment and vertebral body heights are preserved. There is probable Schmorl's node deformity lower endplate of L1 vertebral body. IMPRESSION: No acute fracture or subluxation. Mild degenerative changes L2-L3 and L5-S1 level. Alignment and vertebral body heights are preserved. Electronically Signed   By: Lahoma Crocker M.D.   On: 02/13/2016 15:20   Dg Chest Port 1 View  02/11/2016  CLINICAL DATA:  Fevers and shortness of Breath EXAM: PORTABLE CHEST 1 VIEW COMPARISON:  01/14/2016 FINDINGS: Cardiac shadow is stable. Diffuse interstitial changes are noted bilaterally without frank pulmonary edema. No focal confluent infiltrate is seen. No bony abnormality is noted. IMPRESSION: Diffuse interstitial changes which may represents a a degree of bronchitis or congestive failure. No frank edema is seen. Electronically Signed   By: Inez Catalina M.D.   On: 02/11/2016 18:55    Scheduled Meds: . acetaminophen  650 mg Oral Q4H  . aztreonam  1 g Intravenous Q8H  . enoxaparin (LOVENOX) injection  40 mg Subcutaneous QHS  .  levalbuterol  1.25 mg Nebulization TID  . levothyroxine  25 mcg Intravenous QAC breakfast  . oseltamivir  30 mg Oral BID  . pantoprazole (PROTONIX) IV  40 mg Intravenous Q24H  . vancomycin  1,000 mg Intravenous Q24H   Continuous Infusions:  Antibiotics Given (last 72 hours)    Date/Time Action Medication Dose Rate   02/11/16 2339 Given   vancomycin (VANCOCIN) IVPB 1000 mg/200 mL premix 1,000 mg 200 mL/hr   02/12/16 0548 Given   aztreonam (AZACTAM) 1 g in dextrose 5 % 50 mL IVPB 1 g 100  mL/hr   02/12/16 1221 Given   oseltamivir (TAMIFLU) capsule 30 mg 30 mg    02/12/16 1222 Given   aztreonam (AZACTAM) 1 g in dextrose 5 % 50 mL IVPB 1 g 100 mL/hr   02/12/16 2101 Given   aztreonam (AZACTAM) 1 g in dextrose 5 % 50 mL IVPB 1 g 100 mL/hr   02/12/16 2101 Given   oseltamivir (TAMIFLU) capsule 30 mg 30 mg    02/12/16 2300 Given   vancomycin (VANCOCIN) IVPB 1000 mg/200 mL premix 1,000 mg 200 mL/hr   02/13/16 0445 Given   aztreonam (AZACTAM) 1 g in dextrose 5 % 50 mL IVPB 1 g 100 mL/hr   02/13/16 1019 Given   oseltamivir (TAMIFLU) capsule 30 mg 30 mg    02/13/16 1215 Given   aztreonam (AZACTAM) 1 g in dextrose 5 % 50 mL IVPB 1 g 100 mL/hr      Principal Problem:   HCAP (healthcare-associated pneumonia) Active Problems:   Essential hypertension, benign   Hypothyroidism   Chronic atrial fibrillation (HCC)   Hyponatremia   GERD (gastroesophageal reflux disease)   History of TIA (transient ischemic attack)   Carotid artery disease ([place])   Fever   Sepsis ([place])   Hypokalemia   Acute encephalopathy   Pressure ulcer    Time spent: 25 min    [place] [facility] Pager [phone]. If 7PM-7AM, please contact night-coverage at [email or web address], password [facility] 02/13/2016, 4:21 PM  LOS: 2 days

## 2016-02-13 NOTE — Progress Notes (Signed)
  Echocardiogram 2D Echocardiogram has been performed.  Tresa Res 02/13/2016, 10:56 AM

## 2016-02-13 NOTE — Clinical Social Work Note (Signed)
Clinical Social Work Assessment  Patient Details  Name: Jasmine Torres MRN: [id number] Date of Birth: 12-07-1925  Date of referral:  02/13/16               Reason for consult:  Facility Placement                Permission sought to share information with:    Permission granted to share information::     Name::        Agency::     Relationship::     Contact Information:     Housing/Transportation Living arrangements for the past 2 months:  [place], [place] of Information:  Adult Children Patient Interpreter Needed:  None Criminal Activity/Legal Involvement Pertinent to Current Situation/Hospitalization:  No - Comment as needed Significant Relationships:  Adult Children Lives with:  Facility Resident Do you feel safe going back to the place where you live?  Yes Need for family participation in patient care:  Yes (Comment)  Care giving concerns:  [facility] for rehab. PT recommending continued short-term rehab at [facility].   Social Worker assessment / plan:  CSW received consult for facility resident. BSW Intern met with pt daughter in hallway. BSW Intern introduced self and explained role. BSW Intern confirmed plans are to return to [place] at [place] when medically ready for dc.   Pt daughter concerned about insurance coverage after the 20 days of rehab with Medicare. BSW Intern explained pt's secondary insurance will go into effect on the day 21. Pt daughter inquired about what day the pt is on for rehab. BSW Intern contacted [facility] and left a voicemail.   BSW Intern to follow-up with pt daughter regarding insurance concerns.   CSW continuing to follow and provide disposition needs when appropriate.   Employment status:  Retired Forensic scientist:  Medicare PT Recommendations:  [place] / Referral to community resources:  [place]  Patient/Family's Response to  care:  Pt alert and oriented x1. Pt disoriented to place, time, and situation. Pt not in room at time of assessment. Pt daughter actively involved in pt care.  Patient/Family's Understanding of and Emotional Response to Diagnosis, Current Treatment, and Prognosis:  Pt daughter understanding of pt condition and treatment needed for care.   Emotional Assessment Appearance:  Appears stated age Attitude/Demeanor/Rapport:  Unable to Assess Affect (typically observed):  Unable to Assess Orientation:  Oriented to Self Alcohol / Substance use:  Not Applicable Psych involvement (Current and /or in the community):  No (Comment)  Discharge Needs  Concerns to be addressed:  No discharge needs identified, Care Coordination Readmission within the last 30 days:  Yes Current discharge risk:  None Barriers to Discharge:  Continued Medical Work up   [employer], Student-SW 02/13/2016, 3:24 PM

## 2016-02-14 LAB — CBC
HEMATOCRIT: 32.1 % — AB (ref 36.0–46.0)
HEMOGLOBIN: 10.4 g/dL — AB (ref 12.0–15.0)
MCH: 31.2 pg (ref 26.0–34.0)
MCHC: 32.4 g/dL (ref 30.0–36.0)
MCV: 96.4 fL (ref 78.0–100.0)
Platelets: 280 10*3/uL (ref 150–400)
RBC: 3.33 MIL/uL — ABNORMAL LOW (ref 3.87–5.11)
RDW: 16.4 % — ABNORMAL HIGH (ref 11.5–15.5)
WBC: 6.1 10*3/uL (ref 4.0–10.5)

## 2016-02-14 LAB — BASIC METABOLIC PANEL
Anion gap: 8 (ref 5–15)
BUN: 13 mg/dL (ref 6–20)
CHLORIDE: 102 mmol/L (ref 101–111)
CO2: 24 mmol/L (ref 22–32)
CREATININE: 0.35 mg/dL — AB (ref 0.44–1.00)
Calcium: 8.2 mg/dL — ABNORMAL LOW (ref 8.9–10.3)
GFR calc non Af Amer: >60 mL/min (ref >60)
Glucose, Bld: 113 mg/dL — ABNORMAL HIGH (ref 65–99)
Potassium: 3.8 mmol/L (ref 3.5–5.1)
Sodium: 134 mmol/L — ABNORMAL LOW (ref 135–145)

## 2016-02-14 LAB — RESPIRATORY VIRUS PANEL
Adenovirus: NEGATIVE
INFLUENZA A: NEGATIVE
Influenza B: POSITIVE — AB
Metapneumovirus: NEGATIVE
Parainfluenza 1: NEGATIVE
Parainfluenza 2: NEGATIVE
Parainfluenza 3: NEGATIVE
RESPIRATORY SYNCYTIAL VIRUS A: NEGATIVE
RESPIRATORY SYNCYTIAL VIRUS B: NEGATIVE
Rhinovirus: NEGATIVE

## 2016-02-14 LAB — MAGNESIUM: Magnesium: 2.2 mg/dL (ref 1.7–2.4)

## 2016-02-14 MED ORDER — PANTOPRAZOLE SODIUM 40 MG PO TBEC
40.0000 mg | DELAYED_RELEASE_TABLET | Freq: Every day | ORAL | Status: DC
  Administered 2016-02-14 – 2016-02-18 (×4): 40 mg via ORAL
  Filled 2016-02-14 (×12): qty 1

## 2016-02-14 MED ORDER — LEVOTHYROXINE SODIUM 50 MCG PO TABS
50.0000 ug | ORAL_TABLET | Freq: Every day | ORAL | Status: DC
  Administered 2016-02-15 – 2016-02-16 (×2): 50 ug via ORAL
  Filled 2016-02-14 (×4): qty 1

## 2016-02-14 MED ORDER — METOPROLOL TARTRATE 25 MG PO TABS
25.0000 mg | ORAL_TABLET | Freq: Two times a day (BID) | ORAL | Status: DC
  Administered 2016-02-14 – 2016-02-18 (×7): 25 mg via ORAL
  Filled 2016-02-14 (×10): qty 1

## 2016-02-14 MED ORDER — ACETAMINOPHEN 650 MG RE SUPP
650.0000 mg | RECTAL | Status: DC
  Administered 2016-02-14 – 2016-02-15 (×7): 650 mg via RECTAL
  Filled 2016-02-14 (×14): qty 1

## 2016-02-14 NOTE — Care Management Important Message (Signed)
Important Message  Patient Details Important Message  Patient Details IM Letter given to [name]/Case Manager to present to Patient Name: TAMU MALSAM MRN: [id number] Date of Birth: 1925/11/11   Medicare Important Message Given:  Yes    Camillo Flaming 02/14/2016, 12:45 PM Name: PHILADELPHIA RANSFORD MRN: [id number] Date of Birth: [birth year]   Medicare Important Message Given:  Yes    Camillo Flaming 02/14/2016, 12:45 PM

## 2016-02-14 NOTE — Progress Notes (Signed)
Pharmacy Antibiotic Note  Jasmine Torres is a 82 y.o. female admitted on 02/11/2016 with pneumonia.  Pharmacy has been consulted for aztreonam and vancomycin dosing.  Plan:  Continue Aztreonam 1g IV q8h  Continue Vancomycin 1g IV q24h.  Measure Vanc trough at steady state. (goal VT 15-20)  Follow up renal fxn, culture results, and clinical course.  Follow up antibiotic de-escalation.  Height: 5\' 5"  (165.1 cm) Weight: 127 lb (57.607 kg) IBW/kg (Calculated) : 57  Temp (24hrs), Avg:98.1 F (36.7 C), Min:98 F (36.7 C), Max:98.2 F (36.8 C)   Recent Labs Lab 02/11/16 1840 02/11/16 1851 02/11/16 2211 02/12/16 0139 02/13/16 0517 02/14/16 0434  WBC 10.1  --   --   --  7.0 6.1  CREATININE 0.62  --   --   --  0.48 0.35*  LATICACIDVEN  --  1.08 0.8 0.8  --   --     Estimated Creatinine Clearance: 43.7 mL/min (by C-G formula based on Cr of 0.35).    Allergies  Allergen Reactions  . Penicillins Other (See Comments)    Has patient had a PCN reaction causing immediate rash, facial/tongue/throat swelling, SOB or lightheadedness with hypotension: unknown Has patient had a PCN reaction causing severe rash involving mucus membranes or skin necrosis: unknown Has patient had a PCN reaction that required hospitalization unknown Has patient had a PCN reaction occurring within the last 10 years: no If all of the above answers are "NO", then may proceed with Cephalosporin use.     Antimicrobials this admission: 2/25 vanc>> 2/25 aztreo>> 2/26 tamiflu >> (3/3)  Dose adjustments this admission: 2/28 2200 Vanc trough = ____ on 1g q24h  Microbiology results: 2/25 ucx: NGF 2/25 bcx x2: ngtd 2/25 MRSA PCR: neg 2/25 Influenza PCR: negative 2/25 Resp virus panel: Influenza B +  Thank you for allowing pharmacy to be a part of this patient's care.  Gretta Arab PharmD, BCPS Pager [phone] 02/14/2016 10:16 AM

## 2016-02-14 NOTE — Progress Notes (Signed)
PROGRESS NOTE  Jasmine Torres [id number] DOB: 17-Dec-1925 DOA: 02/11/2016 PCP: [name], MD  Patient has had a complicated last 4 weeks.  Initially admitted to hospital after fall for non-operative hip fx.  Went to rehab only to return a few days later for low Hgb.  She then went back to rehab and returned for hyponatremia.  Patient now presents to the hospital with AMS, lethargy and SOB.  She was found to be flu B positive here.  There was also mention of a pneumonia which is being treated.  Patient was also found to have a distal DVT.  Recommendation during a previous admission was no anticoagulation (ASA/plavix) until at least 3/3    Assessment/Plan: HCAP and flu B -tamiflu x 5 days - IV Vancomycin and Aztreonam- change to PO in AM - Xopenex Neb prn for SOB - Urine legionella and S. pneumococcal antigen never collected - Follow up blood culture x2 NGTD  DVT -recent bleeding -discussed with family- -all clots are below the knee- high risk of falls as well as bleeding-- may opt for surveillance (duplex in 2 weeks and treatment with eliquis if worsening)  ?pulm edema -echo shows good EF -lasix given x2 -assess daily for improvement -BNP elevated  Pain -ATC tylenol -oxy low dose for break through  Acute encephalopathy:  Family says she has not been the same since being seen in the ER for hyponatremia -Observe closely -Treat infection as above -tylenol only to treat pain  Atrial Fibrillation: CHA2DS2-VASc Score is 7 -oral anticoagulation held until 3/4 per EMR - was only on ASA/plavix previously -continue metoprolol  Essential hypertension, benign: -IV hydralazine when necessary  GERD: -Protonix IV  Hypokalemia -replete  Code Status: full Family Communication: daughter at bedside Disposition Plan: SNF Thursday/friday   Consultants:    Procedures:      HPI/Subjective: Pain and SOB better  Objective: Filed Vitals:   02/14/16 0641 02/14/16 1210   BP: 113/77 133/65  Pulse: 109 105  Temp: 98 F (36.7 C) 97.8 F (36.6 C)  Resp: 22     Intake/Output Summary (Last 24 hours) at 02/14/16 1657 Last data filed at 02/14/16 0400  Gross per 24 hour  Intake    570 ml  Output    850 ml  Net   -280 ml   Filed Weights   02/11/16 2203  Weight: 57.607 kg (127 lb)    Exam:   General:  Elderly- hard of hearing  Cardiovascular: rrr  Respiratory: coarse, no wheezing  Abdomen: +BS, soft  Musculoskeletal: no edema   Data Reviewed: Basic Metabolic Panel:  Recent Labs Lab 02/11/16 1840 02/12/16 0145 02/13/16 0517 02/14/16 0434  NA 132*  --  132* 134*  K 3.4*  --  3.2* 3.8  CL 100*  --  100* 102  CO2 23  --  22 24  GLUCOSE 118*  --  112* 113*  BUN 17  --  10 13  CREATININE 0.62  --  0.48 0.35*  CALCIUM 8.2*  --  7.6* 8.2*  MG  --  2.2 2.0 2.2   Liver Function Tests:  Recent Labs Lab 02/11/16 1840  AST 64*  ALT 54  ALKPHOS 181*  BILITOT 1.3*  PROT 5.8*  ALBUMIN 3.2*   No results for input(s): LIPASE, AMYLASE in the last 168 hours. No results for input(s): AMMONIA in the last 168 hours. CBC:  Recent Labs Lab 02/11/16 1840 02/13/16 0517 02/14/16 0434  WBC 10.1 7.0 6.1  NEUTROABS 8.4*  --   --  HGB 10.7* 10.1* 10.4*  HCT 33.0* 32.2* 32.1*  MCV 97.1 98.8 96.4  PLT 230 251 280   Cardiac Enzymes:  Recent Labs Lab 02/11/16 2034  TROPONINI 0.04*   BNP (last 3 results)  Recent Labs  02/11/16 2034  BNP 396.7*    ProBNP (last 3 results) No results for input(s): PROBNP in the last 8760 hours.  CBG: No results for input(s): GLUCAP in the last 168 hours.  Recent Results (from the past 240 hour(s))  Blood Culture (routine x 2)     Status: None (Preliminary result)   Collection Time: 02/11/16  6:40 PM  Result Value Ref Range Status   Specimen Description BLOOD RIGHT ARM  Final   Special Requests BOTTLES DRAWN AEROBIC AND ANAEROBIC 5CC  Final   Culture   Final    NO GROWTH 2 DAYS Performed at  [facility]    Report Status PENDING  Incomplete  Blood Culture (routine x 2)     Status: None (Preliminary result)   Collection Time: 02/11/16  6:40 PM  Result Value Ref Range Status   Specimen Description RIGHT ANTECUBITAL  Final   Special Requests BOTTLES DRAWN AEROBIC AND ANAEROBIC 5CC  Final   Culture   Final    NO GROWTH 2 DAYS Performed at [facility]    Report Status PENDING  Incomplete  Urine culture     Status: None   Collection Time: 02/11/16  7:52 PM  Result Value Ref Range Status   Specimen Description URINE, CATHETERIZED  Final   Special Requests NONE  Final   Culture   Final    NO GROWTH 1 DAY Performed at [facility]    Report Status 02/13/2016 FINAL  Final  MRSA PCR Screening     Status: None   Collection Time: 02/11/16 10:23 PM  Result Value Ref Range Status   MRSA by PCR NEGATIVE NEGATIVE Final    Comment:        The GeneXpert MRSA Assay (FDA approved for NASAL specimens only), is one component of a comprehensive MRSA colonization surveillance program. It is not intended to diagnose MRSA infection nor to guide or monitor treatment for MRSA infections.   Respiratory virus panel     Status: Abnormal   Collection Time: 02/11/16 11:02 PM  Result Value Ref Range Status   Respiratory Syncytial Virus A Negative Negative Final   Respiratory Syncytial Virus B Negative Negative Final   Influenza A Negative Negative Final   Influenza B Positive (A) Negative Final   Parainfluenza 1 Negative Negative Final   Parainfluenza 2 Negative Negative Final   Parainfluenza 3 Negative Negative Final   Metapneumovirus Negative Negative Final   Rhinovirus Negative Negative Final   Adenovirus Negative Negative Final    Comment: (NOTE) Performed At: [facility] [address] [place], [place] [id number] Lindon Romp MD [id number]      Studies: Dg Lumbar Spine 2-3 Views  02/13/2016  CLINICAL DATA:  Low back pain, no known  injury EXAM: LUMBAR SPINE - 2-3 VIEW COMPARISON:  01/18/2016 FINDINGS: Three views of lumbar spine submitted. No acute fracture or subluxation. Mild disc space flattening with anterior spurring noted at L2-L3 level. Minimal disc space flattening at L5-S1 level. Mild facet degenerative changes noted L5 level. The alignment and vertebral body heights are preserved. There is probable Schmorl's node deformity lower endplate of L1 vertebral body. IMPRESSION: No acute fracture or subluxation. Mild degenerative changes L2-L3 and L5-S1 level. Alignment and  vertebral body heights are preserved. Electronically Signed   By: Lahoma Crocker M.D.   On: 02/13/2016 15:20    Scheduled Meds: . acetaminophen  650 mg Rectal Q4H  . aztreonam  1 g Intravenous Q8H  . enoxaparin (LOVENOX) injection  40 mg Subcutaneous QHS  . levalbuterol  1.25 mg Nebulization TID  . levothyroxine  25 mcg Intravenous QAC breakfast  . oseltamivir  30 mg Oral BID  . pantoprazole (PROTONIX) IV  40 mg Intravenous Q24H  . vancomycin  1,000 mg Intravenous Q24H   Continuous Infusions:  Antibiotics Given (last 72 hours)    Date/Time Action Medication Dose Rate   02/11/16 2339 Given   vancomycin (VANCOCIN) IVPB 1000 mg/200 mL premix 1,000 mg 200 mL/hr   02/12/16 0548 Given   aztreonam (AZACTAM) 1 g in dextrose 5 % 50 mL IVPB 1 g 100 mL/hr   02/12/16 1221 Given   oseltamivir (TAMIFLU) capsule 30 mg 30 mg    02/12/16 1222 Given   aztreonam (AZACTAM) 1 g in dextrose 5 % 50 mL IVPB 1 g 100 mL/hr   02/12/16 2101 Given   aztreonam (AZACTAM) 1 g in dextrose 5 % 50 mL IVPB 1 g 100 mL/hr   02/12/16 2101 Given   oseltamivir (TAMIFLU) capsule 30 mg 30 mg    02/12/16 2300 Given   vancomycin (VANCOCIN) IVPB 1000 mg/200 mL premix 1,000 mg 200 mL/hr   02/13/16 0445 Given   aztreonam (AZACTAM) 1 g in dextrose 5 % 50 mL IVPB 1 g 100 mL/hr   02/13/16 1019 Given   oseltamivir (TAMIFLU) capsule 30 mg 30 mg    02/13/16 1215 Given   aztreonam (AZACTAM) 1 g  in dextrose 5 % 50 mL IVPB 1 g 100 mL/hr   02/13/16 2130 Given   aztreonam (AZACTAM) 1 g in dextrose 5 % 50 mL IVPB 1 g 100 mL/hr   02/13/16 2249 Given   oseltamivir (TAMIFLU) capsule 30 mg 30 mg    02/13/16 2355 Given   vancomycin (VANCOCIN) IVPB 1000 mg/200 mL premix 1,000 mg 200 mL/hr   02/14/16 0445 Given   aztreonam (AZACTAM) 1 g in dextrose 5 % 50 mL IVPB 1 g 100 mL/hr   02/14/16 1032 Given   oseltamivir (TAMIFLU) capsule 30 mg 30 mg    02/14/16 1313 Given   aztreonam (AZACTAM) 1 g in dextrose 5 % 50 mL IVPB 1 g 100 mL/hr      Principal Problem:   HCAP (healthcare-associated pneumonia) Active Problems:   Essential hypertension, benign   Hypothyroidism   Chronic atrial fibrillation (HCC)   Hyponatremia   GERD (gastroesophageal reflux disease)   History of TIA (transient ischemic attack)   Carotid artery disease (HCC)   Fever   Sepsis ([place])   Hypokalemia   Acute encephalopathy   Pressure ulcer    Time spent: 25 min    [facility] Pager [phone]. If 7PM-7AM, please contact night-coverage at [email or web address], password [facility] 02/14/2016, 4:57 PM  LOS: 3 days

## 2016-02-15 DIAGNOSIS — J101 Influenza due to other identified influenza virus with other respiratory manifestations: Secondary | ICD-10-CM | POA: Diagnosis present

## 2016-02-15 DIAGNOSIS — I82409 Acute embolism and thrombosis of unspecified deep veins of unspecified lower extremity: Secondary | ICD-10-CM | POA: Diagnosis present

## 2016-02-15 MED ORDER — ACETAMINOPHEN 325 MG PO TABS: 650.0000 mg | ORAL_TABLET | ORAL | Status: DC | PRN

## 2016-02-15 MED ORDER — TRAMADOL HCL 50 MG PO TABS
100.0000 mg | ORAL_TABLET | Freq: Four times a day (QID) | ORAL | Status: DC | PRN
  Administered 2016-02-15 – 2016-02-16 (×3): 100 mg via ORAL
  Filled 2016-02-15 (×5): qty 2

## 2016-02-15 MED ORDER — LEVOFLOXACIN 750 MG PO TABS
750.0000 mg | ORAL_TABLET | ORAL | Status: AC
  Administered 2016-02-15 – 2016-02-17 (×2): 750 mg via ORAL
  Filled 2016-02-15 (×2): qty 1

## 2016-02-15 NOTE — Progress Notes (Signed)
[facility] PROGRESS NOTE  Jasmine Torres [id number] DOB: 1925-05-20 DOA: 02/11/2016 PCP: [name], MD  Summary 02/15/16: I have seen and examined Jasmine Torres at bedside in the presence of her daughter and reviewed her chart. Patient has had a complicated last 4 weeks. Initially admitted to hospital after fall for non-operative hip fx. Went to rehab only to return a few days later for low Hgb. She then went back to rehab and returned for hyponatremia. Patient now presents to the hospital with AMS, lethargy and SOB. She was found to be flu B positive here. There was also mention of a pneumonia which is being treated. Patient was also found to have a distal DVT. Recommendation during a previous admission was no anticoagulation (ASA/plavix) until at least 3/3. She remains somewhat disoriented today and complains of hip/chest pain. Her daughter prefers less strong pain medications as she does not want her mom to be "doped". Will continue Tamiflu to complete course of treatment for influenza but transition antibiotics to Levaquin oral as septic workup remains otherwise negative. Will also add tramadol which should not affect the patient's mentation. Plan HCAP (healthcare-associated pneumonia)/Influenza type B  Continue Tamiflu to complete 5 days  Change antibiotics to Levaquin to complete 2 more days Acute DVT  Not anticoagulated because of fall risk  Continue Lovenox Essential hypertension, benign/Hypothyroidism/Chronic atrial fibrillation (HCC)/Hyponatremia/GERD (gastroesophageal reflux disease)/History of TIA (transient ischemic attack)/Carotid artery disease (HCC)  No acute changes  Continue current management Pressure ulcer  Stage II sacral decubitus ulcer, present on admission  Monitor, regular turns  Code Status: Full Code Family Communication: daughter at bedside Disposition Plan: ?SNF in the next couple of  days   Consultants:    Procedures:    Antibiotics:  Tamiflu  Levaquin  HPI/Subjective: Complains of chest and hip pain  Objective: Filed Vitals:   02/15/16 0532 02/15/16 1528  BP: 139/81   Pulse: 106 72  Temp: 98.2 F (36.8 C) 98.7 F (37.1 C)  Resp: 20 18    Intake/Output Summary (Last 24 hours) at 02/15/16 1958 Last data filed at 02/15/16 0535  Gross per 24 hour  Intake    220 ml  Output    200 ml  Net     20 ml   Filed Weights   02/11/16 2203  Weight: 57.607 kg (127 lb)    Exam:   General:  Appears to be in discomfort.  Cardiovascular: S1-S2 normal. No murmurs. Pulse regular.  Respiratory: Good air entry bilaterally. No rhonchi or rales.  Abdomen: Soft and nontender. Normal bowel sounds. No organomegaly.  Musculoskeletal: No pedal edema. Appears to have pain with passive motion   Neurological: No focal deficits, seems disoriented.  Data Reviewed: Basic Metabolic Panel:  Recent Labs Lab 02/11/16 1840 02/12/16 0145 02/13/16 0517 02/14/16 0434  NA 132*  --  132* 134*  K 3.4*  --  3.2* 3.8  CL 100*  --  100* 102  CO2 23  --  22 24  GLUCOSE 118*  --  112* 113*  BUN 17  --  10 13  CREATININE 0.62  --  0.48 0.35*  CALCIUM 8.2*  --  7.6* 8.2*  MG  --  2.2 2.0 2.2   Liver Function Tests:  Recent Labs Lab 02/11/16 1840  AST 64*  ALT 54  ALKPHOS 181*  BILITOT 1.3*  PROT 5.8*  ALBUMIN 3.2*   No results for input(s): LIPASE, AMYLASE in the last 168 hours. No results for input(s): AMMONIA in the  last 168 hours. CBC:  Recent Labs Lab 02/11/16 1840 02/13/16 0517 02/14/16 0434  WBC 10.1 7.0 6.1  NEUTROABS 8.4*  --   --   HGB 10.7* 10.1* 10.4*  HCT 33.0* 32.2* 32.1*  MCV 97.1 98.8 96.4  PLT 230 251 280   Cardiac Enzymes:  Recent Labs Lab 02/11/16 2034  TROPONINI 0.04*   BNP (last 3 results)  Recent Labs  02/11/16 2034  BNP 396.7*    ProBNP (last 3 results) No results for input(s): PROBNP in the last 8760  hours.  CBG: No results for input(s): GLUCAP in the last 168 hours.  Recent Results (from the past 240 hour(s))  Blood Culture (routine x 2)     Status: None (Preliminary result)   Collection Time: 02/11/16  6:40 PM  Result Value Ref Range Status   Specimen Description BLOOD RIGHT ARM  Final   Special Requests BOTTLES DRAWN AEROBIC AND ANAEROBIC 5CC  Final   Culture   Final    NO GROWTH 3 DAYS Performed at [facility]    Report Status PENDING  Incomplete  Blood Culture (routine x 2)     Status: None (Preliminary result)   Collection Time: 02/11/16  6:40 PM  Result Value Ref Range Status   Specimen Description RIGHT ANTECUBITAL  Final   Special Requests BOTTLES DRAWN AEROBIC AND ANAEROBIC 5CC  Final   Culture   Final    NO GROWTH 3 DAYS Performed at [facility]    Report Status PENDING  Incomplete  Urine culture     Status: None   Collection Time: 02/11/16  7:52 PM  Result Value Ref Range Status   Specimen Description URINE, CATHETERIZED  Final   Special Requests NONE  Final   Culture   Final    NO GROWTH 1 DAY Performed at [facility]    Report Status 02/13/2016 FINAL  Final  MRSA PCR Screening     Status: None   Collection Time: 02/11/16 10:23 PM  Result Value Ref Range Status   MRSA by PCR NEGATIVE NEGATIVE Final    Comment:        The GeneXpert MRSA Assay (FDA approved for NASAL specimens only), is one component of a comprehensive MRSA colonization surveillance program. It is not intended to diagnose MRSA infection nor to guide or monitor treatment for MRSA infections.   Respiratory virus panel     Status: Abnormal   Collection Time: 02/11/16 11:02 PM  Result Value Ref Range Status   Respiratory Syncytial Virus A Negative Negative Final   Respiratory Syncytial Virus B Negative Negative Final   Influenza A Negative Negative Final   Influenza B Positive (A) Negative Final   Parainfluenza 1 Negative Negative Final   Parainfluenza 2  Negative Negative Final   Parainfluenza 3 Negative Negative Final   Metapneumovirus Negative Negative Final   Rhinovirus Negative Negative Final   Adenovirus Negative Negative Final    Comment: (NOTE) Performed At: [facility] [place], [place] [id number] Lindon Romp MD [id number]      Studies: No results found.  Scheduled Meds: . enoxaparin (LOVENOX) injection  40 mg Subcutaneous QHS  . levalbuterol  1.25 mg Nebulization TID  . levofloxacin  750 mg Oral Q48H  . levothyroxine  50 mcg Oral QAC breakfast  . metoprolol tartrate  25 mg Oral BID  . oseltamivir  30 mg Oral BID  . pantoprazole  40 mg Oral QHS   Continuous Infusions:  Time spent: 25 minutes    Xavier Fournier  [facility] Pager [phone]. If 7PM-7AM, please contact night-coverage at [email or web address], password [facility] 02/15/2016, 7:58 PM  LOS: 4 days

## 2016-02-15 NOTE — Progress Notes (Signed)
CSW following for return to [facility] when medically ready. CSW has completed FL2 & will continue to follow and assist with return.    Raynaldo Opitz, [facility] Clinical Social Worker cell #: [phone]

## 2016-02-15 NOTE — Progress Notes (Signed)
Physical Therapy Treatment Patient Details Name: Jasmine Torres MRN: [id number] DOB: 1925-11-01 Today's Date: 02/15/2016    History of Present Illness 82 y.o. female admitted from SNF with HCAP, ? flu, B heel pressure ulcers (stage 1 L, stage 2 R).    PMH of atrial fibrillation, hypertension, and TIA s/p fall down stairs 01/14/16 with pelvic fx.  Pt also found to have: DVT noted in the Right peroneal, Right soleal, and Left gastroc veins with plans for no anticoagulation at this time due to recent bleeding    PT Comments    Pt with decreased cognition today per RN and daughter however assisted to sitting EOB.  Pt did not tolerate well due to increased pelvic pain so assisted back to supine.  Follow Up Recommendations  SNF     Equipment Recommendations  Rolling walker with 5" wheels    Recommendations for Other Services       Precautions / Restrictions Precautions Precautions: Fall Restrictions Other Position/Activity Restrictions: WBAT per Dr. [name] (EDP consult in Dr. Quincy Sheehan notes)    Mobility  Bed Mobility Overal bed mobility: +2 for physical assistance;Needs Assistance Bed Mobility: Supine to Sit;Sit to Supine     Supine to sit: +2 for physical assistance;HOB elevated;Total assist Sit to supine: Total assist;+2 for physical assistance   General bed mobility comments: pt initiating however required total assist to raise trunk and move BLEs, tolerance limited by pain, returned pt to supine with heels floating  Transfers                 General transfer comment: not tested, pt unable to tolerate mobility 2* pain  Ambulation/Gait                 Stairs            Wheelchair Mobility    Modified Rankin (Stroke Patients Only)       Balance                                    Cognition Arousal/Alertness: Awake/alert Behavior During Therapy: Anxious Overall Cognitive Status: Impaired/Different from baseline Area of Impairment:  Memory               General Comments: occasionally nonsensical, cognition different then baseline per daughter    Exercises      General Comments        Pertinent Vitals/Pain Pain Assessment: Faces Faces Pain Scale: Hurts even more Pain Location: R pelvis with mobility Pain Descriptors / Indicators: Sore;Grimacing;Guarding Pain Intervention(s): Limited activity within patient's tolerance;Monitored during session;Repositioned;RN gave pain meds during session    Home Living                      Prior Function            PT Goals (current goals can now be found in the care plan section) Progress towards PT goals: Not progressing toward goals - comment (impaired cognition today, also pain)    Frequency  Min 3X/week    PT Plan Current plan remains appropriate    Co-evaluation             End of Session   Activity Tolerance: Patient limited by pain Patient left: with call bell/phone within reach;with family/visitor present;with bed alarm set;in bed     Time: [id number] PT Time Calculation (min) (ACUTE ONLY): 15 min  Charges:  $  Therapeutic Activity: 8-22 mins                    G Codes:      Dari Carpenito,KATHrine E 02/19/16, 3:12 PM Carmelia Bake, PT, DPT 02/19/16 Pager: [phone]

## 2016-02-16 LAB — BASIC METABOLIC PANEL
ANION GAP: 9 (ref 5–15)
BUN: 16 mg/dL (ref 6–20)
CALCIUM: 8.3 mg/dL — AB (ref 8.9–10.3)
CO2: 22 mmol/L (ref 22–32)
CREATININE: 0.5 mg/dL (ref 0.44–1.00)
Chloride: 103 mmol/L (ref 101–111)
GLUCOSE: 89 mg/dL (ref 65–99)
Potassium: 3.8 mmol/L (ref 3.5–5.1)
Sodium: 134 mmol/L — ABNORMAL LOW (ref 135–145)

## 2016-02-16 LAB — CBC
HCT: 35.8 % — ABNORMAL LOW (ref 36.0–46.0)
Hemoglobin: 11.4 g/dL — ABNORMAL LOW (ref 12.0–15.0)
MCH: 30.8 pg (ref 26.0–34.0)
MCHC: 31.8 g/dL (ref 30.0–36.0)
MCV: 96.8 fL (ref 78.0–100.0)
PLATELETS: 357 10*3/uL (ref 150–400)
RBC: 3.7 MIL/uL — ABNORMAL LOW (ref 3.87–5.11)
RDW: 16.5 % — AB (ref 11.5–15.5)
WBC: 6.2 10*3/uL (ref 4.0–10.5)

## 2016-02-16 MED ORDER — LEVALBUTEROL HCL 0.63 MG/3ML IN NEBU: 0.6300 mg | INHALATION_SOLUTION | Freq: Four times a day (QID) | RESPIRATORY_TRACT | Status: DC | PRN

## 2016-02-16 NOTE — Progress Notes (Signed)
[facility] PROGRESS NOTE  Jasmine Torres [id number] DOB: [birth year] DOA: 02/11/2016 PCP: [name], MD  Summary 02/15/16: I have seen and examined Jasmine Torres at bedside in the presence of her daughter and reviewed her chart. Patient has had a complicated last 4 weeks. Initially admitted to hospital after fall for non-operative hip fx. Went to rehab only to return a few days later for low Hgb. She then went back to rehab and returned for hyponatremia. Patient now presents to the hospital with AMS, lethargy and SOB. She was found to be flu B positive here. There was also mention of a pneumonia which is being treated. Patient was also found to have a distal DVT. Recommendation during a previous admission was no anticoagulation (ASA/plavix) until at least 3/3. She remains somewhat disoriented today and complains of hip/chest pain. Her daughter prefers less strong pain medications as she does not want her mom to be "doped". Will continue Tamiflu to complete course of treatment for influenza but transition antibiotics to Levaquin oral as septic workup remains otherwise negative. Will also add tramadol which should not affect the patient's mentation. 02/16/16: Patient more with it today. Less  Pain. Will continue tamiflu, levaquin.  Likely d/c tomorrow Plan HCAP (healthcare-associated pneumonia)/Influenza type B  Continue Tamiflu to complete 5 days  Continue Levaquin to complete 1 more day Acute DVT  Not anticoagulated because of fall risk  Continue Lovenox for prophylaxis Essential hypertension, benign/Hypothyroidism/Chronic atrial fibrillation (HCC)/Hyponatremia/GERD (gastroesophageal reflux disease)/History of TIA (transient ischemic attack)/Carotid artery disease (HCC)  No acute changes  Continue current management Pressure ulcer  Stage II sacral decubitus ulcer, present on admission  Monitor, regular turns  Code Status: Full Code Family Communication: daughter at  bedside Disposition Plan: ?SNF tomorrow   Consultants:    Procedures:    Antibiotics:  Tamiflu  Levaquin HPI/Subjective: No complaints   Objective: Filed Vitals:   02/16/16 1457 02/16/16 2051  BP: 130/68 150/98  Pulse: 82 108  Temp: 98.4 F (36.9 C) 98.2 F (36.8 C)  Resp: 18 18    Intake/Output Summary (Last 24 hours) at 02/16/16 2359 Last data filed at 02/16/16 1500  Gross per 24 hour  Intake    360 ml  Output    250 ml  Net    110 ml   Filed Weights   02/11/16 2203  Weight: 57.607 kg (127 lb)    Exam:   General:  Comfortable at rest.  Cardiovascular: S1-S2 normal. No murmurs. Pulse regular.  Respiratory: Good air entry bilaterally. No rhonchi or rales.  Abdomen: Soft and nontender. Normal bowel sounds. No organomegaly.  Musculoskeletal: No pedal edema   Neurological: Intact  Data Reviewed: Basic Metabolic Panel:  Recent Labs Lab 02/11/16 1840 02/12/16 0145 02/13/16 0517 02/14/16 0434 02/16/16 0511  NA 132*  --  132* 134* 134*  K 3.4*  --  3.2* 3.8 3.8  CL 100*  --  100* 102 103  CO2 23  --  22 24 22   GLUCOSE 118*  --  112* 113* 89  BUN 17  --  10 13 16   CREATININE 0.62  --  0.48 0.35* 0.50  CALCIUM 8.2*  --  7.6* 8.2* 8.3*  MG  --  2.2 2.0 2.2  --    Liver Function Tests:  Recent Labs Lab 02/11/16 1840  AST 64*  ALT 54  ALKPHOS 181*  BILITOT 1.3*  PROT 5.8*  ALBUMIN 3.2*   No results for input(s): LIPASE, AMYLASE in the last 168 hours.  No results for input(s): AMMONIA in the last 168 hours. CBC:  Recent Labs Lab 02/11/16 1840 02/13/16 0517 02/14/16 0434 02/16/16 0511  WBC 10.1 7.0 6.1 6.2  NEUTROABS 8.4*  --   --   --   HGB 10.7* 10.1* 10.4* 11.4*  HCT 33.0* 32.2* 32.1* 35.8*  MCV 97.1 98.8 96.4 96.8  PLT 230 251 280 357   Cardiac Enzymes:  Recent Labs Lab 02/11/16 2034  TROPONINI 0.04*   BNP (last 3 results)  Recent Labs  02/11/16 2034  BNP 396.7*    ProBNP (last 3 results) No results for  input(s): PROBNP in the last 8760 hours.  CBG: No results for input(s): GLUCAP in the last 168 hours.  Recent Results (from the past 240 hour(s))  Blood Culture (routine x 2)     Status: None (Preliminary result)   Collection Time: 02/11/16  6:40 PM  Result Value Ref Range Status   Specimen Description BLOOD RIGHT ARM  Final   Special Requests BOTTLES DRAWN AEROBIC AND ANAEROBIC 5CC  Final   Culture   Final    NO GROWTH 4 DAYS Performed at [facility]    Report Status PENDING  Incomplete  Blood Culture (routine x 2)     Status: None (Preliminary result)   Collection Time: 02/11/16  6:40 PM  Result Value Ref Range Status   Specimen Description RIGHT ANTECUBITAL  Final   Special Requests BOTTLES DRAWN AEROBIC AND ANAEROBIC 5CC  Final   Culture   Final    NO GROWTH 4 DAYS Performed at [facility]    Report Status PENDING  Incomplete  Urine culture     Status: None   Collection Time: 02/11/16  7:52 PM  Result Value Ref Range Status   Specimen Description URINE, CATHETERIZED  Final   Special Requests NONE  Final   Culture   Final    NO GROWTH 1 DAY Performed at [facility]    Report Status 02/13/2016 FINAL  Final  MRSA PCR Screening     Status: None   Collection Time: 02/11/16 10:23 PM  Result Value Ref Range Status   MRSA by PCR NEGATIVE NEGATIVE Final    Comment:        The GeneXpert MRSA Assay (FDA approved for NASAL specimens only), is one component of a comprehensive MRSA colonization surveillance program. It is not intended to diagnose MRSA infection nor to guide or monitor treatment for MRSA infections.   Respiratory virus panel     Status: Abnormal   Collection Time: 02/11/16 11:02 PM  Result Value Ref Range Status   Respiratory Syncytial Virus A Negative Negative Final   Respiratory Syncytial Virus B Negative Negative Final   Influenza A Negative Negative Final   Influenza B Positive (A) Negative Final   Parainfluenza 1 Negative  Negative Final   Parainfluenza 2 Negative Negative Final   Parainfluenza 3 Negative Negative Final   Metapneumovirus Negative Negative Final   Rhinovirus Negative Negative Final   Adenovirus Negative Negative Final    Comment: (NOTE) Performed At: [facility] [place], [place] [id number] Lindon Romp MD [id number]      Studies: No results found.  Scheduled Meds: . enoxaparin (LOVENOX) injection  40 mg Subcutaneous QHS  . levofloxacin  750 mg Oral Q48H  . levothyroxine  50 mcg Oral QAC breakfast  . metoprolol tartrate  25 mg Oral BID  . pantoprazole  40 mg Oral QHS   Continuous  Infusions:    Time spent: 25 minutes    Dontavious Emily  [facility] Pager [phone]. If 7PM-7AM, please contact night-coverage at [email or web address], password [facility] 02/16/2016, 11:59 PM  LOS: 5 days

## 2016-02-17 LAB — BASIC METABOLIC PANEL
ANION GAP: 10 (ref 5–15)
BUN: 21 mg/dL — AB (ref 6–20)
CALCIUM: 8.7 mg/dL — AB (ref 8.9–10.3)
CO2: 23 mmol/L (ref 22–32)
Chloride: 102 mmol/L (ref 101–111)
Creatinine, Ser: 0.55 mg/dL (ref 0.44–1.00)
GFR calc Af Amer: >60 mL/min (ref >60)
GLUCOSE: 115 mg/dL — AB (ref 65–99)
POTASSIUM: 3.8 mmol/L (ref 3.5–5.1)
SODIUM: 135 mmol/L (ref 135–145)

## 2016-02-17 LAB — CULTURE, BLOOD (ROUTINE X 2)
CULTURE: NO GROWTH
Culture: NO GROWTH

## 2016-02-17 LAB — CBC
HCT: 37.4 % (ref 36.0–46.0)
HEMOGLOBIN: 11.9 g/dL — AB (ref 12.0–15.0)
MCH: 31 pg (ref 26.0–34.0)
MCHC: 31.8 g/dL (ref 30.0–36.0)
MCV: 97.4 fL (ref 78.0–100.0)
Platelets: 443 10*3/uL — ABNORMAL HIGH (ref 150–400)
RBC: 3.84 MIL/uL — AB (ref 3.87–5.11)
RDW: 16.8 % — ABNORMAL HIGH (ref 11.5–15.5)
WBC: 6.7 10*3/uL (ref 4.0–10.5)

## 2016-02-17 MED ORDER — CLONAZEPAM 0.5 MG PO TABS: 0.2500 mg | ORAL_TABLET | Freq: Two times a day (BID) | ORAL | Status: DC | PRN

## 2016-02-17 MED ORDER — LORAZEPAM 2 MG/ML IJ SOLN
0.5000 mg | INTRAMUSCULAR | Status: DC | PRN
  Administered 2016-02-18 – 2016-02-20 (×7): 0.5 mg via INTRAVENOUS
  Filled 2016-02-17 (×7): qty 1

## 2016-02-17 MED ORDER — MORPHINE SULFATE (PF) 2 MG/ML IV SOLN
1.0000 mg | INTRAVENOUS | Status: DC | PRN
  Administered 2016-02-17 – 2016-02-19 (×6): 1 mg via INTRAVENOUS
  Filled 2016-02-17 (×6): qty 1

## 2016-02-17 NOTE — Progress Notes (Signed)
CSW confirmed with Whitney at [facility] that they would be able to take patient over the weekend if ready.   Please call weekend CSW (ph#: [phone]) to facilitate discharge.      Raynaldo Opitz, [facility] Clinical Social Worker cell #: [phone]

## 2016-02-17 NOTE — Progress Notes (Signed)
PT Cancellation Note  Patient Details Name: Jasmine Torres MRN: [id number] DOB: [birth year]   Cancelled Treatment:     cancel per RN.  Pt demonstrating increased confusion.     Nathanial Rancher 02/17/2016, 3:37 PM

## 2016-02-17 NOTE — Progress Notes (Signed)
Patient crying and very guarded.  Family friend at bedside.  Patient stated she was hurting but would not allow RN to give PO pain medications. RN attempted for family friend to give med to her for familiarity but was unsuccessful.  Patient stated "I just don't want to live like this" while crying.  MD made aware and new orders received for IV Morphine since patient was refusing PO medications.  Patient's daughter was resistant to patient receiving "sedating medication" when I spoke with her on the phone during this time.  Family friend at bedside spoke with daughter regarding the patient and daughter stated it was "ok to give the medication."  Morphine was given IV and patient became more comfortable.  Will continue to monitor.

## 2016-02-17 NOTE — Progress Notes (Signed)
[facility] PROGRESS NOTE  Jasmine Torres [id number] DOB: 05-16-1925 DOA: 02/11/2016 PCP: [name], MD  Summary 02/15/16: I have seen and examined Jasmine Torres at bedside in the presence of her daughter and reviewed her chart. Patient has had a complicated last 4 weeks. Initially admitted to hospital after fall for non-operative hip fx. Went to rehab only to return a few days later for low Hgb. She then went back to rehab and returned for hyponatremia. Patient now presents to the hospital with AMS, lethargy and SOB. She was found to be flu B positive here. There was also mention of a pneumonia which is being treated. Patient was also found to have a distal DVT. Recommendation during a previous admission was no anticoagulation (ASA/plavix) until at least 3/3. She remains somewhat disoriented today and complains of hip/chest pain. Her daughter prefers less strong pain medications as she does not want her mom to be "doped". Will continue Tamiflu to complete course of treatment for influenza but transition antibiotics to Levaquin oral as septic workup remains otherwise negative. Will also add tramadol which should not affect the patient's mentation. 02/16/16: Patient more with it today. Less Pain. Will continue tamiflu, levaquin. Likely d/c tomorrow. 02/17/16: Patient continues to have confusion. Had long discussion with patient's daughter and son-in-law at the bedside and they would like to explore hospice enrollment/help with goals of care as patient's sensorium remains clouded and she appears to be in discomfort. This is despite course of antibiotics and minimal psychoaffective medications. It appears that patient has undergone a steep decline in her clinical condition since end of January. She appears to be in discomfort this evening, refusing oral medications. We'll therefore consult palliative care for goals of care assessment and consideration for hospice enrollment. Meanwhile, will check  TSH/vitamin B 12 level/RPR as there is question of dementia, hence evaluate for treatable causes. It is possible that the acute event end of January unmasked mild dementia and patient is not recovering significantly despite acute interventions for different acute illnesses since then. Plan HCAP (healthcare-associated pneumonia)/Influenza type B  Completed course of Tamiflu  Completed course of Levaquin  Acute DVT  Not anticoagulated because of fall risk  Continue Lovenox for prophylaxis Essential hypertension, benign/Hypothyroidism/Chronic atrial fibrillation (HCC)/Hyponatremia/GERD (gastroesophageal reflux disease)/History of TIA (transient ischemic attack)/Carotid artery disease (HCC)  No acute changes  Continue current management Pressure ulcer  Stage II sacral decubitus ulcer, present on admission  Monitor, regular turns  Code Status: Full Code Family Communication: daughter at bedside Disposition Plan: Depends on further clarification of goals of care. Consult the palliative care.   Consultants:  Palliative care  Procedures:    Antibiotics:  Tamiflu>>02/16/16  Levaquin>>02/17/16  HPI/Subjective: Patient not making much sense. Seems to be in pain. Disoriented.  Objective: Filed Vitals:   02/17/16 0625 02/17/16 1619  BP: 153/95 127/74  Pulse: 87 68  Temp: 98.7 F (37.1 C) 98.7 F (37.1 C)  Resp: 18 18    Intake/Output Summary (Last 24 hours) at 02/17/16 1751 Last data filed at 02/17/16 1734  Gross per 24 hour  Intake    140 ml  Output    750 ml  Net   -610 ml   Filed Weights   02/11/16 2203  Weight: 57.607 kg (127 lb)    Exam:   General:  Restless.  Cardiovascular: S1-S2 normal. No murmurs. Pulse regular.  Respiratory: Good air entry bilaterally. No rhonchi or rales.  Abdomen: Soft and nontender. Normal bowel sounds. No organomegaly.  Musculoskeletal: No  pedal edema   Neurological: Disoriented, moving all extremities.  Data  Reviewed: Basic Metabolic Panel:  Recent Labs Lab 02/11/16 1840 02/12/16 0145 02/13/16 0517 02/14/16 0434 02/16/16 0511 02/17/16 0525  NA 132*  --  132* 134* 134* 135  K 3.4*  --  3.2* 3.8 3.8 3.8  CL 100*  --  100* 102 103 102  CO2 23  --  22 24 22 23   GLUCOSE 118*  --  112* 113* 89 115*  BUN 17  --  10 13 16  21*  CREATININE 0.62  --  0.48 0.35* 0.50 0.55  CALCIUM 8.2*  --  7.6* 8.2* 8.3* 8.7*  MG  --  2.2 2.0 2.2  --   --    Liver Function Tests:  Recent Labs Lab 02/11/16 1840  AST 64*  ALT 54  ALKPHOS 181*  BILITOT 1.3*  PROT 5.8*  ALBUMIN 3.2*   No results for input(s): LIPASE, AMYLASE in the last 168 hours. No results for input(s): AMMONIA in the last 168 hours. CBC:  Recent Labs Lab 02/11/16 1840 02/13/16 0517 02/14/16 0434 02/16/16 0511 02/17/16 0525  WBC 10.1 7.0 6.1 6.2 6.7  NEUTROABS 8.4*  --   --   --   --   HGB 10.7* 10.1* 10.4* 11.4* 11.9*  HCT 33.0* 32.2* 32.1* 35.8* 37.4  MCV 97.1 98.8 96.4 96.8 97.4  PLT 230 251 280 357 443*   Cardiac Enzymes:  Recent Labs Lab 02/11/16 2034  TROPONINI 0.04*   BNP (last 3 results)  Recent Labs  02/11/16 2034  BNP 396.7*    ProBNP (last 3 results) No results for input(s): PROBNP in the last 8760 hours.  CBG: No results for input(s): GLUCAP in the last 168 hours.  Recent Results (from the past 240 hour(s))  Blood Culture (routine x 2)     Status: None   Collection Time: 02/11/16  6:40 PM  Result Value Ref Range Status   Specimen Description BLOOD RIGHT ARM  Final   Special Requests BOTTLES DRAWN AEROBIC AND ANAEROBIC 5CC  Final   Culture   Final    NO GROWTH 5 DAYS Performed at [facility]    Report Status 02/17/2016 FINAL  Final  Blood Culture (routine x 2)     Status: None   Collection Time: 02/11/16  6:40 PM  Result Value Ref Range Status   Specimen Description RIGHT ANTECUBITAL  Final   Special Requests BOTTLES DRAWN AEROBIC AND ANAEROBIC 5CC  Final   Culture   Final     NO GROWTH 5 DAYS Performed at [facility]    Report Status 02/17/2016 FINAL  Final  Urine culture     Status: None   Collection Time: 02/11/16  7:52 PM  Result Value Ref Range Status   Specimen Description URINE, CATHETERIZED  Final   Special Requests NONE  Final   Culture   Final    NO GROWTH 1 DAY Performed at [facility]    Report Status 02/13/2016 FINAL  Final  MRSA PCR Screening     Status: None   Collection Time: 02/11/16 10:23 PM  Result Value Ref Range Status   MRSA by PCR NEGATIVE NEGATIVE Final    Comment:        The GeneXpert MRSA Assay (FDA approved for NASAL specimens only), is one component of a comprehensive MRSA colonization surveillance program. It is not intended to diagnose MRSA infection nor to guide or monitor treatment for MRSA infections.  Respiratory virus panel     Status: Abnormal   Collection Time: 02/11/16 11:02 PM  Result Value Ref Range Status   Respiratory Syncytial Virus A Negative Negative Final   Respiratory Syncytial Virus B Negative Negative Final   Influenza A Negative Negative Final   Influenza B Positive (A) Negative Final   Parainfluenza 1 Negative Negative Final   Parainfluenza 2 Negative Negative Final   Parainfluenza 3 Negative Negative Final   Metapneumovirus Negative Negative Final   Rhinovirus Negative Negative Final   Adenovirus Negative Negative Final    Comment: (NOTE) Performed At: [facility] [place], [place] [id number] Lindon Romp MD [id number]      Studies: No results found.  Scheduled Meds: . enoxaparin (LOVENOX) injection  40 mg Subcutaneous QHS  . levothyroxine  50 mcg Oral QAC breakfast  . metoprolol tartrate  25 mg Oral BID  . pantoprazole  40 mg Oral QHS   Continuous Infusions:    Time spent: 25 minutes    Emie Sommerfeld  [facility] Pager [phone]. If 7PM-7AM, please contact night-coverage at [email or web address], password [facility] 02/17/2016,  5:51 PM  LOS: 6 days

## 2016-02-18 DIAGNOSIS — Z515 Encounter for palliative care: Secondary | ICD-10-CM | POA: Diagnosis not present

## 2016-02-18 LAB — BASIC METABOLIC PANEL
ANION GAP: 11 (ref 5–15)
BUN: 20 mg/dL (ref 6–20)
CALCIUM: 8.5 mg/dL — AB (ref 8.9–10.3)
CO2: 21 mmol/L — ABNORMAL LOW (ref 22–32)
Chloride: 102 mmol/L (ref 101–111)
Creatinine, Ser: 0.59 mg/dL (ref 0.44–1.00)
Glucose, Bld: 94 mg/dL (ref 65–99)
POTASSIUM: 3.7 mmol/L (ref 3.5–5.1)
SODIUM: 134 mmol/L — AB (ref 135–145)

## 2016-02-18 LAB — TSH: TSH: 27.887 u[IU]/mL — AB (ref 0.350–4.500)

## 2016-02-18 LAB — CBC
HCT: 36.9 % (ref 36.0–46.0)
Hemoglobin: 11.7 g/dL — ABNORMAL LOW (ref 12.0–15.0)
MCH: 31.2 pg (ref 26.0–34.0)
MCHC: 31.7 g/dL (ref 30.0–36.0)
MCV: 98.4 fL (ref 78.0–100.0)
PLATELETS: 434 10*3/uL — AB (ref 150–400)
RBC: 3.75 MIL/uL — AB (ref 3.87–5.11)
RDW: 17.4 % — AB (ref 11.5–15.5)
WBC: 6.9 10*3/uL (ref 4.0–10.5)

## 2016-02-18 LAB — VITAMIN B12: Vitamin B-12: 698 pg/mL (ref 180–914)

## 2016-02-18 MED ORDER — LEVOTHYROXINE SODIUM 100 MCG IV SOLR
50.0000 ug | Freq: Every day | INTRAVENOUS | Status: DC
  Administered 2016-02-18 – 2016-02-23 (×6): 50 ug via INTRAVENOUS
  Filled 2016-02-18 (×7): qty 5

## 2016-02-18 MED ORDER — KETOROLAC TROMETHAMINE 15 MG/ML IJ SOLN
15.0000 mg | Freq: Once | INTRAMUSCULAR | Status: AC
  Administered 2016-02-18: 15 mg via INTRAVENOUS
  Filled 2016-02-18: qty 1

## 2016-02-18 MED ORDER — KCL IN DEXTROSE-NACL 20-5-0.9 MEQ/L-%-% IV SOLN
INTRAVENOUS | Status: AC
  Administered 2016-02-18: 16:00:00 via INTRAVENOUS
  Filled 2016-02-18 (×2): qty 1000

## 2016-02-18 NOTE — Progress Notes (Signed)
Agree with previous RN's assessment. Zariah Cavendish, RN 

## 2016-02-18 NOTE — Progress Notes (Signed)
[facility] PROGRESS NOTE  Jasmine Torres [id number] DOB: 1924/12/25 DOA: 02/11/2016 PCP: [name], MD  Summary 02/15/16: I have seen and examined [name] at bedside in the presence of her daughter and reviewed her chart. Patient has had a complicated last 4 weeks. Initially admitted to hospital after fall for non-operative hip fx. Went to rehab only to return a few days later for low Hgb. She then went back to rehab and returned for hyponatremia. Patient now presents to the hospital with AMS, lethargy and SOB. She was found to be flu B positive here. There was also mention of a pneumonia which is being treated. Patient was also found to have a distal DVT. Recommendation during a previous admission was no anticoagulation (ASA/plavix) until at least 3/3. She remains somewhat disoriented today and complains of hip/chest pain. Her daughter prefers less strong pain medications as she does not want her mom to be "doped". Will continue Tamiflu to complete course of treatment for influenza but transition antibiotics to Levaquin oral as septic workup remains otherwise negative. Will also add tramadol which should not affect the patient's mentation. 02/16/16: Patient more with it today. Less Pain. Will continue tamiflu, levaquin. Likely d/c tomorrow. 02/17/16: Patient continues to have confusion. Had long discussion with patient's daughter and son-in-law at the bedside and they would like to explore hospice enrollment/help with goals of care as patient's sensorium remains clouded and she appears to be in discomfort. This is despite course of antibiotics and minimal psychoaffective medications. It appears that patient has undergone a steep decline in her clinical condition since end of January. She appears to be in discomfort this evening, refusing oral medications. We'll therefore consult palliative care for goals of care assessment and consideration for hospice enrollment. Meanwhile, will check  TSH/vitamin B 12 level/RPR as there is question of dementia, hence evaluate for treatable causes. It is possible that the acute event end of January unmasked mild dementia and patient is not recovering significantly despite acute interventions for different acute illnesses since then. 02/18/16: Appreciate palliative care. Patient has high TSH level of 27 which is in keeping with prior value of 17(around February 20th at [facility]). Obviously, uncontrolled hypothyroidism is part of the current complex of presentations including the confusion(?treatable cause of demntia). Patient appears to be in pain still. The balance between pain control and a clear sensorium iis one that has to be  Delicately managed. It will be hard to know if  IV Synthroid is having an impact(we will give synthroid iv as patient not taking much by mouth). Will give dose of ketorolac to help with pain control, otherwise continue morphine/oxycodone/tramadol/Ativan as needed. Plan HCAP (healthcare-associated pneumonia)/Influenza type B  Completed course of Tamiflu  Completed course of Levaquin Acute DVT  Not anticoagulated because of fall risk  Continue Lovenox for prophylaxis Essential hypertension, benign/Chronic atrial fibrillation (HCC)/Hyponatremia/GERD (gastroesophageal reflux disease)/History of TIA (transient ischemic attack)/Carotid artery disease (HCC)  No acute changes  Continue current management Pressure ulcer  Stage II sacral decubitus ulcer, present on admission  Monitor, regular turns Hypothyroidism/Metabolic encephalopathy  Tsh 27, it was 17 on 02/06/2016 at the skilled nursing facility. Change Synthroid to intravenous until patient can take oral consistently. Code Status: Full Code Family Communication: daughter at bedside Disposition Plan: ?SNF some time next week, dependent on improvement in mentation.    Consultants:  Palliative  care  Procedures:    Antibiotics:  Tamiflu>>02/16/16  Levaquin>>02/17/16  HPI/Subjective: Moaning and groaning, not responding to questions.  Objective: Filed  Vitals:   02/18/16 0554 02/18/16 1500  BP: 138/104 128/74  Pulse: 138 94  Temp: 98.7 F (37.1 C) 98.4 F (36.9 C)  Resp: 21     Intake/Output Summary (Last 24 hours) at 02/18/16 1557 Last data filed at 02/17/16 2144  Gross per 24 hour  Intake      0 ml  Output    950 ml  Net   -950 ml   Filed Weights   02/11/16 2203  Weight: 57.607 kg (127 lb)    Exam:   General:  Moaning and groaning.  Cardiovascular: S1-S2 normal. No murmurs. Pulse regular.  Respiratory: Good air entry bilaterally. No rhonchi or rales.  Abdomen: Soft and nontender. Normal bowel sounds. No organomegaly.  Musculoskeletal: No pedal edema   Neurological: Disoriented. Moving all extremities.  Data Reviewed: Basic Metabolic Panel:  Recent Labs Lab 02/12/16 0145 02/13/16 0517 02/14/16 0434 02/16/16 0511 02/17/16 0525 02/18/16 0552  NA  --  132* 134* 134* 135 134*  K  --  3.2* 3.8 3.8 3.8 3.7  CL  --  100* 102 103 102 102  CO2  --  22 24 22 23  21*  GLUCOSE  --  112* 113* 89 115* 94  BUN  --  10 13 16  21* 20  CREATININE  --  0.48 0.35* 0.50 0.55 0.59  CALCIUM  --  7.6* 8.2* 8.3* 8.7* 8.5*  MG 2.2 2.0 2.2  --   --   --    Liver Function Tests:  Recent Labs Lab 02/11/16 1840  AST 64*  ALT 54  ALKPHOS 181*  BILITOT 1.3*  PROT 5.8*  ALBUMIN 3.2*   No results for input(s): LIPASE, AMYLASE in the last 168 hours. No results for input(s): AMMONIA in the last 168 hours. CBC:  Recent Labs Lab 02/11/16 1840 02/13/16 0517 02/14/16 0434 02/16/16 0511 02/17/16 0525 02/18/16 0552  WBC 10.1 7.0 6.1 6.2 6.7 6.9  NEUTROABS 8.4*  --   --   --   --   --   HGB 10.7* 10.1* 10.4* 11.4* 11.9* 11.7*  HCT 33.0* 32.2* 32.1* 35.8* 37.4 36.9  MCV 97.1 98.8 96.4 96.8 97.4 98.4  PLT 230 251 280 357 443* 434*   Cardiac  Enzymes:  Recent Labs Lab 02/11/16 2034  TROPONINI 0.04*   BNP (last 3 results)  Recent Labs  02/11/16 2034  BNP 396.7*    ProBNP (last 3 results) No results for input(s): PROBNP in the last 8760 hours.  CBG: No results for input(s): GLUCAP in the last 168 hours.  Recent Results (from the past 240 hour(s))  Blood Culture (routine x 2)     Status: None   Collection Time: 02/11/16  6:40 PM  Result Value Ref Range Status   Specimen Description BLOOD RIGHT ARM  Final   Special Requests BOTTLES DRAWN AEROBIC AND ANAEROBIC 5CC  Final   Culture   Final    NO GROWTH 5 DAYS Performed at [facility]    Report Status 02/17/2016 FINAL  Final  Blood Culture (routine x 2)     Status: None   Collection Time: 02/11/16  6:40 PM  Result Value Ref Range Status   Specimen Description RIGHT ANTECUBITAL  Final   Special Requests BOTTLES DRAWN AEROBIC AND ANAEROBIC 5CC  Final   Culture   Final    NO GROWTH 5 DAYS Performed at [facility]    Report Status 02/17/2016 FINAL  Final  Urine culture  Status: None   Collection Time: 02/11/16  7:52 PM  Result Value Ref Range Status   Specimen Description URINE, CATHETERIZED  Final   Special Requests NONE  Final   Culture   Final    NO GROWTH 1 DAY Performed at [facility]    Report Status 02/13/2016 FINAL  Final  MRSA PCR Screening     Status: None   Collection Time: 02/11/16 10:23 PM  Result Value Ref Range Status   MRSA by PCR NEGATIVE NEGATIVE Final    Comment:        The GeneXpert MRSA Assay (FDA approved for NASAL specimens only), is one component of a comprehensive MRSA colonization surveillance program. It is not intended to diagnose MRSA infection nor to guide or monitor treatment for MRSA infections.   Respiratory virus panel     Status: Abnormal   Collection Time: 02/11/16 11:02 PM  Result Value Ref Range Status   Respiratory Syncytial Virus A Negative Negative Final   Respiratory Syncytial  Virus B Negative Negative Final   Influenza A Negative Negative Final   Influenza B Positive (A) Negative Final   Parainfluenza 1 Negative Negative Final   Parainfluenza 2 Negative Negative Final   Parainfluenza 3 Negative Negative Final   Metapneumovirus Negative Negative Final   Rhinovirus Negative Negative Final   Adenovirus Negative Negative Final    Comment: (NOTE) Performed At: [facility] [place], [place] [id number] Lindon Romp MD [id number]      Studies: No results found.  Scheduled Meds: . enoxaparin (LOVENOX) injection  40 mg Subcutaneous QHS  . ketorolac  15 mg Intravenous Once  . levothyroxine  50 mcg Intravenous Daily  . metoprolol tartrate  25 mg Oral BID  . pantoprazole  40 mg Oral QHS   Continuous Infusions: . dextrose 5 % and 0.9 % NaCl with KCl 20 mEq/L       Time spent: 25 minutes    Charrise Lardner  [facility] Pager [phone]. If 7PM-7AM, please contact night-coverage at [email or web address], password [email or web address] 02/18/2016, 3:57 PM  LOS: 7 days

## 2016-02-18 NOTE — Consult Note (Signed)
Consultation Note Date: 02/18/2016   Patient Name: Jasmine Torres  DOB: 09/18/1925  MRN: [id number]  Age / Sex: 82 y.o., female  PCP: [name], MD Referring Physician: Nat Math, MD  Reason for Consultation: Establishing goals of care and Psychosocial/spiritual support    Clinical Assessment/Narrative: Patient is an 82 year old female who's had a complicated 4 week history with frequent hospitalizations. She was initially admitted after a fall at home where she sustained a nonoperative pelvic fracture. After being stabilized at [facility] she was discharged to [facility] for skilled days. Routine laboratory draws revealed a low hemoglobin and at that point she was dispositioned back to the hospital.She is now readmitted to [facility] on February 25 with lethargy and shortness of breath. She was found to be flu B+ as well as pneumonia. Her respiratory status has been improving she has however been refusing by mouth medicines and agitated depressed and tearful. Per chart review patient had a TSH drawn and that value is 27.887. I met with her daughter Deveron Furlong and reviewed the medical record and current clinical condition. Per her daughter a TSH was drawn at [facility] [place] [place] [facility] on 02/06/2016 and that value at the time was 17.8 . They adjusted her medication from 50 g Synthroid to 75 g. Spoke at length with her daughter about clinical decline particularly in patients where we see them having frequent hospitalizations falls and perhaps early onset of dementia. This is now complicated by severe hypothyroidism. Daughter seems to understand that despite treatment of hypothyroidism  that her mother may not return to her baseline and] return to her home. We did discuss other goals of care and she did elect to make her mother a  DO NOT RESUSCITATE  Contacts/Participants in Discussion: Primary Decision  Maker: Deveron Furlong area code [phone]   Relationship to Patient daughter HCPOA: yes   SUMMARY OF RECOMMENDATIONS At this point family would like to continue to treat the treatable Wishes to go forward with IV Synthroid to try and stabilize her hypothyroidism and ascertain what her new baseline will be We did discuss that she likely would be ready for discharge next week and to approach Jannifer Rodney again about readmission We generally discussed the concept of skilled days as a Medicare benefit as well as hospice She was informed how to access hospice wants her mother no longer met a skilled need  Code Status/Advance Care Planning: DNR    Code Status Orders        Start     Ordered   02/18/16 1439  Do not attempt resuscitation (DNR)   Continuous    Question Answer Comment  In the event of cardiac or respiratory ARREST Do not call a "code blue"   In the event of cardiac or respiratory ARREST Do not perform Intubation, CPR, defibrillation or ACLS   In the event of cardiac or respiratory ARREST Use medication by any route, position, wound care, and other measures to relive pain and suffering. May use oxygen, suction and manual treatment of airway obstruction as needed for comfort.      02/18/16 1438    Code Status History    Date Active Date Inactive Code Status Order ID Comments User Context   02/11/2016 10:02 PM 02/18/2016  2:38 PM Full Code [id number]  Ivor Costa, MD ED   01/18/2016 10:33 PM 01/21/2016  6:02 PM Full Code [id number]  Edwin Dada, MD ED   01/14/2016  4:58 PM 01/17/2016  5:28 PM Full Code [id number]  Venetia Maxon Rama, MD Inpatient      Other Directives:None  Symptom Management:   Pain: Discussed how unmanaged pain could be impeding her ability to participate in physical therapy as well as, contributing to agitation. Daughter agreed to low-dose opioids. Continue morphine 1 mg when necessary  Agitation: Continue with Ativan when necessary. Again support usage of  opioids as first line for agitation as this may be unmanaged pain with her pelvic fracture. I did share this with her daughter   Psycho-social/Spiritual:  Support System: Strong Desire for further Chaplaincy support:no Additional Recommendations: Grief/Bereavement Support  Prognosis: Her prognosis is difficult to ascertain at this point but I would not be surprised if it was 6 months or less and then she met hospice in facility criteria  Discharge Planning: SNF for rehab   Chief Complaint/ Primary Diagnoses: Present on Admission:  . Essential hypertension, benign . Hypothyroidism . Chronic atrial fibrillation ([place]) . Hyponatremia . GERD (gastroesophageal reflux disease) . Carotid artery disease ([place]) . (Resolved) Fever . HCAP (healthcare-associated pneumonia) . (Resolved) Sepsis ([place]) . Hypokalemia . Acute encephalopathy . Pressure ulcer . Influenza due to influenza virus, type B . DVT of lower limb, acute ([place])  I have reviewed the medical record, interviewed the patient and family, and examined the patient. The following aspects are pertinent.  Past Medical History  Diagnosis Date  . Atrial fibrillation [facility])     Patient declines anticoagulation  . Hypothyroidism   . GERD (gastroesophageal reflux disease)   . Anxiety   . Aortic valve disorders   . Essential hypertension, benign   . History of TIA (transient ischemic attack)   . 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 Pulmonary nodule     Stable 4 mm left upper lobe, benign  . Carotid artery disease ([place])     Less than 50% bilaterally 2/13  . Skin cancer   . Rectal bleeding 05/14/2013   Social History   Social History  . Marital Status: Widowed    Spouse Name: N/A  . Number of Children: N/A  . Years of Education: N/A   Occupational History  . RETIRED    Social History Main Topics  . Smoking status: Never Smoker   . Smokeless tobacco: Never Used  . Alcohol Use: No  .  Drug Use: No  . Sexual Activity: Not Asked   Other Topics Concern  . None   Social History Narrative   Pt lives alone. Daughter helps with care.   Family History  Problem Relation Age of Onset  . Diabetes Mellitus II Mother   . CAD Brother   . Breast cancer Sister    Scheduled Meds: . enoxaparin (LOVENOX) injection  40 mg Subcutaneous QHS  . ketorolac  15 mg Intravenous Once  . levothyroxine  50 mcg Intravenous Daily  . metoprolol tartrate  25 mg Oral BID  . pantoprazole  40 mg Oral QHS   Continuous Infusions: . dextrose 5 % and 0.9 % NaCl with KCl 20 mEq/L     PRN Meds:.acetaminophen, hydrALAZINE, levalbuterol, LORazepam, morphine injection, oxyCODONE, traMADol Medications Prior to Admission:  Prior to Admission medications   Medication Sig Start Date End Date Taking? Authorizing Provider  acetaminophen (TYLENOL) 325 MG tablet Take 2 tablets (650 mg total) by mouth every 6 (six) hours as needed for mild pain (or Fever >/= 101). 01/17/16  Yes Robbie Lis, MD  aspirin EC  81 MG tablet Take 1 tablet (81 mg total) by mouth daily. 01/27/16  Yes Charlynne Cousins, MD  Calcium-Magnesium-Vitamin D (CALCIUM MAGNESIUM PO) Take 1 tab by mouth every morning & 2 tabs at bedtime   Yes Historical Provider, MD  clopidogrel (PLAVIX) 75 MG tablet Take 1 tablet (75 mg total) by mouth daily. 01/21/16  Yes Charlynne Cousins, MD  levothyroxine (SYNTHROID, LEVOTHROID) 50 MCG tablet Take 50 mcg by mouth daily before breakfast.   Yes Historical Provider, MD  Omega-3 Fatty Acids (FISH OIL) 1200 MG CAPS Take 2 capsules by mouth every morning.    Yes Historical Provider, MD  pantoprazole (PROTONIX) 20 MG tablet Take 20 mg by mouth daily as needed for heartburn.   Yes Historical Provider, MD  traMADol (ULTRAM) 50 MG tablet Take 50 mg by mouth every 6 (six) hours as needed for moderate pain.   Yes Historical Provider, MD  HYDROcodone-acetaminophen (NORCO/VICODIN) 5-325 MG tablet Take 1 tablet by mouth every  4 (four) hours as needed for moderate pain. Patient not taking: Reported on 02/11/2016 01/21/16   Charlynne Cousins, MD  metoprolol tartrate (LOPRESSOR) 25 MG tablet Take 1 tablet (25 mg total) by mouth 2 (two) times daily. Patient not taking: Reported on 01/24/2016 01/21/16   Charlynne Cousins, MD  polyethylene glycol [facility] Floria Raveling) packet Take 17 g by mouth 2 (two) times daily. Patient not taking: Reported on 01/24/2016 01/21/16   Charlynne Cousins, MD   Allergies  Allergen Reactions  . Penicillins Other (See Comments)    Has patient had a PCN reaction causing immediate rash, facial/tongue/throat swelling, SOB or lightheadedness with hypotension: unknown Has patient had a PCN reaction causing severe rash involving mucus membranes or skin necrosis: unknown Has patient had a PCN reaction that required hospitalization unknown Has patient had a PCN reaction occurring within the last 10 years: no If all of the above answers are "NO", then may proceed with Cephalosporin use.     Review of Systems  Unable to perform ROS   Physical Exam  Constitutional:  cachetic  Cardiovascular:  irrg  Neurological:  somnalent  Skin: Skin is warm and dry.    Vital Signs: BP 128/74 mmHg  Pulse 94  Temp(Src) 98.4 F (36.9 C) (Axillary)  Resp 21  Ht '5\' 5"'$  (1.651 m)  Wt 57.607 kg (127 lb)  BMI 21.13 kg/m2  SpO2 96%  SpO2: SpO2: 96 % O2 Device:SpO2: 96 % O2 Flow Rate: .O2 Flow Rate (L/min): 2 L/min  IO: Intake/output summary:  Intake/Output Summary (Last 24 hours) at 02/18/16 1542 Last data filed at 02/17/16 2144  Gross per 24 hour  Intake      0 ml  Output    950 ml  Net   -950 ml    LBM: Last BM Date: 02/14/16 Baseline Weight: Weight: 57.607 kg (127 lb) Most recent weight: Weight: 57.607 kg (127 lb)      Palliative Assessment/Data:  Flowsheet Rows        Most Recent Value   Intake Tab    Referral Department  Hospitalist   Unit at Time of Referral  Med/Surg Unit   Palliative  Care Primary Diagnosis  Sepsis/Infectious Disease   Date Notified  02/17/16   Palliative Care Type  New Palliative care   Date of Admission  02/11/16   Date first seen by Palliative Care  02/18/16   # of days Palliative referral response time  1 Day(s)   # of days IP  prior to Palliative referral  6   Clinical Assessment    Palliative Performance Scale Score  20%   Pain Max last 24 hours  Not able to report   Pain Min Last 24 hours  Not able to report   Dyspnea Max Last 24 Hours  Not able to report   Dyspnea Min Last 24 hours  Not able to report   Nausea Max Last 24 Hours  Not able to report   Nausea Min Last 24 Hours  Not able to report   Anxiety Max Last 24 Hours  Not able to report   Anxiety Min Last 24 Hours  Not able to report   Other Max Last 24 Hours  Not able to report   Psychosocial & Spiritual Assessment    Palliative Care Outcomes    Patient/Family meeting held?  Yes   Palliative Care Outcomes  Completed durable DNR   Palliative Care follow-up planned  Yes, Facility      Additional Data Reviewed:  CBC:    Component Value Date/Time   WBC 6.9 02/18/2016 0552   HGB 11.7* 02/18/2016 0552   HCT 36.9 02/18/2016 0552   PLT 434* 02/18/2016 0552   MCV 98.4 02/18/2016 0552   NEUTROABS 8.4* 02/11/2016 1840   LYMPHSABS 0.9 02/11/2016 1840   MONOABS 0.8 02/11/2016 1840   EOSABS 0.0 02/11/2016 1840   BASOSABS 0.0 02/11/2016 1840   Comprehensive Metabolic Panel:    Component Value Date/Time   NA 134* 02/18/2016 0552   K 3.7 02/18/2016 0552   CL 102 02/18/2016 0552   CO2 21* 02/18/2016 0552   BUN 20 02/18/2016 0552   CREATININE 0.59 02/18/2016 0552   GLUCOSE 94 02/18/2016 0552   CALCIUM 8.5* 02/18/2016 0552   AST 64* 02/11/2016 1840   ALT 54 02/11/2016 1840   ALKPHOS 181* 02/11/2016 1840   BILITOT 1.3* 02/11/2016 1840   PROT 5.8* 02/11/2016 1840   ALBUMIN 3.2* 02/11/2016 1840     Time In: 1300 Time Out: 1415 Time Total: 75 min  Greater than 50%  of this time  was spent counseling and coordinating care related to the above assessment and plan.  Staffed with Dr. Sanjuana Letters  Signed by: Dory Horn, NP  Dory Horn, NP  02/18/2016, 3:42 PM  Please contact Palliative Medicine Team phone at [phone] for questions and concerns.

## 2016-02-19 LAB — COMPREHENSIVE METABOLIC PANEL
ALT: 27 U/L (ref 14–54)
ANION GAP: 11 (ref 5–15)
AST: 28 U/L (ref 15–41)
Albumin: 3.4 g/dL — ABNORMAL LOW (ref 3.5–5.0)
Alkaline Phosphatase: 158 U/L — ABNORMAL HIGH (ref 38–126)
BILIRUBIN TOTAL: 1.2 mg/dL (ref 0.3–1.2)
BUN: 18 mg/dL (ref 6–20)
CHLORIDE: 106 mmol/L (ref 101–111)
CO2: 23 mmol/L (ref 22–32)
Calcium: 8.5 mg/dL — ABNORMAL LOW (ref 8.9–10.3)
Creatinine, Ser: 0.64 mg/dL (ref 0.44–1.00)
GLUCOSE: 122 mg/dL — AB (ref 65–99)
POTASSIUM: 4.3 mmol/L (ref 3.5–5.1)
Sodium: 140 mmol/L (ref 135–145)
TOTAL PROTEIN: 6.3 g/dL — AB (ref 6.5–8.1)

## 2016-02-19 LAB — CBC
HCT: 38.3 % (ref 36.0–46.0)
HEMOGLOBIN: 12 g/dL (ref 12.0–15.0)
MCH: 31 pg (ref 26.0–34.0)
MCHC: 31.3 g/dL (ref 30.0–36.0)
MCV: 99 fL (ref 78.0–100.0)
Platelets: 433 10*3/uL — ABNORMAL HIGH (ref 150–400)
RBC: 3.87 MIL/uL (ref 3.87–5.11)
RDW: 17.4 % — ABNORMAL HIGH (ref 11.5–15.5)
WBC: 8.2 10*3/uL (ref 4.0–10.5)

## 2016-02-19 MED ORDER — MORPHINE SULFATE (PF) 2 MG/ML IV SOLN
1.0000 mg | INTRAVENOUS | Status: DC | PRN
  Administered 2016-02-19 – 2016-02-23 (×18): 1 mg via INTRAVENOUS
  Filled 2016-02-19 (×18): qty 1

## 2016-02-19 NOTE — Progress Notes (Signed)
Daily Progress Note   Patient Name: Jasmine Torres       Date: 02/19/2016 DOB: 1925/03/24  Age: 82 y.o. MRN#: [id number] Attending Physician: Nat Math, MD Primary Care Physician: [name], MD Admit Date: 02/11/2016  Reason for Consultation/Follow-up: Establishing goals of care and Pain control  Subjective: Patient has received IV Synthroid for 2 doses. Her clinical condition overnight is unchanged. Daughter still struggling with her mother's decline, and allowing her to have pain medicine. I did stress to her and she does understand that her mother does have pain that at this point she is not attempting to go through rehabilitation, that when awake she is agitated, assured her that the dosages were low and to please allow her to receive injectable morphine when she sees her becoming tearful and moaning. Daughter is struggling between feeling like she is doing everything for her mother but also she feels that she is suffering after all she's been through these past 6 weeks. The patient is not eating and is noted when awake is tearful and agitated. I did share with her at this continues the way it is with no by mouth intake, not responding she may possibly meet inpatient hospice criteria for end-of-life care. Daughter was tearful but stated she also feels that "I think she's had enough". Interval Events: No clinical improvement despite IV Synthroid Length of Stay: 8 days  Current Medications: Scheduled Meds:  . enoxaparin (LOVENOX) injection  40 mg Subcutaneous QHS  . levothyroxine  50 mcg Intravenous Daily  . metoprolol tartrate  25 mg Oral BID  . pantoprazole  40 mg Oral QHS    Continuous Infusions: . dextrose 5 % and 0.9 % NaCl with KCl 20 mEq/L 40 mL/hr at 02/18/16 1623    PRN  Meds: acetaminophen, hydrALAZINE, levalbuterol, LORazepam, morphine injection, oxyCODONE, traMADol  Physical Exam: Physical Exam  Constitutional:  Frail, cachetic, appears acutely ill  HENT:  Head: Normocephalic.  Neck:  Neck hperextended  Pulmonary/Chest: Effort normal.  Neurological:  When awake is moaning, grimacing  Skin: Skin is warm and dry.  Psychiatric:  Anxious, agitated                Vital Signs: BP 163/81 mmHg  Pulse 99  Temp(Src) 97.5 F (36.4 C) (Axillary)  Resp 18  Ht 5\' 5"  (1.651  m)  Wt 57.607 kg (127 lb)  BMI 21.13 kg/m2  SpO2 96% SpO2: SpO2: 96 % O2 Device: O2 Device: Not Delivered O2 Flow Rate: O2 Flow Rate (L/min): 2 L/min  Intake/output summary:  Intake/Output Summary (Last 24 hours) at 02/19/16 1142 Last data filed at 02/19/16 0600  Gross per 24 hour  Intake 604.67 ml  Output      0 ml  Net 604.67 ml   LBM: Last BM Date: 02/14/16 Baseline Weight: Weight: 57.607 kg (127 lb) Most recent weight: Weight: 57.607 kg (127 lb)       Palliative Assessment/Data: Flowsheet Rows        Most Recent Value   Intake Tab    Referral Department  Hospitalist   Unit at Time of Referral  Med/Surg Unit   Palliative Care Primary Diagnosis  Sepsis/Infectious Disease   Date Notified  02/17/16   Palliative Care Type  New Palliative care   Date of Admission  02/11/16   Date first seen by Palliative Care  02/18/16   # of days Palliative referral response time  1 Day(s)   # of days IP prior to Palliative referral  6   Clinical Assessment    Palliative Performance Scale Score  20%   Pain Max last 24 hours  Not able to report   Pain Min Last 24 hours  Not able to report   Dyspnea Max Last 24 Hours  Not able to report   Dyspnea Min Last 24 hours  Not able to report   Nausea Max Last 24 Hours  Not able to report   Nausea Min Last 24 Hours  Not able to report   Anxiety Max Last 24 Hours  Not able to report   Anxiety Min Last 24 Hours  Not able to report   Other  Max Last 24 Hours  Not able to report   Psychosocial & Spiritual Assessment    Palliative Care Outcomes    Patient/Family meeting held?  Yes   Palliative Care Outcomes  Completed durable DNR   Palliative Care follow-up planned  Yes, Facility      Additional Data Reviewed: CBC    Component Value Date/Time   WBC 8.2 02/19/2016 0555   RBC 3.87 02/19/2016 0555   HGB 12.0 02/19/2016 0555   HCT 38.3 02/19/2016 0555   PLT 433* 02/19/2016 0555   MCV 99.0 02/19/2016 0555   MCH 31.0 02/19/2016 0555   MCHC 31.3 02/19/2016 0555   RDW 17.4* 02/19/2016 0555   LYMPHSABS 0.9 02/11/2016 1840   MONOABS 0.8 02/11/2016 1840   EOSABS 0.0 02/11/2016 1840   BASOSABS 0.0 02/11/2016 1840    CMP     Component Value Date/Time   NA 140 02/19/2016 0555   K 4.3 02/19/2016 0555   CL 106 02/19/2016 0555   CO2 23 02/19/2016 0555   GLUCOSE 122* 02/19/2016 0555   BUN 18 02/19/2016 0555   CREATININE 0.64 02/19/2016 0555   CALCIUM 8.5* 02/19/2016 0555   PROT 6.3* 02/19/2016 0555   ALBUMIN 3.4* 02/19/2016 0555   AST 28 02/19/2016 0555   ALT 27 02/19/2016 0555   ALKPHOS 158* 02/19/2016 0555   BILITOT 1.2 02/19/2016 0555   GFRNONAA >60 02/19/2016 0555   GFRAA >60 02/19/2016 0555       Problem List:  Patient Active Problem List   Diagnosis Date Noted  . Palliative care encounter   . Influenza due to influenza virus, type B 02/15/2016  . DVT of lower  limb, acute ([place]) 02/15/2016  . Pressure ulcer 02/13/2016  . Acute encephalopathy 02/12/2016  . HCAP (healthcare-associated pneumonia) 02/11/2016  . Hypokalemia 02/11/2016  . GERD (gastroesophageal reflux disease)   . History of TIA (transient ischemic attack)   . Carotid artery disease ([place])   . Pelvic hematoma, female 01/18/2016  . Acute blood loss anemia 01/18/2016  . Thrombocytopenia ([place]) 01/18/2016  . Hyponatremia 01/18/2016  . Bilateral pubic rami fractures ([place]) 01/14/2016  . Precordial pain 04/15/2014  . Chronic atrial fibrillation  ([place]) 06/03/2013  . Hypothyroidism 03/26/2012  . Essential hypertension, benign 03/27/2010  . HYPERLIPIDEMIA-MIXED 10/03/2009     Palliative Care Assessment & Plan    1.Code Status:  DNR    Code Status Orders        Start     Ordered   02/18/16 1439  Do not attempt resuscitation (DNR)   Continuous    Question Answer Comment  In the event of cardiac or respiratory ARREST Do not call a "code blue"   In the event of cardiac or respiratory ARREST Do not perform Intubation, CPR, defibrillation or ACLS   In the event of cardiac or respiratory ARREST Use medication by any route, position, wound care, and other measures to relive pain and suffering. May use oxygen, suction and manual treatment of airway obstruction as needed for comfort.      02/18/16 1438    Code Status History    Date Active Date Inactive Code Status Order ID Comments User Context   02/11/2016 10:02 PM 02/18/2016  2:38 PM Full Code [id number]  Ivor Costa, MD ED   01/18/2016 10:33 PM 01/21/2016  6:02 PM Full Code [id number]  Edwin Dada, MD ED   01/14/2016  4:58 PM 01/17/2016  5:28 PM Full Code [id number]  Venetia Maxon Rama, MD Inpatient       2. Goals of Care/Additional Recommendations:  Daughter not quite ready for full comfort measures but is trending in that direction. We did discuss that probably this week her clinical condition may be clear to where she can make a decision about returning to skilled nursing for rehabilitation versus inpatient hospice for end-of-life care  Limitations on Scope of Treatment: Full Scope Treatment except for DO NOT RESUSCITATE   Desire for further Chaplaincy support:no  Psycho-social Needs: Grief/Bereavement Support  3. Symptom Management      Pain: Unmanaged. Continue to work with the daughter to allow more frequent injectable morphine for pain related to pelvic fracture. She has other by mouth pain medicine options but patient at this point is unable to take anything by  mouth Agitation: Feel that this is primarily unmanaged pain. Encouraged daughter to let us try morphine 1 mg IV as first line before Ativan  4. Palliative Prophylaxis:   Bowel Regimen, Delirium Protocol, Frequent Pain Assessment and Turn Reposition  Prognosis: If things continue way they are now, with patient not eating not able to exhibit awaking,, her prognosis could be a matter of weeks. If this remains the case feel that she would qualify for inpatient hospice with the potential prognosis of days to weeks   6. Discharge Planning:  Still undecided whether patient's clinical condition will improve enough that she can return to the nursing home and attempt rehabilitation versus inpatient hospice   Thank you for allowing the Palliative Medicine Team to assist in the care of this patient.   Time In: 1100 Time Out: 1125 Total Time 25 min Prolonged Time Billed  no  Dory Horn, NP  02/19/2016, 11:42 AM  Please contact Palliative Medicine Team phone at [phone] for questions and concerns.

## 2016-02-19 NOTE — Progress Notes (Signed)
[facility] PROGRESS NOTE  RAFEEF KORNBLUTH [id number] DOB: Jan 14, 1925 DOA: 02/11/2016 PCP: [name], MD  Summary 02/15/16: I have seen and examined [name] at bedside in the presence of her daughter and reviewed her chart. Patient has had a complicated last 4 weeks. Initially admitted to hospital after fall for non-operative hip fx. Went to rehab only to return a few days later for low Hgb. She then went back to rehab and returned for hyponatremia. Patient now presents to the hospital with AMS, lethargy and SOB. She was found to be flu B positive here. There was also mention of a pneumonia which is being treated. Patient was also found to have a distal DVT. Recommendation during a previous admission was no anticoagulation (ASA/plavix) until at least 3/3. She remains somewhat disoriented today and complains of hip/chest pain. Her daughter prefers less strong pain medications as she does not want her mom to be "doped". Will continue Tamiflu to complete course of treatment for influenza but transition antibiotics to Levaquin oral as septic workup remains otherwise negative. Will also add tramadol which should not affect the patient's mentation. 02/16/16: Patient more with it today. Less Pain. Will continue tamiflu, levaquin. Likely d/c tomorrow. 02/17/16: Patient continues to have confusion. Had long discussion with patient's daughter and son-in-law at the bedside and they would like to explore hospice enrollment/help with goals of care as patient's sensorium remains clouded and she appears to be in discomfort. This is despite course of antibiotics and minimal psychoaffective medications. It appears that patient has undergone a steep decline in her clinical condition since end of January. She appears to be in discomfort this evening, refusing oral medications. We'll therefore consult palliative care for goals of care assessment and consideration for hospice enrollment. Meanwhile, will check  TSH/vitamin B 12 level/RPR as there is question of dementia, hence evaluate for treatable causes. It is possible that the acute event end of January unmasked mild dementia and patient is not recovering significantly despite acute interventions for different acute illnesses since then. 02/18/16: Appreciate palliative care. Patient has high TSH level of 27 which is in keeping with prior value of 17(around February [facility]th at [facility]). Obviously, uncontrolled hypothyroidism is part of the current complex of presentations including the confusion(?treatable cause of demntia). Patient appears to be in pain still. The balance between pain control and a clear sensorium iis one that has to be Delicately managed. It will be hard to know if IV Synthroid is having an impact(we will give synthroid iv as patient not taking much by mouth). Will give dose of ketorolac to help with pain control, otherwise continue morphine/oxycodone/tramadol/Ativan as needed. 02/19/16: Appreciate palliative care. Patient remains disoriented and seems to be in pain. Daughter agreeable to giving morphine as needed. I'm not sure how she will do even if she receives a few days of intravenous Synthroid, therefore will have to decide how to proceed if no change in the next couple of days. Plan HCAP (healthcare-associated pneumonia)/Influenza type B  Completed course of Tamiflu  Completed course of Levaquin Acute DVT/recent pelvic fracture  Not anticoagulated because of fall risk  Continue Lovenox for prophylaxis  Pain control as possible Essential hypertension, benign/Chronic atrial fibrillation (HCC)/Hyponatremia/GERD (gastroesophageal reflux disease)/History of TIA (transient ischemic attack)/Carotid artery disease (HCC)  No acute changes  Continue current management Pressure ulcer  Stage II sacral decubitus ulcer, present on admission  Monitor, regular turns Hypothyroidism/Metabolic encephalopathy  Tsh 27, it was 17 on 02/06/2016 at  the skilled nursing facility. Continue  Synthroid intravenous until patient can take oral consistently. Code Status: Full Code Family Communication: daughter and son in law at bedside Disposition Plan: ?SNF some time this week, dependent on improvement in mentation.    Consultants:  Palliative care  Procedures:    Antibiotics:  Tamiflu>>02/16/16  Levaquin>>02/17/16  HPI/Subjective: Not responding to questions  Objective: Filed Vitals:   02/18/16 2208 02/19/16 0535  BP: 153/73 163/81  Pulse: 91 99  Temp: 97.1 F (36.2 C) 97.5 F (36.4 C)  Resp: 20 18    Intake/Output Summary (Last 24 hours) at 02/19/16 1818 Last data filed at 02/19/16 0600  Gross per 24 hour  Intake 604.67 ml  Output      0 ml  Net 604.67 ml   Filed Weights   02/11/16 2203  Weight: 57.607 kg (127 lb)    Exam:   General:  Moaning and groaning.  Cardiovascular: S1-S2 normal. No murmurs. Pulse regular.  Respiratory: Good air entry bilaterally. No rhonchi or rales.  Abdomen: Soft and nontender. Normal bowel sounds. No organomegaly.  Musculoskeletal: No pedal edema   Neurological: Disoriented.  Data Reviewed: Basic Metabolic Panel:  Recent Labs Lab 02/13/16 0517 02/14/16 0434 02/16/16 0511 02/17/16 0525 02/18/16 0552 02/19/16 0555  NA 132* 134* 134* 135 134* 140  K 3.2* 3.8 3.8 3.8 3.7 4.3  CL 100* 102 103 102 102 106  CO2 22 24 22 23  21* 23  GLUCOSE 112* 113* 89 115* 94 122*  BUN 10 13 16  21* 20 18  CREATININE 0.48 0.35* 0.50 0.55 0.59 0.64  CALCIUM 7.6* 8.2* 8.3* 8.7* 8.5* 8.5*  MG 2.0 2.2  --   --   --   --    Liver Function Tests:  Recent Labs Lab 02/19/16 0555  AST 28  ALT 27  ALKPHOS 158*  BILITOT 1.2  PROT 6.3*  ALBUMIN 3.4*   No results for input(s): LIPASE, AMYLASE in the last 168 hours. No results for input(s): AMMONIA in the last 168 hours. CBC:  Recent Labs Lab 02/14/16 0434 02/16/16 0511 02/17/16 0525 02/18/16 0552 02/19/16 0555  WBC 6.1 6.2  6.7 6.9 8.2  HGB 10.4* 11.4* 11.9* 11.7* 12.0  HCT 32.1* 35.8* 37.4 36.9 38.3  MCV 96.4 96.8 97.4 98.4 99.0  PLT 280 357 443* 434* 433*   Cardiac Enzymes: No results for input(s): CKTOTAL, CKMB, CKMBINDEX, TROPONINI in the last 168 hours. BNP (last 3 results)  Recent Labs  02/11/16 2034  BNP 396.7*    ProBNP (last 3 results) No results for input(s): PROBNP in the last 8760 hours.  CBG: No results for input(s): GLUCAP in the last 168 hours.  Recent Results (from the past 240 hour(s))  Blood Culture (routine x 2)     Status: None   Collection Time: 02/11/16  6:40 PM  Result Value Ref Range Status   Specimen Description BLOOD RIGHT ARM  Final   Special Requests BOTTLES DRAWN AEROBIC AND ANAEROBIC 5CC  Final   Culture   Final    NO GROWTH 5 DAYS Performed at [facility]    Report Status 02/17/2016 FINAL  Final  Blood Culture (routine x 2)     Status: None   Collection Time: 02/11/16  6:40 PM  Result Value Ref Range Status   Specimen Description RIGHT ANTECUBITAL  Final   Special Requests BOTTLES DRAWN AEROBIC AND ANAEROBIC 5CC  Final   Culture   Final    NO GROWTH 5 DAYS Performed at [facility]  Report Status 02/17/2016 FINAL  Final  Urine culture     Status: None   Collection Time: 02/11/16  7:52 PM  Result Value Ref Range Status   Specimen Description URINE, CATHETERIZED  Final   Special Requests NONE  Final   Culture   Final    NO GROWTH 1 DAY Performed at [facility]    Report Status 02/13/2016 FINAL  Final  MRSA PCR Screening     Status: None   Collection Time: 02/11/16 10:23 PM  Result Value Ref Range Status   MRSA by PCR NEGATIVE NEGATIVE Final    Comment:        The GeneXpert MRSA Assay (FDA approved for NASAL specimens only), is one component of a comprehensive MRSA colonization surveillance program. It is not intended to diagnose MRSA infection nor to guide or monitor treatment for MRSA infections.   Respiratory  virus panel     Status: Abnormal   Collection Time: 02/11/16 11:02 PM  Result Value Ref Range Status   Respiratory Syncytial Virus A Negative Negative Final   Respiratory Syncytial Virus B Negative Negative Final   Influenza A Negative Negative Final   Influenza B Positive (A) Negative Final   Parainfluenza 1 Negative Negative Final   Parainfluenza 2 Negative Negative Final   Parainfluenza 3 Negative Negative Final   Metapneumovirus Negative Negative Final   Rhinovirus Negative Negative Final   Adenovirus Negative Negative Final    Comment: (NOTE) Performed At: [facility] [place], [place] [id number] Lindon Romp MD [id number]      Studies: No results found.  Scheduled Meds: . enoxaparin (LOVENOX) injection  40 mg Subcutaneous QHS  . levothyroxine  50 mcg Intravenous Daily  . metoprolol tartrate  25 mg Oral BID  . pantoprazole  40 mg Oral QHS   Continuous Infusions: . dextrose 5 % and 0.9 % NaCl with KCl 20 mEq/L 40 mL/hr at 02/18/16 1623     Time spent: 25 minutes    Caelynn Marshman  [facility] Pager [phone]. If 7PM-7AM, please contact night-coverage at [email or web address], password [facility] 02/19/2016, 6:18 PM  LOS: 8 days

## 2016-02-20 DIAGNOSIS — R52 Pain, unspecified: Secondary | ICD-10-CM

## 2016-02-20 LAB — BASIC METABOLIC PANEL
Anion gap: 6 (ref 5–15)
BUN: 14 mg/dL (ref 6–20)
CHLORIDE: 110 mmol/L (ref 101–111)
CO2: 21 mmol/L — ABNORMAL LOW (ref 22–32)
CREATININE: 0.44 mg/dL (ref 0.44–1.00)
Calcium: 7.9 mg/dL — ABNORMAL LOW (ref 8.9–10.3)
GFR calc Af Amer: >60 mL/min (ref >60)
GFR calc non Af Amer: >60 mL/min (ref >60)
GLUCOSE: 119 mg/dL — AB (ref 65–99)
POTASSIUM: 3.6 mmol/L (ref 3.5–5.1)
SODIUM: 137 mmol/L (ref 135–145)

## 2016-02-20 LAB — CBC
HCT: 34.7 % — ABNORMAL LOW (ref 36.0–46.0)
HEMOGLOBIN: 11.2 g/dL — AB (ref 12.0–15.0)
MCH: 31.5 pg (ref 26.0–34.0)
MCHC: 32.3 g/dL (ref 30.0–36.0)
MCV: 97.7 fL (ref 78.0–100.0)
PLATELETS: 342 10*3/uL (ref 150–400)
RBC: 3.55 MIL/uL — ABNORMAL LOW (ref 3.87–5.11)
RDW: 17.5 % — ABNORMAL HIGH (ref 11.5–15.5)
WBC: 8.2 10*3/uL (ref 4.0–10.5)

## 2016-02-20 LAB — RPR: RPR Ser Ql: NONREACTIVE

## 2016-02-20 MED ORDER — KCL IN DEXTROSE-NACL 20-5-0.9 MEQ/L-%-% IV SOLN
INTRAVENOUS | Status: AC
  Administered 2016-02-20: 17:00:00 via INTRAVENOUS
  Filled 2016-02-20 (×2): qty 1000

## 2016-02-20 MED ORDER — LORAZEPAM 2 MG/ML IJ SOLN
0.5000 mg | INTRAMUSCULAR | Status: DC | PRN
  Administered 2016-02-20 – 2016-02-22 (×8): 0.5 mg via INTRAVENOUS
  Filled 2016-02-20 (×8): qty 1

## 2016-02-20 NOTE — Progress Notes (Signed)
Patient's husband at bedside and called RN because patient is moaning more than before and appears to be very uncomfortable.  Husband asked frequency of morphine and ativan and requested RN to page MD to increase frequency of the Ativan.  Paged MD. New orders received.  Pain medication given.  Will continue to monitor.

## 2016-02-20 NOTE — Care Management Important Message (Signed)
Important Message  Patient Details IM Letter given to [name]/Case Manager to present to Patient Name: Jasmine Torres MRN: [id number] Date of Birth: 07-05-1925   Medicare Important Message Given:  Yes    Camillo Flaming 02/20/2016, 3:40 [place] Message  Patient Details  Name: Jasmine Torres MRN: [id number] Date of Birth: 04/01/1925   Medicare Important Message Given:  Yes    Camillo Flaming 02/20/2016, 3:40 PM

## 2016-02-20 NOTE — Progress Notes (Signed)
[facility] PROGRESS NOTE  Jasmine Torres [id number] DOB: January 31, 1925 DOA: 02/11/2016 PCP: [name], MD  Summary 02/15/16: I have seen and examined Jasmine Torres at bedside in the presence of her daughter and reviewed her chart. Patient has had a complicated last 4 weeks. Initially admitted to hospital after fall for non-operative hip fx. Went to rehab only to return a few days later for low Hgb. She then went back to rehab and returned for hyponatremia. Patient now presents to the hospital with AMS, lethargy and SOB. She was found to be flu B positive here. There was also mention of a pneumonia which is being treated. Patient was also found to have a distal DVT. Recommendation during a previous admission was no anticoagulation (ASA/plavix) until at least 3/3. She remains somewhat disoriented today and complains of hip/chest pain. Her daughter prefers less strong pain medications as she does not want her mom to be "doped". Will continue Tamiflu to complete course of treatment for influenza but transition antibiotics to Levaquin oral as septic workup remains otherwise negative. Will also add tramadol which should not affect the patient's mentation. 02/16/16: Patient more with it today. Less Pain. Will continue tamiflu, levaquin. Likely d/c tomorrow. 02/17/16: Patient continues to have confusion. Had long discussion with patient's daughter and son-in-law at the bedside and they would like to explore hospice enrollment/help with goals of care as patient's sensorium remains clouded and she appears to be in discomfort. This is despite course of antibiotics and minimal psychoaffective medications. It appears that patient has undergone a steep decline in her clinical condition since end of January. She appears to be in discomfort this evening, refusing oral medications. We'll therefore consult palliative care for goals of care assessment and consideration for hospice enrollment. Meanwhile, will check  TSH/vitamin B 12 level/RPR as there is question of dementia, hence evaluate for treatable causes. It is possible that the acute event end of January unmasked mild dementia and patient is not recovering significantly despite acute interventions for different acute illnesses since then. 02/18/16: Appreciate palliative care. Patient has high TSH level of 27 which is in keeping with prior value of 17(around February 20th at [facility]). Obviously, uncontrolled hypothyroidism is part of the current complex of presentations including the confusion(?treatable cause of demntia). Patient appears to be in pain still. The balance between pain control and a clear sensorium iis one that has to be Delicately managed. It will be hard to know if IV Synthroid is having an impact(we will give synthroid iv as patient not taking much by mouth). Will give dose of ketorolac to help with pain control, otherwise continue morphine/oxycodone/tramadol/Ativan as needed. 02/19/16: Appreciate palliative care. Patient remains disoriented and seems to be in pain. Daughter agreeable to giving morphine as needed. I'm not sure how she will do even if she receives a few days of intravenous Synthroid, therefore will have to decide how to proceed if no change in the next couple of days. 02/20/16: Appreciate palliative care. Patient remains mostly out of it. Daughter still holds hope that patient might recover with Synthroid. She would like to see if patient would get better in the next couple of days. However, patient continues to moan and groan with minimal movement. Daughter agrees that patient should get pain medications if she needs them. We'll therefore continue Synthroid and continue supportive care. Follow palliative care recommendations. Plan HCAP (healthcare-associated pneumonia)/Influenza type B  Completed course of Tamiflu  Completed course of Levaquin Acute DVT/recent pelvic fracture  Not anticoagulated because  of fall risk  Continue  Lovenox for prophylaxis  Pain control as possible Essential hypertension, benign/Chronic atrial fibrillation (HCC)/Hyponatremia/GERD (gastroesophageal reflux disease)/History of TIA (transient ischemic attack)/Carotid artery disease (HCC)  No acute changes  Continue current management Pressure ulcer  Stage II sacral decubitus ulcer, present on admission  Monitor, regular turns Hypothyroidism/Metabolic encephalopathy  Tsh 27, it was 17 on 02/06/2016 at the skilled nursing facility. Continue Synthroid intravenous until patient can take oral consistently. Code Status: DNR/DNI Family Communication: daughter  at bedside Disposition Plan: TBD depending on goals of care.    Consultants:  Palliative care  Procedures:    Antibiotics:  Tamiflu>>02/16/16  Levaquin>>02/17/16  HPI/Subjective: Unintelligible responses to questions.  Objective: Filed Vitals:   02/20/16 0700 02/20/16 1422  BP:  130/86  Pulse: 72 80  Temp:  98.9 F (37.2 C)  Resp:  20    Intake/Output Summary (Last 24 hours) at 02/20/16 2005 Last data filed at 02/20/16 1900  Gross per 24 hour  Intake   1480 ml  Output      0 ml  Net   1480 ml   Filed Weights   02/11/16 2203  Weight: 57.607 kg (127 lb)    Exam:   General:  Comfortable at rest.  Cardiovascular: S1-S2 normal. No murmurs. Pulse regular.  Respiratory: Good air entry bilaterally. No rhonchi or rales.  Abdomen: Soft and nontender. Normal bowel sounds. No organomegaly.  Musculoskeletal: No pedal edema   Neurological: Somnolent.  Data Reviewed: Basic Metabolic Panel:  Recent Labs Lab 02/14/16 0434 02/16/16 0511 02/17/16 0525 02/18/16 0552 02/19/16 0555 02/20/16 0545  NA 134* 134* 135 134* 140 137  K 3.8 3.8 3.8 3.7 4.3 3.6  CL 102 103 102 102 106 110  CO2 24 22 23  21* 23 21*  GLUCOSE 113* 89 115* 94 122* 119*  BUN 13 16 21* 20 18 14   CREATININE 0.35* 0.50 0.55 0.59 0.64 0.44  CALCIUM 8.2* 8.3* 8.7* 8.5* 8.5* 7.9*  MG  2.2  --   --   --   --   --    Liver Function Tests:  Recent Labs Lab 02/19/16 0555  AST 28  ALT 27  ALKPHOS 158*  BILITOT 1.2  PROT 6.3*  ALBUMIN 3.4*   No results for input(s): LIPASE, AMYLASE in the last 168 hours. No results for input(s): AMMONIA in the last 168 hours. CBC:  Recent Labs Lab 02/16/16 0511 02/17/16 0525 02/18/16 0552 02/19/16 0555 02/20/16 0545  WBC 6.2 6.7 6.9 8.2 8.2  HGB 11.4* 11.9* 11.7* 12.0 11.2*  HCT 35.8* 37.4 36.9 38.3 34.7*  MCV 96.8 97.4 98.4 99.0 97.7  PLT 357 443* 434* 433* 342   Cardiac Enzymes: No results for input(s): CKTOTAL, CKMB, CKMBINDEX, TROPONINI in the last 168 hours. BNP (last 3 results)  Recent Labs  02/11/16 2034  BNP 396.7*    ProBNP (last 3 results) No results for input(s): PROBNP in the last 8760 hours.  CBG: No results for input(s): GLUCAP in the last 168 hours.  Recent Results (from the past 240 hour(s))  Blood Culture (routine x 2)     Status: None   Collection Time: 02/11/16  6:40 PM  Result Value Ref Range Status   Specimen Description BLOOD RIGHT ARM  Final   Special Requests BOTTLES DRAWN AEROBIC AND ANAEROBIC 5CC  Final   Culture   Final    NO GROWTH 5 DAYS Performed at [facility]    Report Status 02/17/2016 FINAL  Final  Blood Culture (routine x 2)     Status: None   Collection Time: 02/11/16  6:40 PM  Result Value Ref Range Status   Specimen Description RIGHT ANTECUBITAL  Final   Special Requests BOTTLES DRAWN AEROBIC AND ANAEROBIC 5CC  Final   Culture   Final    NO GROWTH 5 DAYS Performed at [facility]    Report Status 02/17/2016 FINAL  Final  Urine culture     Status: None   Collection Time: 02/11/16  7:52 PM  Result Value Ref Range Status   Specimen Description URINE, CATHETERIZED  Final   Special Requests NONE  Final   Culture   Final    NO GROWTH 1 DAY Performed at [facility]    Report Status 02/13/2016 FINAL  Final  MRSA PCR Screening     Status:  None   Collection Time: 02/11/16 10:23 PM  Result Value Ref Range Status   MRSA by PCR NEGATIVE NEGATIVE Final    Comment:        The GeneXpert MRSA Assay (FDA approved for NASAL specimens only), is one component of a comprehensive MRSA colonization surveillance program. It is not intended to diagnose MRSA infection nor to guide or monitor treatment for MRSA infections.   Respiratory virus panel     Status: Abnormal   Collection Time: 02/11/16 11:02 PM  Result Value Ref Range Status   Respiratory Syncytial Virus A Negative Negative Final   Respiratory Syncytial Virus B Negative Negative Final   Influenza A Negative Negative Final   Influenza B Positive (A) Negative Final   Parainfluenza 1 Negative Negative Final   Parainfluenza 2 Negative Negative Final   Parainfluenza 3 Negative Negative Final   Metapneumovirus Negative Negative Final   Rhinovirus Negative Negative Final   Adenovirus Negative Negative Final    Comment: (NOTE) Performed At: [facility] [place], [place] [id number] Lindon Romp MD [id number]      Studies: No results found.  Scheduled Meds: . enoxaparin (LOVENOX) injection  40 mg Subcutaneous QHS  . levothyroxine  50 mcg Intravenous Daily  . metoprolol tartrate  25 mg Oral BID  . pantoprazole  40 mg Oral QHS   Continuous Infusions: . dextrose 5 % and 0.9 % NaCl with KCl 20 mEq/L 40 mL/hr at 02/20/16 1724     Time spent: 25 minutes    Emannuel Vise  [facility] Pager [phone]. If 7PM-7AM, please contact night-coverage at [email or web address], password [facility] 02/20/2016, 8:05 PM  LOS: 9 days

## 2016-02-20 NOTE — Progress Notes (Signed)
Daily Progress Note   Patient Name: Jasmine Torres       Date: 02/20/2016 DOB: 1925-02-14  Age: 82 y.o. MRN#: [id number] Attending Physician: Nat Math, MD Primary Care Physician: [name], MD Admit Date: 02/11/2016  Reason for Consultation/Follow-up: Establishing goals of care and Pain control  Subjective:  Continued conversation with daughter/[name] at patient's bedside.    Patient is not taking any po intake, she is easily agitated with repositioning   We discussed the concept of overall failure to thrive and the natural trajectory at EOL.  Continued discussion regarding symptom management specific to pain and utilization of medication to enhance comfort.    Jasmine Torres understands the overall poor prognosis but struggles with any decision to de-esclate care and "not give her every chance".   " I wish God would make this decision"  We discussed the fact  that the patient herself had shared with her daughter that if she could not be independent and self sufficient that would not be considered quality of life to her   Length of Stay: 9 days  Current Medications: Scheduled Meds:  . enoxaparin (LOVENOX) injection  40 mg Subcutaneous QHS  . levothyroxine  50 mcg Intravenous Daily  . metoprolol tartrate  25 mg Oral BID  . pantoprazole  40 mg Oral QHS    Continuous Infusions: . dextrose 5 % and 0.9 % NaCl with KCl 20 mEq/L 40 mL/hr at 02/18/16 1623    PRN Meds: acetaminophen, hydrALAZINE, levalbuterol, LORazepam, morphine injection, oxyCODONE, traMADol  Physical Exam: Physical Exam  Constitutional: She appears lethargic.  Frail, cachetic, appears acutely ill  HENT:  Head: Normocephalic.  Neck:  Neck hperextended  Cardiovascular: Normal rate, regular rhythm and normal heart  sounds.   Pulmonary/Chest: Effort normal. She has decreased breath sounds in the right lower field and the left lower field.  Neurological: She appears lethargic. She displays atrophy.  -easily agitated   Skin: Skin is warm and dry.  Psychiatric:  Anxious, agitated                Vital Signs: BP 149/72 mmHg  Pulse 72  Temp(Src) 98.7 F (37.1 C) (Axillary)  Resp 20  Ht 5\' 5"  (1.651 m)  Wt 57.607 kg (127 lb)  BMI 21.13 kg/m2  SpO2 99% SpO2: SpO2: 99 % O2 Device:  O2 Device: Not Delivered O2 Flow Rate: O2 Flow Rate (L/min): 2 L/min  Intake/output summary:   Intake/Output Summary (Last 24 hours) at 02/20/16 1215 Last data filed at 02/20/16 0700  Gross per 24 hour  Intake   1000 ml  Output      0 ml  Net   1000 ml   LBM: Last BM Date: 02/14/16 Baseline Weight: Weight: 57.607 kg (127 lb) Most recent weight: Weight: 57.607 kg (127 lb)       Palliative Assessment/Data: Flowsheet Rows        Most Recent Value   Intake Tab    Referral Department  Hospitalist   Unit at Time of Referral  Med/Surg Unit   Palliative Care Primary Diagnosis  Sepsis/Infectious Disease   Date Notified  02/17/16   Palliative Care Type  New Palliative care   Date of Admission  02/11/16   Date first seen by Palliative Care  02/18/16   # of days Palliative referral response time  1 Day(s)   # of days IP prior to Palliative referral  6   Clinical Assessment    Palliative Performance Scale Score  20%   Pain Max last 24 hours  Not able to report   Pain Min Last 24 hours  Not able to report   Dyspnea Max Last 24 Hours  Not able to report   Dyspnea Min Last 24 hours  Not able to report   Nausea Max Last 24 Hours  Not able to report   Nausea Min Last 24 Hours  Not able to report   Anxiety Max Last 24 Hours  Not able to report   Anxiety Min Last 24 Hours  Not able to report   Other Max Last 24 Hours  Not able to report   Psychosocial & Spiritual Assessment    Palliative Care Outcomes    Patient/Family  meeting held?  Yes   Palliative Care Outcomes  Completed durable DNR   Palliative Care follow-up planned  Yes, Facility      Additional Data Reviewed: CBC    Component Value Date/Time   WBC 8.2 02/20/2016 0545   RBC 3.55* 02/20/2016 0545   HGB 11.2* 02/20/2016 0545   HCT 34.7* 02/20/2016 0545   PLT 342 02/20/2016 0545   MCV 97.7 02/20/2016 0545   MCH 31.5 02/20/2016 0545   MCHC 32.3 02/20/2016 0545   RDW 17.5* 02/20/2016 0545   LYMPHSABS 0.9 02/11/2016 1840   MONOABS 0.8 02/11/2016 1840   EOSABS 0.0 02/11/2016 1840   BASOSABS 0.0 02/11/2016 1840    CMP     Component Value Date/Time   NA 137 02/20/2016 0545   K 3.6 02/20/2016 0545   CL 110 02/20/2016 0545   CO2 21* 02/20/2016 0545   GLUCOSE 119* 02/20/2016 0545   BUN 14 02/20/2016 0545   CREATININE 0.44 02/20/2016 0545   CALCIUM 7.9* 02/20/2016 0545   PROT 6.3* 02/19/2016 0555   ALBUMIN 3.4* 02/19/2016 0555   AST 28 02/19/2016 0555   ALT 27 02/19/2016 0555   ALKPHOS 158* 02/19/2016 0555   BILITOT 1.2 02/19/2016 0555   GFRNONAA >60 02/20/2016 0545   GFRAA >60 02/20/2016 0545       Problem List:  Patient Active Problem List   Diagnosis Date Noted  . Palliative care encounter   . Influenza due to influenza virus, type B 02/15/2016  . DVT of lower limb, acute ([place]) 02/15/2016  . Pressure ulcer 02/13/2016  . Acute encephalopathy 02/12/2016  .  HCAP (healthcare-associated pneumonia) 02/11/2016  . Hypokalemia 02/11/2016  . GERD (gastroesophageal reflux disease)   . History of TIA (transient ischemic attack)   . Carotid artery disease ([place])   . Pelvic hematoma, female 01/18/2016  . Acute blood loss anemia 01/18/2016  . Thrombocytopenia ([place]) 01/18/2016  . Hyponatremia 01/18/2016  . Bilateral pubic rami fractures ([place]) 01/14/2016  . Precordial pain 04/15/2014  . Chronic atrial fibrillation ([place]) 06/03/2013  . Hypothyroidism 03/26/2012  . Essential hypertension, benign 03/27/2010  . HYPERLIPIDEMIA-MIXED  10/03/2009     Palliative Care Assessment & Plan    Code Status:  DNR    Code Status Orders        Start     Ordered   02/18/16 [id number]  Do not attempt resuscitation (DNR)   Continuous    Question Answer Comment  In the event of cardiac or respiratory ARREST Do not call a "code blue"   In the event of cardiac or respiratory ARREST Do not perform Intubation, CPR, defibrillation or ACLS   In the event of cardiac or respiratory ARREST Use medication by any route, position, wound care, and other measures to relive pain and suffering. May use oxygen, suction and manual treatment of airway obstruction as needed for comfort.      02/18/16 1438    Code Status History    Date Active Date Inactive Code Status Order ID Comments User Context   02/11/2016 10:02 PM 02/18/2016  2:38 PM Full Code [id number]  Ivor Costa, MD ED   01/18/2016 10:33 PM 01/21/2016  6:02 PM Full Code [id number]  Edwin Dada, MD ED   01/14/2016  4:58 PM 01/17/2016  5:28 PM Full Code [id number]  Venetia Maxon Rama, MD Inpatient        Goals of Care/Additional Recommendations:  Continue current medical interventions, daughter speaks to her discussion with attending that "we would know something by Wednesday".  Jasmine Torres seems to be holding on to Wednesday as the time "she will know" what to do.    Desire for further Chaplaincy support: yes, consult placed  Psycho-social Needs: Grief/Bereavement Support   Symptom Management      Pain: Morphine 1 mg IV every 3 hrs prn      Agitation: 0.5 mg  IV every 3 hrs prn   Palliative Prophylaxis:   Bowel Regimen, Delirium Protocol, Frequent Pain Assessment and Turn Reposition  Prognosis: regarding of current medical interventions, I beleive Jasmine Torres is transitioning at EOL and her prognosis is likely days to weeks   Discharge Planning:  Thank you for allowing the Palliative Medicine Team to assist in the care of this patient.   Time In: 1130 Time Out: 1215 Total Time 45 min  Prolonged Time Billed  no         Knox Royalty, NP  02/20/2016, 12:15 PM  Please contact Palliative Medicine Team phone at [phone] for questions and concerns.

## 2016-02-20 NOTE — Progress Notes (Signed)
PT Cancellation Note  Patient Details Name: JAMYE THEUNE MRN: [id number] DOB: 1925-09-04   Cancelled Treatment:    Reason Eval/Treat Not Completed: Other (comment) Daughter reports pt agitated when awakened today and declines PT for pt.  Daughter would like PT to continue to check back on pt.   Brittany Osier,KATHrine E 02/20/2016, 4:21 PM Carmelia Bake, PT, DPT 02/20/2016 Pager: [phone]

## 2016-02-21 DIAGNOSIS — R52 Pain, unspecified: Secondary | ICD-10-CM | POA: Insufficient documentation

## 2016-02-21 DIAGNOSIS — G9341 Metabolic encephalopathy: Secondary | ICD-10-CM | POA: Diagnosis present

## 2016-02-21 DIAGNOSIS — Z66 Do not resuscitate: Secondary | ICD-10-CM | POA: Insufficient documentation

## 2016-02-21 LAB — CBC
HCT: 36.5 % (ref 36.0–46.0)
HEMOGLOBIN: 11.3 g/dL — AB (ref 12.0–15.0)
MCH: 31 pg (ref 26.0–34.0)
MCHC: 31 g/dL (ref 30.0–36.0)
MCV: 100.3 fL — ABNORMAL HIGH (ref 78.0–100.0)
Platelets: 336 10*3/uL (ref 150–400)
RBC: 3.64 MIL/uL — ABNORMAL LOW (ref 3.87–5.11)
RDW: 17.9 % — AB (ref 11.5–15.5)
WBC: 6.3 10*3/uL (ref 4.0–10.5)

## 2016-02-21 LAB — BASIC METABOLIC PANEL
Anion gap: 7 (ref 5–15)
BUN: 13 mg/dL (ref 6–20)
CALCIUM: 7.7 mg/dL — AB (ref 8.9–10.3)
CHLORIDE: 110 mmol/L (ref 101–111)
CO2: 22 mmol/L (ref 22–32)
CREATININE: 0.46 mg/dL (ref 0.44–1.00)
GFR calc non Af Amer: >60 mL/min (ref >60)
Glucose, Bld: 113 mg/dL — ABNORMAL HIGH (ref 65–99)
Potassium: 3.6 mmol/L (ref 3.5–5.1)
SODIUM: 139 mmol/L (ref 135–145)

## 2016-02-21 MED ORDER — ACETAMINOPHEN 650 MG RE SUPP
650.0000 mg | RECTAL | Status: DC | PRN
  Administered 2016-02-22: 650 mg via RECTAL
  Filled 2016-02-21: qty 1

## 2016-02-21 NOTE — Progress Notes (Signed)
Daily Progress Note   Patient Name: Jasmine Torres       Date: 02/21/2016 DOB: 1925-04-07  Age: 82 y.o. MRN#: [id number] Attending Physician: Nat Math, MD Primary Care Physician: [name], MD Admit Date: 02/11/2016  Reason for Consultation/Follow-up: Establishing goals of care and Pain control  Subjective:  Continued conversation with daughter/Jasmine Torres by telephone.    Patient is not taking any po intake, is minimally responsive and appears more comfortable today.  We discussed  the natural trajectory and expectations at EOL.  Continued discussion regarding symptom management specific to pain and utilization of medication to enhance comfort.    Jasmine Torres understands the overall poor prognosis and agrees today to make referral to hospice facility  Length of Stay: 10 days  Current Medications: Scheduled Meds:  . enoxaparin (LOVENOX) injection  40 mg Subcutaneous QHS  . levothyroxine  50 mcg Intravenous Daily  . metoprolol tartrate  25 mg Oral BID  . pantoprazole  40 mg Oral QHS    Continuous Infusions: . dextrose 5 % and 0.9 % NaCl with KCl 20 mEq/L 40 mL/hr at 02/20/16 1724    PRN Meds: acetaminophen, hydrALAZINE, levalbuterol, LORazepam, morphine injection, traMADol  Physical Exam: Physical Exam  Constitutional:  Frail, cachetic, appears acutely ill, more comfortable today  HENT:  Head: Normocephalic.  Mouth/Throat: Mucous membranes are dry.  Neck:  Neck hperextended  Cardiovascular: Normal rate, regular rhythm and normal heart sounds.   Pulmonary/Chest: Effort normal. She has decreased breath sounds in the right lower field and the left lower field.  Neurological: She is unresponsive. She displays atrophy.  -easily agitated   Skin: Skin is warm and dry.                 Vital Signs: BP 137/78 mmHg  Pulse 92  Temp(Src) 98.6 F (37 C) (Axillary)  Resp 20  Ht 5\' 5"  (1.651 m)  Wt 127 lb (57.607 kg)  BMI 21.13 kg/m2  SpO2 95% SpO2: SpO2: 95 % O2 Device: O2 Device: Not Delivered O2 Flow Rate: O2 Flow Rate (L/min): 2 L/min  Intake/output summary:   Intake/Output Summary (Last 24 hours) at 02/21/16 0907 Last data filed at 02/21/16 0700  Gross per 24 hour  Intake    960 ml  Output      0 ml  Net  960 ml   LBM: Last BM Date: 02/14/16 Baseline Weight: Weight: 127 lb (57.607 kg) Most recent weight: Weight: 127 lb (57.607 kg)       Palliative Assessment/Data: Flowsheet Rows        Most Recent Value   Intake Tab    Referral Department  Hospitalist   Unit at Time of Referral  Med/Surg Unit   Palliative Care Primary Diagnosis  Sepsis/Infectious Disease   Date Notified  02/17/16   Palliative Care Type  New Palliative care   Date of Admission  02/11/16   Date first seen by Palliative Care  02/18/16   # of days Palliative referral response time  1 Day(s)   # of days IP prior to Palliative referral  6   Clinical Assessment    Palliative Performance Scale Score  20%   Pain Max last 24 hours  Not able to report   Pain Min Last 24 hours  Not able to report   Dyspnea Max Last 24 Hours  Not able to report   Dyspnea Min Last 24 hours  Not able to report   Nausea Max Last 24 Hours  Not able to report   Nausea Min Last 24 Hours  Not able to report   Anxiety Max Last 24 Hours  Not able to report   Anxiety Min Last 24 Hours  Not able to report   Other Max Last 24 Hours  Not able to report   Psychosocial & Spiritual Assessment    Palliative Care Outcomes    Patient/Family meeting held?  Yes   Palliative Care Outcomes  Completed durable DNR   Palliative Care follow-up planned  Yes, Facility      Additional Data Reviewed: CBC    Component Value Date/Time   WBC 6.3 02/21/2016 0503   RBC 3.64* 02/21/2016 0503   HGB 11.3* 02/21/2016 0503   HCT  36.5 02/21/2016 0503   PLT 336 02/21/2016 0503   MCV 100.3* 02/21/2016 0503   MCH 31.0 02/21/2016 0503   MCHC 31.0 02/21/2016 0503   RDW 17.9* 02/21/2016 0503   LYMPHSABS 0.9 02/11/2016 1840   MONOABS 0.8 02/11/2016 1840   EOSABS 0.0 02/11/2016 1840   BASOSABS 0.0 02/11/2016 1840    CMP     Component Value Date/Time   NA 139 02/21/2016 0503   K 3.6 02/21/2016 0503   CL 110 02/21/2016 0503   CO2 22 02/21/2016 0503   GLUCOSE 113* 02/21/2016 0503   BUN 13 02/21/2016 0503   CREATININE 0.46 02/21/2016 0503   CALCIUM 7.7* 02/21/2016 0503   PROT 6.3* 02/19/2016 0555   ALBUMIN 3.4* 02/19/2016 0555   AST 28 02/19/2016 0555   ALT 27 02/19/2016 0555   ALKPHOS 158* 02/19/2016 0555   BILITOT 1.2 02/19/2016 0555   GFRNONAA >60 02/21/2016 0503   GFRAA >60 02/21/2016 0503       Problem List:  Patient Active Problem List   Diagnosis Date Noted  . Palliative care encounter   . Influenza due to influenza virus, type B 02/15/2016  . DVT of lower limb, acute ([place]) 02/15/2016  . Pressure ulcer 02/13/2016  . Acute encephalopathy 02/12/2016  . HCAP (healthcare-associated pneumonia) 02/11/2016  . Hypokalemia 02/11/2016  . GERD (gastroesophageal reflux disease)   . History of TIA (transient ischemic attack)   . Carotid artery disease ([place])   . Pelvic hematoma, female 01/18/2016  . Acute blood loss anemia 01/18/2016  . Thrombocytopenia ([place]) 01/18/2016  . Hyponatremia 01/18/2016  .  Bilateral pubic rami fractures ([place]) 01/14/2016  . Precordial pain 04/15/2014  . Chronic atrial fibrillation ([place]) 06/03/2013  . Hypothyroidism 03/26/2012  . Essential hypertension, benign 03/27/2010  . HYPERLIPIDEMIA-MIXED 10/03/2009     Palliative Care Assessment & Plan    Code Status:  DNR    Code Status Orders        Start     Ordered   02/18/16 [id number]  Do not attempt resuscitation (DNR)   Continuous    Question Answer Comment  In the event of cardiac or respiratory ARREST Do not call a  "code blue"   In the event of cardiac or respiratory ARREST Do not perform Intubation, CPR, defibrillation or ACLS   In the event of cardiac or respiratory ARREST Use medication by any route, position, wound care, and other measures to relive pain and suffering. May use oxygen, suction and manual treatment of airway obstruction as needed for comfort.      02/18/16 1438    Code Status History    Date Active Date Inactive Code Status Order ID Comments User Context   02/11/2016 10:02 PM 02/18/2016  2:38 PM Full Code [id number]  Ivor Costa, MD ED   01/18/2016 10:33 PM 01/21/2016  6:02 PM Full Code [id number]  Edwin Dada, MD ED   01/14/2016  4:58 PM 01/17/2016  5:28 PM Full Code [id number]  Venetia Maxon Rama, MD Inpatient        Goals of Care/Additional Recommendations:  -today focus is comfort, quality and dignity.  Family agree to referral to hospice facility, will write for choice    Desire for further Chaplaincy support: yes, consult placed  Psycho-social Needs: Grief/Bereavement Support   Symptom Management      Pain: Morphine 1 mg IV every 3 hrs prn      Agitation: 0.5 mg  IV every 3 hrs prn   Palliative Prophylaxis:   Bowel Regimen, Delirium Protocol, Frequent Pain Assessment and Turn Reposition  Prognosis:  Days   Discharge Planning:  Hospice facility, will write for choice  Thank you for allowing the Palliative Medicine Team to assist in the care of this patient.   Time In: 0730 Time Out: 0800 Total Time 30 min Prolonged Time Billed  no         Knox Royalty, NP  02/21/2016, 9:07 AM  Please contact Palliative Medicine Team phone at [phone] for questions and concerns.

## 2016-02-21 NOTE — Progress Notes (Signed)
[facility] PROGRESS NOTE  Jasmine Torres [id number] DOB: 03-25-1925 DOA: 02/11/2016 PCP: [name], MD  Summary Jasmine Torres is a pleasant 82 year old female with GERD/Hypothyroidism/chronic atrial fibrillation not anticoagulated because of fall risk/essential hypertension who has had a complicated last 6 weeks.She was Initially admitted to the hospital end of January after fall for non-operative pelvic fracture after which she went to rehab only to return a few days later for low Hgb requiring PRBC transfusion. She then went back to rehab and returned for hyponatremia.She presented to the hospital this time on 02/11/16 with AMS, lethargy, SOB and she was found to be flu B positive with concern for healthcare associated pneumonia. Ms Jasmine Torres completed course of treatment for influenza and HCAP.However, she was also found to have lower extremity DVT. She is not on anticoagulation because of bleeding risk. Unfortunately, patient has been mostly encephalopathic despite treatment of infection, apparently a manifestation of dementia which was not evident when she was first admitted in January. She was noted to have TSH of 27 and has been on intravenous Synthroid since the discovery(supposed to take Synthroid orally at home, not clear if she was taking the Synthroid, if so not clear if doing it properly). Patient has remained mostly obtunded despite acute interventions. She has seemed to be in pain whenever she has passive movement, possibly related to an healed pelvic fracture. Her family is considering transitioning to hospice if clinical condition does not improve. I have had extensive discussions with patient's daughter Jasmine Torres who is very attentive and she will make a decision regarding hospice if no change in mental status in the next 24-48 hours. Meanwhile, patient being followed by palliative care to help with goals of care. Appreciate help. Patient does not respond to my questions today. We'll  continue supportive measures-focusing more on pain control while giving time for Synthroid. Daughter reiterated that patient would not want artificial feeds. Plan HCAP (healthcare-associated pneumonia)/Influenza type B   Completed course of treatment Hypothyroidism/Acute Metabolic Encephalopathy ?Dementia  Remains mostly out of it  Continue Synthroid IV  Essential hypertension, benign/Chronic atrial fibrillation (HCC)/GERD (gastroesophageal reflux disease)  No acute changes  Patient not taking oral medications Stage 2 sacral decubitus  Ulcer present on admission  Regular turns DVT of lower limb, acute ([place])  Not anticoagulated due to bleeding risk, IVC filter not pursued after discussions with family at time of diagnosis Recent Bilateral pubic ramus fracture(end of January)  Pain control  Code Status: DNR Family Communication: Daughter Jasmine Torres at the bedside Disposition Plan: Likely Hospice house unless clinical condition improves, at which point short-term rehabilitation next alternative   Consultants:  Palliative care  Procedures:    Antibiotics:  Completed course of antibiotics for healthcare associated pneumonia/influenza type B  HPI/Subjective: Unresponsive  Objective: Filed Vitals:   02/20/16 2141 02/21/16 0517  BP: 177/94 137/78  Pulse: 63 92  Temp: 98.7 F (37.1 C) 98.6 F (37 C)  Resp: 22 20    Intake/Output Summary (Last 24 hours) at 02/21/16 1110 Last data filed at 02/21/16 0700  Gross per 24 hour  Intake    960 ml  Output      0 ml  Net    960 ml   Filed Weights   02/11/16 2203  Weight: 57.607 kg (127 lb)    Exam:   General:  Mild respiratory distress.  Cardiovascular: S1-S2 normal. No murmurs. Pulse regular.  Respiratory: Good air entry bilaterally. No rhonchi or rales. Mild respiratory distress  Abdomen: Soft  and nontender. Normal bowel sounds. No organomegaly.  Musculoskeletal: No pedal edema   Neurological:  Unresponsive  Data Reviewed: Basic Metabolic Panel:  Recent Labs Lab 02/17/16 0525 02/18/16 0552 02/19/16 0555 02/20/16 0545 02/21/16 0503  NA 135 134* 140 137 139  K 3.8 3.7 4.3 3.6 3.6  CL 102 102 106 110 110  CO2 23 21* 23 21* 22  GLUCOSE 115* 94 122* 119* 113*  BUN 21* 20 18 14 13   CREATININE 0.55 0.59 0.64 0.44 0.46  CALCIUM 8.7* 8.5* 8.5* 7.9* 7.7*   Liver Function Tests:  Recent Labs Lab 02/19/16 0555  AST 28  ALT 27  ALKPHOS 158*  BILITOT 1.2  PROT 6.3*  ALBUMIN 3.4*   No results for input(s): LIPASE, AMYLASE in the last 168 hours. No results for input(s): AMMONIA in the last 168 hours. CBC:  Recent Labs Lab 02/17/16 0525 02/18/16 0552 02/19/16 0555 02/20/16 0545 02/21/16 0503  WBC 6.7 6.9 8.2 8.2 6.3  HGB 11.9* 11.7* 12.0 11.2* 11.3*  HCT 37.4 36.9 38.3 34.7* 36.5  MCV 97.4 98.4 99.0 97.7 100.3*  PLT 443* 434* 433* 342 336   Cardiac Enzymes: No results for input(s): CKTOTAL, CKMB, CKMBINDEX, TROPONINI in the last 168 hours. BNP (last 3 results)  Recent Labs  02/11/16 2034  BNP 396.7*    ProBNP (last 3 results) No results for input(s): PROBNP in the last 8760 hours.  CBG: No results for input(s): GLUCAP in the last 168 hours.  Recent Results (from the past 240 hour(s))  Blood Culture (routine x 2)     Status: None   Collection Time: 02/11/16  6:40 PM  Result Value Ref Range Status   Specimen Description BLOOD RIGHT ARM  Final   Special Requests BOTTLES DRAWN AEROBIC AND ANAEROBIC 5CC  Final   Culture   Final    NO GROWTH 5 DAYS Performed at [facility]    Report Status 02/17/2016 FINAL  Final  Blood Culture (routine x 2)     Status: None   Collection Time: 02/11/16  6:40 PM  Result Value Ref Range Status   Specimen Description RIGHT ANTECUBITAL  Final   Special Requests BOTTLES DRAWN AEROBIC AND ANAEROBIC 5CC  Final   Culture   Final    NO GROWTH 5 DAYS Performed at [facility]    Report Status 02/17/2016  FINAL  Final  Urine culture     Status: None   Collection Time: 02/11/16  7:52 PM  Result Value Ref Range Status   Specimen Description URINE, CATHETERIZED  Final   Special Requests NONE  Final   Culture   Final    NO GROWTH 1 DAY Performed at [facility]    Report Status 02/13/2016 FINAL  Final  MRSA PCR Screening     Status: None   Collection Time: 02/11/16 10:23 PM  Result Value Ref Range Status   MRSA by PCR NEGATIVE NEGATIVE Final    Comment:        The GeneXpert MRSA Assay (FDA approved for NASAL specimens only), is one component of a comprehensive MRSA colonization surveillance program. It is not intended to diagnose MRSA infection nor to guide or monitor treatment for MRSA infections.   Respiratory virus panel     Status: Abnormal   Collection Time: 02/11/16 11:02 PM  Result Value Ref Range Status   Respiratory Syncytial Virus A Negative Negative Final   Respiratory Syncytial Virus B Negative Negative Final   Influenza A  Negative Negative Final   Influenza B Positive (A) Negative Final   Parainfluenza 1 Negative Negative Final   Parainfluenza 2 Negative Negative Final   Parainfluenza 3 Negative Negative Final   Metapneumovirus Negative Negative Final   Rhinovirus Negative Negative Final   Adenovirus Negative Negative Final    Comment: (NOTE) Performed At: [facility] [place], [place] [id number] Lindon Romp MD [id number]      Studies: No results found.  Scheduled Meds: . enoxaparin (LOVENOX) injection  40 mg Subcutaneous QHS  . levothyroxine  50 mcg Intravenous Daily  . metoprolol tartrate  25 mg Oral BID  . pantoprazole  40 mg Oral QHS   Continuous Infusions: . dextrose 5 % and 0.9 % NaCl with KCl 20 mEq/L 40 mL/hr at 02/20/16 1724     Time spent: 25 minutes    Jasmine Torres  [facility] Pager [phone]. If 7PM-7AM, please contact night-coverage at [email or web address], password [facility] 02/21/2016, 11:10  AM  LOS: 10 days

## 2016-02-21 NOTE — Progress Notes (Signed)
Nutrition Brief Note  Patient identified for LOS (10 days).  Wt Readings from Last 15 Encounters:  02/11/16 127 lb (57.607 kg)  01/20/16 131 lb 9.8 oz (59.7 kg)  01/14/16 127 lb (57.607 kg)  01/11/16 127 lb (57.607 kg)  07/19/15 129 lb (58.514 kg)  01/20/15 130 lb 12.8 oz (59.33 kg)  07/27/14 132 lb (59.875 kg)  04/21/14 130 lb (58.968 kg)  04/07/14 130 lb (58.968 kg)  02/23/14 135 lb (61.236 kg)  10/19/13 135 lb (61.236 kg)  06/03/13 136 lb (61.689 kg)  05/20/13 135 lb (61.236 kg)  05/14/13 135 lb 1.6 oz (61.281 kg)  03/20/13 136 lb 12.8 oz (62.052 kg)    Body mass index is 21.13 kg/(m^2). Patient meets criteria for normal weight based on current BMI.   Current diet order is Dysphagia 3 with thin liquids, patient is consuming approximately 10-50% of meals at this time. Labs and medications reviewed.   Spoke with family member, who is at bedside, as pt is sleeping. She denies any nutrition-related needs or any questions at this time.  MD note from yesterday (3/6) states d/c plan: Likely Hospice house unless clinical condition improves, at which point short-term rehabilitation next alternative   No nutrition interventions warranted at this time. If nutrition issues arise, please consult RD.     Jarome Matin, RD, LDN Inpatient Clinical Dietitian Pager # [phone] After hours/weekend pager # [phone]

## 2016-02-21 NOTE — Progress Notes (Signed)
   02/21/16 1600  Clinical Encounter Type  Visited With Patient;Family;Patient and family together  Visit Type Initial;Psychological support;Spiritual support;Social support;Critical Care  Referral From Palliative care team  Spiritual Encounters  Spiritual Needs Emotional;Grief support;Prayer  Ch visited with daughter [name] at bedside while pt slept; [facility] discussed with pt difficult decision and grief stages along with stories of pt; daughter recognizes difficult period; [facility] will monitor and follow-up. 4:15 PM. Gwynn Burly

## 2016-02-22 DIAGNOSIS — I482 Chronic atrial fibrillation: Secondary | ICD-10-CM

## 2016-02-22 DIAGNOSIS — J189 Pneumonia, unspecified organism: Secondary | ICD-10-CM

## 2016-02-22 DIAGNOSIS — I10 Essential (primary) hypertension: Secondary | ICD-10-CM

## 2016-02-22 DIAGNOSIS — R451 Restlessness and agitation: Secondary | ICD-10-CM | POA: Insufficient documentation

## 2016-02-22 DIAGNOSIS — G9341 Metabolic encephalopathy: Secondary | ICD-10-CM

## 2016-02-22 DIAGNOSIS — S32502D Unspecified fracture of left pubis, subsequent encounter for fracture with routine healing: Secondary | ICD-10-CM

## 2016-02-22 DIAGNOSIS — L8992 Pressure ulcer of unspecified site, stage 2: Secondary | ICD-10-CM

## 2016-02-22 DIAGNOSIS — K219 Gastro-esophageal reflux disease without esophagitis: Secondary | ICD-10-CM

## 2016-02-22 DIAGNOSIS — S32501D Unspecified fracture of right pubis, subsequent encounter for fracture with routine healing: Secondary | ICD-10-CM

## 2016-02-22 DIAGNOSIS — E038 Other specified hypothyroidism: Secondary | ICD-10-CM

## 2016-02-22 LAB — BASIC METABOLIC PANEL
ANION GAP: 9 (ref 5–15)
BUN: 14 mg/dL (ref 6–20)
CALCIUM: 7.9 mg/dL — AB (ref 8.9–10.3)
CO2: 24 mmol/L (ref 22–32)
Chloride: 112 mmol/L — ABNORMAL HIGH (ref 101–111)
Creatinine, Ser: 0.51 mg/dL (ref 0.44–1.00)
GLUCOSE: 123 mg/dL — AB (ref 65–99)
POTASSIUM: 4.1 mmol/L (ref 3.5–5.1)
Sodium: 145 mmol/L (ref 135–145)

## 2016-02-22 LAB — CBC
HEMATOCRIT: 38.5 % (ref 36.0–46.0)
Hemoglobin: 11.8 g/dL — ABNORMAL LOW (ref 12.0–15.0)
MCH: 31.2 pg (ref 26.0–34.0)
MCHC: 30.6 g/dL (ref 30.0–36.0)
MCV: 101.9 fL — AB (ref 78.0–100.0)
PLATELETS: 322 10*3/uL (ref 150–400)
RBC: 3.78 MIL/uL — AB (ref 3.87–5.11)
RDW: 18.1 % — AB (ref 11.5–15.5)
WBC: 7 10*3/uL (ref 4.0–10.5)

## 2016-02-22 NOTE — Progress Notes (Signed)
CSW received consult for residential hospice placement. CSW spoke with patient's daughter, Richardine Service (ph#: [phone]) who states that they would prefer [facility]. CSW confirmed with Harmon Pier at [facility] that they have no availability today. CSW will check back in the morning. Patient's daughter states that although she would really prefer [facility] but would possibly be open to [place] [facility]. CSW made referral to Beverlee Nims at [facility]) and will check back with Harmon Pier in the morning re: availability.    Raynaldo Opitz, [facility] Clinical Social Worker cell #: [phone]

## 2016-02-22 NOTE — Progress Notes (Signed)
[facility]                                                                              Patient Demographics  Jasmine Torres, is a 82 y.o. female, DOB - 1925-09-01, [id number]  Admit date - 02/11/2016   Admitting Physician Ivor Costa, MD  Outpatient Primary MD for the patient is [facility], [place]  LOS - 11   Chief Complaint  Patient presents with  . Fever      HPI on 02/11/2016 by Dr. Ivor Costa Jasmine Torres is a 82 y.o. female with PMH of hypertension, GERD, hypothyroidism, anxiety, atrial fibrillation not on anticoagulants due to for any GI bleeding, aortic valve disease, TIA, pulmonary nodule use, paraspinal mass, skin cancer, GI bleeding, carotid artery stenosis, who presents with fever, cough, shortness breath, altered mental status. Per family, patient has been sleeping a lot in the past 2 days, she is very drowsy and confused. She has dry cough and shortness of breath. She does not seem to have chest pain per family. No runny nose or sore throat. She has whole body aching. She had 1 loose stool bowel movement in the morning, but no abdominal pain, nausea, vomiting. Currently no diarrhea. Patient had pelvic fracture on 01/14/16 and did not have surgery. She still has some pain over bilateral hips per family. In ED, patient was found to have WBC 10.1, negative urinalysis, likely to 1.08, temperature 101.2, tachycardia, tachypnea, oxygen desaturation to 88% on room air, potassium 3.4, renal function okay, chest x-ray showed diffuse interstitial change.  Interim history Found to have Influenza B, HCAP, and LE DVT, hypothyroidism. Despite interventions and treatments, patient continues to be obtunded. Palliative care consulted. Will transition to hospice care.  Assessment & Plan   HCAP/nfluenza B -Completed course of treatment  Hypothyroidism/acute metabolic encephalopathy -Question underlying dementia -TSH 27 -Continue IV Synthroid  Essential hypertension -Continue  metoprolol  GERD -Continue PPI  Chronic atrial fibrillation -CHADSVASC 4 (age, gender, HTN) -Continue metoprolol  Sacral decubitus ulcer, stage II -Present upon admission  Lower extremity DVT -No anticoagulation due to bleeding risk, IVC filter not pursued  Recent bilateral pubic ramus fracture -January 2017 -Continue pain control  Code Status: DNR  Family Communication: Daughter at bedside.  Disposition Plan: Admitted. Pending Hospice placement.  Time Spent in minutes   30 minutes  Procedures  LE doppler Echocardiogram  Consults   Palliative care  DVT Prophylaxis  Lovenox  Lab Results  Component Value Date   PLT 322 02/22/2016    Medications  Scheduled Meds: . enoxaparin (LOVENOX) injection  40 mg Subcutaneous QHS  . levothyroxine  50 mcg Intravenous Daily  . metoprolol tartrate  25 mg Oral BID  . pantoprazole  40 mg Oral QHS   Continuous Infusions: . dextrose 5 % and 0.9 % NaCl with KCl 20 mEq/L 40 mL/hr at 02/20/16 1724   PRN Meds:.acetaminophen, acetaminophen, hydrALAZINE, levalbuterol, LORazepam, morphine injection, traMADol  Antibiotics    Anti-infectives    Start     Dose/Rate Route Frequency Ordered Stop   02/15/16 1200  levofloxacin (LEVAQUIN) tablet 750 mg     750 mg Oral Every 48 hours 02/15/16 1043 02/17/16 1137  02/12/16 2300  vancomycin (VANCOCIN) IVPB 1000 mg/200 mL premix  Status:  Discontinued     1,000 mg 200 mL/hr over 60 Minutes Intravenous Every 24 hours 02/11/16 2046 02/15/16 1043   02/12/16 1030  oseltamivir (TAMIFLU) capsule 30 mg     30 mg Oral 2 times daily 02/12/16 0956 02/16/16 2052   02/12/16 0500  aztreonam (AZACTAM) 1 g in dextrose 5 % 50 mL IVPB  Status:  Discontinued     1 g 100 mL/hr over 30 Minutes Intravenous Every 8 hours 02/11/16 2046 02/15/16 1043   02/11/16 2030  aztreonam (AZACTAM) 2 g in dextrose 5 % 50 mL IVPB     2 g 100 mL/hr over 30 Minutes Intravenous  Once 02/11/16 2027 02/11/16 2136   02/11/16  2030  vancomycin (VANCOCIN) IVPB 1000 mg/200 mL premix     1,000 mg 200 mL/hr over 60 Minutes Intravenous  Once 02/11/16 2027 02/12/16 0039      Subjective:   Jasmine Torres seen and examined today.  Patient currently obtunded and moans.    Objective:   Filed Vitals:   02/21/16 2115 02/22/16 0003 02/22/16 0356 02/22/16 0629  BP: 139/103   145/70  Pulse: 69   77  Temp: 100.5 F (38.1 C) 100.3 F (37.9 C) 98.4 F (36.9 C) 98.4 F (36.9 C)  TempSrc: Axillary Axillary Axillary Axillary  Resp: 22   24  Height:      Weight:      SpO2: 100%   100%    Wt Readings from Last 3 Encounters:  02/11/16 57.607 kg (127 lb)  01/20/16 59.7 kg (131 lb 9.8 oz)  01/14/16 57.607 kg (127 lb)     Intake/Output Summary (Last 24 hours) at 02/22/16 1435 Last data filed at 02/22/16 0700  Gross per 24 hour  Intake    960 ml  Output    400 ml  Net    560 ml    Exam  General: Well developed, Cachectic, ill  HEENT: NCAT, mucous membranes dry.   Cardiovascular: S1 S2 auscultated, irregular.  Respiratory: Diminished breath sounds  Abdomen: Soft, nontender, nondistended, + bowel sounds  Extremities: warm dry without cyanosis clubbing or edema  Data Review   Micro Results No results found for this or any previous visit (from the past 240 hour(s)).  Radiology Reports Dg Lumbar Spine 2-3 Views  02/13/2016  CLINICAL DATA:  Low back pain, no known injury EXAM: LUMBAR SPINE - 2-3 VIEW COMPARISON:  01/18/2016 FINDINGS: Three views of lumbar spine submitted. No acute fracture or subluxation. Mild disc space flattening with anterior spurring noted at L2-L3 level. Minimal disc space flattening at L5-S1 level. Mild facet degenerative changes noted L5 level. The alignment and vertebral body heights are preserved. There is probable Schmorl's node deformity lower endplate of L1 vertebral body. IMPRESSION: No acute fracture or subluxation. Mild degenerative changes L2-L3 and L5-S1 level. Alignment and  vertebral body heights are preserved. Electronically Signed   By: Lahoma Crocker M.D.   On: 02/13/2016 15:20   Dg Chest Port 1 View  02/11/2016  CLINICAL DATA:  Fevers and shortness of Breath EXAM: PORTABLE CHEST 1 VIEW COMPARISON:  01/14/2016 FINDINGS: Cardiac shadow is stable. Diffuse interstitial changes are noted bilaterally without frank pulmonary edema. No focal confluent infiltrate is seen. No bony abnormality is noted. IMPRESSION: Diffuse interstitial changes which may represents a a degree of bronchitis or congestive failure. No frank edema is seen. Electronically Signed   By: Linus Mako.D.  On: 02/11/2016 18:55    CBC  Recent Labs Lab 02/18/16 0552 02/19/16 0555 02/20/16 0545 02/21/16 0503 02/22/16 0517  WBC 6.9 8.2 8.2 6.3 7.0  HGB 11.7* 12.0 11.2* 11.3* 11.8*  HCT 36.9 38.3 34.7* 36.5 38.5  PLT 434* 433* 342 336 322  MCV 98.4 99.0 97.7 100.3* 101.9*  MCH 31.2 31.0 31.5 31.0 31.2  MCHC 31.7 31.3 32.3 31.0 30.6  RDW 17.4* 17.4* 17.5* 17.9* 18.1*    Chemistries   Recent Labs Lab 02/18/16 0552 02/19/16 0555 02/20/16 0545 02/21/16 0503 02/22/16 0517  NA 134* 140 137 139 145  K 3.7 4.3 3.6 3.6 4.1  CL 102 106 110 110 112*  CO2 21* 23 21* 22 24  GLUCOSE 94 122* 119* 113* 123*  BUN 20 18 14 13 14   CREATININE 0.59 0.64 0.44 0.46 0.51  CALCIUM 8.5* 8.5* 7.9* 7.7* 7.9*  AST  --  28  --   --   --   ALT  --  27  --   --   --   ALKPHOS  --  158*  --   --   --   BILITOT  --  1.2  --   --   --    ------------------------------------------------------------------------------------------------------------------ estimated creatinine clearance is 43.7 mL/min (by C-G formula based on Cr of 0.51). ------------------------------------------------------------------------------------------------------------------ No results for input(s): HGBA1C in the last 72  hours. ------------------------------------------------------------------------------------------------------------------ No results for input(s): CHOL, HDL, LDLCALC, TRIG, CHOLHDL, LDLDIRECT in the last 72 hours. ------------------------------------------------------------------------------------------------------------------ No results for input(s): TSH, T4TOTAL, T3FREE, THYROIDAB in the last 72 hours.  Invalid input(s): FREET3 ------------------------------------------------------------------------------------------------------------------ No results for input(s): VITAMINB12, FOLATE, FERRITIN, TIBC, IRON, RETICCTPCT in the last 72 hours.  Coagulation profile No results for input(s): INR, PROTIME in the last 168 hours.  No results for input(s): DDIMER in the last 72 hours.  Cardiac Enzymes No results for input(s): CKMB, TROPONINI, MYOGLOBIN in the last 168 hours.  Invalid input(s): CK ------------------------------------------------------------------------------------------------------------------ Invalid input(s): POCBNP    Jenne Sellinger D.O. on 02/22/2016 at 2:35 PM  Between 7am to 7pm - Pager - [phone]  After 7pm go to [email or web address] - password [email or web address]  And look for the night coverage person covering for me after hours  [facility] Office  [phone]

## 2016-02-22 NOTE — Progress Notes (Signed)
PT Cancellation Note  Patient Details Name: NOHELY GUIA MRN: [id number] DOB: 1925/01/02   Cancelled Treatment:    Reason Eval/Treat Not Completed: Other (comment) (not yet ready to participate per RN and daughter)   Trena Platt 02/22/2016, 11:39 AM Carmelia Bake, PT, DPT 02/22/2016 Pager: [phone]

## 2016-02-22 NOTE — Progress Notes (Signed)
   02/22/16 1500  Clinical Encounter Type  Visited With Patient;Patient not available  Visit Type Follow-up  Pt asleep and daughter [name] not present.  Jasmine Torres will follow-up. Gwynn Burly 3:20 PM

## 2016-02-22 NOTE — Progress Notes (Signed)
CSW following for discharge planning - patient was admitted from [facility] where she was admitted for rehab. CSW reviewed notes indicating possible residential hospice placement at discharge. CSW will continue to follow and await consult.   Raynaldo Opitz, [facility] Clinical Social Worker cell #: [phone]

## 2016-02-23 DIAGNOSIS — K219 Gastro-esophageal reflux disease without esophagitis: Secondary | ICD-10-CM | POA: Insufficient documentation

## 2016-02-23 DIAGNOSIS — Z515 Encounter for palliative care: Secondary | ICD-10-CM

## 2016-02-23 DIAGNOSIS — E871 Hypo-osmolality and hyponatremia: Secondary | ICD-10-CM

## 2016-02-23 DIAGNOSIS — R451 Restlessness and agitation: Secondary | ICD-10-CM

## 2016-02-23 DIAGNOSIS — J101 Influenza due to other identified influenza virus with other respiratory manifestations: Secondary | ICD-10-CM

## 2016-02-23 DIAGNOSIS — Z66 Do not resuscitate: Secondary | ICD-10-CM

## 2016-02-23 DIAGNOSIS — I824Y9 Acute embolism and thrombosis of unspecified deep veins of unspecified proximal lower extremity: Secondary | ICD-10-CM

## 2016-02-23 MED ORDER — MORPHINE SULFATE (CONCENTRATE) 10 MG/0.5ML PO SOLN
10.0000 mg | ORAL | Status: AC
  Administered 2016-02-23: 10 mg via ORAL
  Filled 2016-02-23: qty 0.5

## 2016-02-23 MED ORDER — LEVOTHYROXINE SODIUM 25 MCG PO TABS: 25.0000 ug | ORAL_TABLET | Freq: Every day | ORAL | Status: AC

## 2016-02-23 MED ORDER — TRAMADOL HCL 50 MG PO TABS: 100.0000 mg | ORAL_TABLET | Freq: Four times a day (QID) | ORAL | Status: AC | PRN

## 2016-02-23 NOTE — Progress Notes (Signed)
CSW received call from Harmon Pier at [facility] that a bed has become available for patient. Patient's daughter, [name] & Dr. Ree Kida made aware.  CSW will facilitate discharge to [facility] once discharge summary is complete.    Raynaldo Opitz, [facility] Clinical Social Worker cell #: [phone]

## 2016-02-23 NOTE — Discharge Summary (Signed)
Physician Discharge Summary  ELEAH FREEH [id number] DOB: 09/13/1925 DOA: 02/11/2016  PCP: [name], MD  Admit date: 02/11/2016 Discharge date: 02/23/2016  Time spent: 45 minutes  Recommendations for Outpatient Follow-up:  Patient will be discharged to [facility].  Continue comfort care measures.  Patient may follow up with primary care provider as needed.  Patient should continue medications as prescribed.  Patient should follow a dysphagia 3 diet.   Discharge Diagnoses:  HCAP/nfluenza B Hypothyroidism/acute metabolic encephalopathy Essential hypertension GERD Chronic atrial fibrillation Sacral decubitus ulcer, stage II Lower extremity DVT Recent bilateral pubic ramus fracture  Discharge Condition: Stable  Diet recommendation: Dysphagia 3  Filed Weights   02/11/16 2203  Weight: 57.607 kg (127 lb)    History of present illness:  on 02/11/2016 by Dr. Ivor Costa KALIANNA WHITEHORSE is a 82 y.o. female with PMH of hypertension, GERD, hypothyroidism, anxiety, atrial fibrillation not on anticoagulants due to for any GI bleeding, aortic valve disease, TIA, pulmonary nodule use, paraspinal mass, skin cancer, GI bleeding, carotid artery stenosis, who presents with fever, cough, shortness breath, altered mental status. Per family, patient has been sleeping a lot in the past 2 days, she is very drowsy and confused. She has dry cough and shortness of breath. She does not seem to have chest pain per family. No runny nose or sore throat. She has whole body aching. She had 1 loose stool bowel movement in the morning, but no abdominal pain, nausea, vomiting. Currently no diarrhea. Patient had pelvic fracture on 01/14/16 and did not have surgery. She still has some pain over bilateral hips per family. In ED, patient was found to have WBC 10.1, negative urinalysis, likely to 1.08, temperature 101.2, tachycardia, tachypnea, oxygen desaturation to 88% on room air, potassium 3.4, renal function okay,  chest x-ray showed diffuse interstitial change.  Hospital Course:  Found to have Influenza B, HCAP, and LE DVT, hypothyroidism. Despite interventions and treatments, patient continues to be obtunded. Palliative care consulted. Will transition to hospice care.  HCAP/nfluenza B -Completed course of treatment  Hypothyroidism/acute metabolic encephalopathy -Question underlying dementia -TSH 29 -Was placed on IV Synthroid, with no improvement in her mental status -May continue PO synthroid 81mcg at discharge  Essential hypertension -Continue metoprolol  GERD -Continue PPI  Chronic atrial fibrillation -CHADSVASC 4 (age, gender, HTN) -Continue metoprolol  Sacral decubitus ulcer, stage II -Present upon admission  Lower extremity DVT -No anticoagulation due to bleeding risk, IVC filter not pursued  Recent bilateral pubic ramus fracture -January 2017 -Continue pain control  Code Status: DNR  Procedures  LE doppler Echocardiogram  Consults  Palliative care  Discharge Exam: Filed Vitals:   02/23/16 0548 02/23/16 0629  BP: 127/99   Pulse: 70   Temp: 99.6 F (37.6 C) 98.5 F (36.9 C)  Resp: 24    Exam  General: Well developed, Cachectic, ill  HEENT: NCAT, mucous membranes dry.   Cardiovascular: S1 S2 auscultated, irregular.  Respiratory: Diminished breath sounds anteriorly  Extremities: warm dry without cyanosis clubbing or edema  Discharge Instructions      Discharge Instructions    Discharge instructions    Complete by:  As directed   Patient will be discharged to [facility].  Continue comfort care measures.  Patient may follow up with primary care provider as needed.  Patient should continue medications as prescribed.  Patient should follow a dysphagia 3 diet.            Medication List    STOP taking  these medications        aspirin EC 81 MG tablet     CALCIUM MAGNESIUM PO     clopidogrel 75 MG tablet  Commonly known as:  PLAVIX      Fish Oil 1200 MG Caps     HYDROcodone-acetaminophen 5-325 MG tablet  Commonly known as:  NORCO/VICODIN     polyethylene glycol packet  Commonly known as:  MIRALAX / GLYCOLAX      TAKE these medications        acetaminophen 325 MG tablet  Commonly known as:  TYLENOL  Take 2 tablets (650 mg total) by mouth every 6 (six) hours as needed for mild pain (or Fever >/= 101).     levothyroxine 25 MCG tablet  Commonly known as:  SYNTHROID, LEVOTHROID  Take 1 tablet (25 mcg total) by mouth daily before breakfast.     metoprolol tartrate 25 MG tablet  Commonly known as:  LOPRESSOR  Take 1 tablet (25 mg total) by mouth 2 (two) times daily.     pantoprazole 20 MG tablet  Commonly known as:  PROTONIX  Take 20 mg by mouth daily as needed for heartburn.     traMADol 50 MG tablet  Commonly known as:  ULTRAM  Take 2 tablets (100 mg total) by mouth every 6 (six) hours as needed for moderate pain.       Allergies  Allergen Reactions  . Penicillins Other (See Comments)    Has patient had a PCN reaction causing immediate rash, facial/tongue/throat swelling, SOB or lightheadedness with hypotension: unknown Has patient had a PCN reaction causing severe rash involving mucus membranes or skin necrosis: unknown Has patient had a PCN reaction that required hospitalization unknown Has patient had a PCN reaction occurring within the last 10 years: no If all of the above answers are "NO", then may proceed with Cephalosporin use.       The results of significant diagnostics from this hospitalization (including imaging, microbiology, ancillary and laboratory) are listed below for reference.    Significant Diagnostic Studies: Dg Lumbar Spine 2-3 Views  02/13/2016  CLINICAL DATA:  Low back pain, no known injury EXAM: LUMBAR SPINE - 2-3 VIEW COMPARISON:  01/18/2016 FINDINGS: Three views of lumbar spine submitted. No acute fracture or subluxation. Mild disc space flattening with anterior spurring noted  at L2-L3 level. Minimal disc space flattening at L5-S1 level. Mild facet degenerative changes noted L5 level. The alignment and vertebral body heights are preserved. There is probable Schmorl's node deformity lower endplate of L1 vertebral body. IMPRESSION: No acute fracture or subluxation. Mild degenerative changes L2-L3 and L5-S1 level. Alignment and vertebral body heights are preserved. Electronically Signed   By: Lahoma Crocker M.D.   On: 02/13/2016 15:20   Dg Chest Port 1 View  02/11/2016  CLINICAL DATA:  Fevers and shortness of Breath EXAM: PORTABLE CHEST 1 VIEW COMPARISON:  01/14/2016 FINDINGS: Cardiac shadow is stable. Diffuse interstitial changes are noted bilaterally without frank pulmonary edema. No focal confluent infiltrate is seen. No bony abnormality is noted. IMPRESSION: Diffuse interstitial changes which may represents a a degree of bronchitis or congestive failure. No frank edema is seen. Electronically Signed   By: Inez Catalina M.D.   On: 02/11/2016 18:55    Microbiology: No results found for this or any previous visit (from the past 240 hour(s)).   Labs: Basic Metabolic Panel:  Recent Labs Lab 02/18/16 0552 02/19/16 0555 02/20/16 0545 02/21/16 0503 02/22/16 0517  NA 134* 140 137  139 145  K 3.7 4.3 3.6 3.6 4.1  CL 102 106 110 110 112*  CO2 21* 23 21* 22 24  GLUCOSE 94 122* 119* 113* 123*  BUN 20 18 14 13 14   CREATININE 0.59 0.64 0.44 0.46 0.51  CALCIUM 8.5* 8.5* 7.9* 7.7* 7.9*   Liver Function Tests:  Recent Labs Lab 02/19/16 0555  AST 28  ALT 27  ALKPHOS 158*  BILITOT 1.2  PROT 6.3*  ALBUMIN 3.4*   No results for input(s): LIPASE, AMYLASE in the last 168 hours. No results for input(s): AMMONIA in the last 168 hours. CBC:  Recent Labs Lab 02/18/16 0552 02/19/16 0555 02/20/16 0545 02/21/16 0503 02/22/16 0517  WBC 6.9 8.2 8.2 6.3 7.0  HGB 11.7* 12.0 11.2* 11.3* 11.8*  HCT 36.9 38.3 34.7* 36.5 38.5  MCV 98.4 99.0 97.7 100.3* 101.9*  PLT 434* 433* 342  336 322   Cardiac Enzymes: No results for input(s): CKTOTAL, CKMB, CKMBINDEX, TROPONINI in the last 168 hours. BNP: BNP (last 3 results)  Recent Labs  02/11/16 2034  BNP 396.7*    ProBNP (last 3 results) No results for input(s): PROBNP in the last 8760 hours.  CBG: No results for input(s): GLUCAP in the last 168 hours.     SignedCristal Ford  [facility] 02/23/2016, 10:13 AM

## 2016-02-23 NOTE — Discharge Instructions (Signed)
Hospice °Hospice is a service that is designed to provide people who are terminally ill and their families with medical, spiritual, and psychological support. Its aim is to improve your quality of life by keeping you as alert and comfortable as possible. Hospice is performed by a team of health care professionals and volunteers who: °· Help keep you comfortable. Hospice can be provided in your home or in a homelike setting. The hospice staff works with your family and friends to help meet your needs. You will enjoy the support of loved ones by receiving much of your basic care from family and friends. °· Provide pain relief and manage your symptoms. The staff supply all necessary medicines and equipment. °· Provide companionship when you are alone. °· Allow you and your family to rest. They may do light housekeeping, prepare meals, and run errands. °· Provide counseling. They will make sure your emotional, spiritual, and social needs and those of your family are being met. °· Provide spiritual care. Spiritual care is individualized to meet your needs and your family's needs. It may involve helping you look at what death means to you, say goodbye, or perform a specific religious ceremony or ritual. °Hospice teams often include: °· A nurse. °· A doctor. °· Social workers. °· Religious leaders (such as a chaplain). °· Trained volunteers. °WHEN SHOULD HOSPICE CARE BEGIN? °Most people who use hospice are believed to have fewer than 6 months to live. Your family and health care providers can help you decide when hospice services should begin. If your condition improves, you may discontinue the program. °WHAT SHOULD I CONSIDER BEFORE SELECTING A PROGRAM? °Most hospice programs are run by nonprofit, independent organizations. Some are affiliated with hospitals, nursing homes, or home health care agencies. Hospice programs can take place in the home or at a hospice center, hospital, or skilled nursing facility. When choosing  a hospice program, ask the following questions: °· What services are available to me? °· What services are offered to my loved ones? °· How involved are my loved ones? °· How involved is my health care provider? °· Who makes up the hospice care team? How are they trained or screened? °· How will my pain and symptoms be managed? °· If my circumstances change, can the services be provided in a different setting, such as my home or in the hospital? °· Is the program reviewed and licensed by the state or certified in some other way? °WHERE CAN I LEARN MORE ABOUT HOSPICE? °You can learn about existing hospice programs in your area from your health care providers. You can also read more about hospice online. The websites of the following organizations contain helpful information: °· The National Hospice and Palliative Care Organization (NHPCO). °· The Hospice Association of America (HAA). °· The Hospice Education Institute. °· The American Cancer Society (ACS). °· Hospice Net. °  °This information is not intended to replace advice given to you by your health care provider. Make sure you discuss any questions you have with your health care provider. °  °Document Released: 03/21/2004 Document Revised: 12/08/2013 Document Reviewed: 10/13/2013 °Elsevier Interactive Patient Education ©2016 Elsevier Inc. ° °

## 2016-02-23 NOTE — Progress Notes (Signed)
Patient is set to discharge to [facility] today. Patient & daughter, [name] aware. Discharge packet given to RN, Doroteo Bradford. [facility] called for transport.     Raynaldo Opitz, [facility] Clinical Social Worker cell #: [phone]

## 2016-03-17 DEATH — deceased

## 2017-12-07 IMAGING — DX DG ELBOW COMPLETE 3+V*L*
4 series · 4 of 4 positions shown · non-contrast
Comparison: None.

CLINICAL DATA: Fell off bottom step now with left elbow pain.

EXAM:
LEFT ELBOW - COMPLETE 3+ VIEW

[elbow ap]
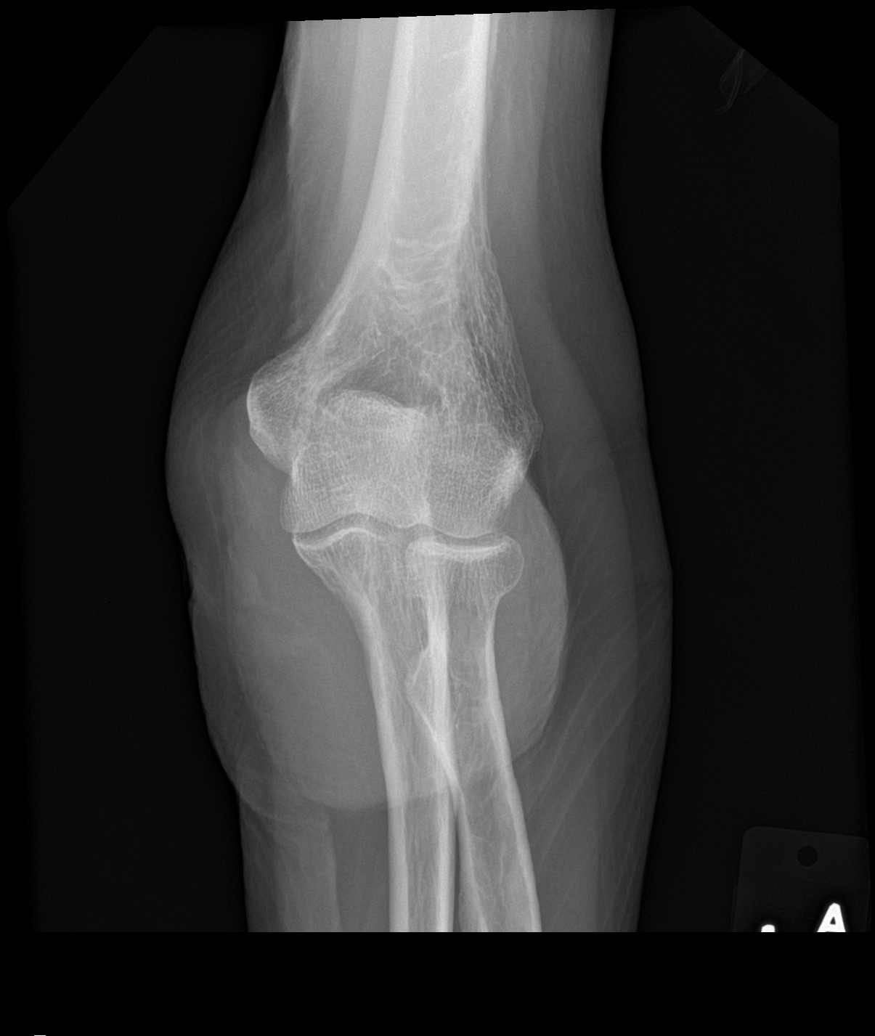

[elbow obl (1 of 2)]
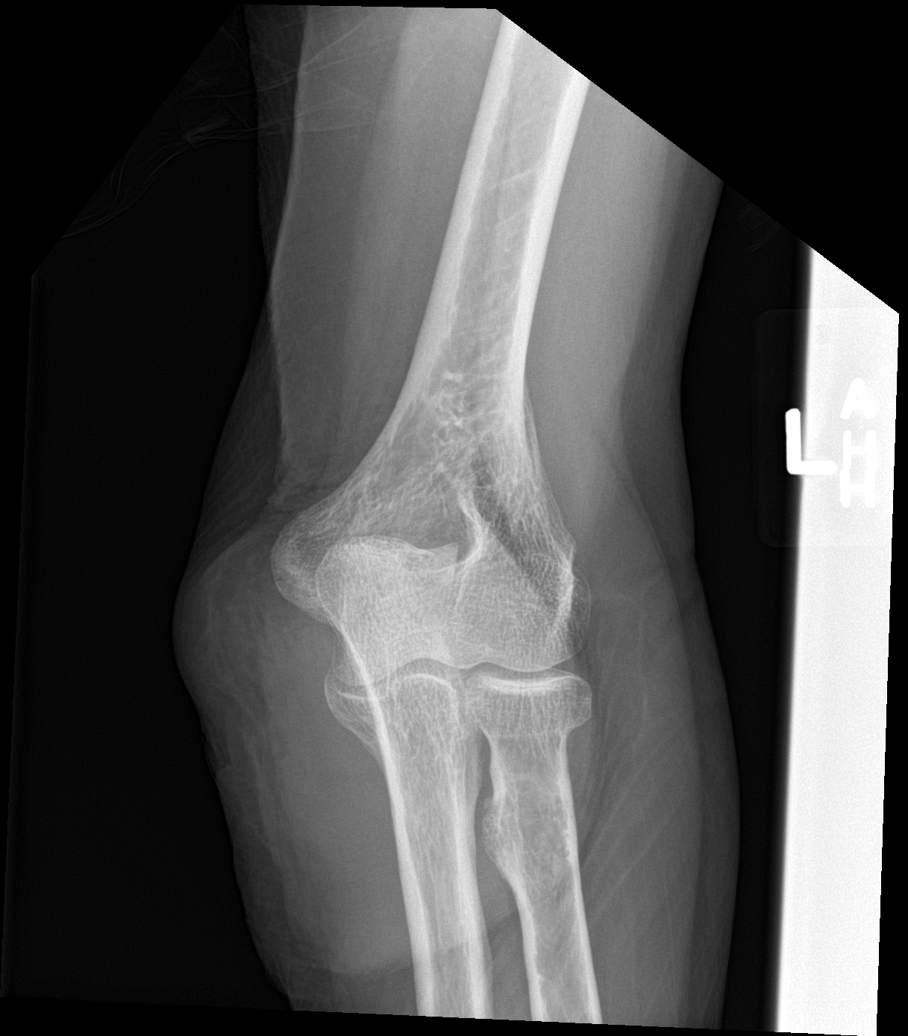

[elbow obl (2 of 2)]
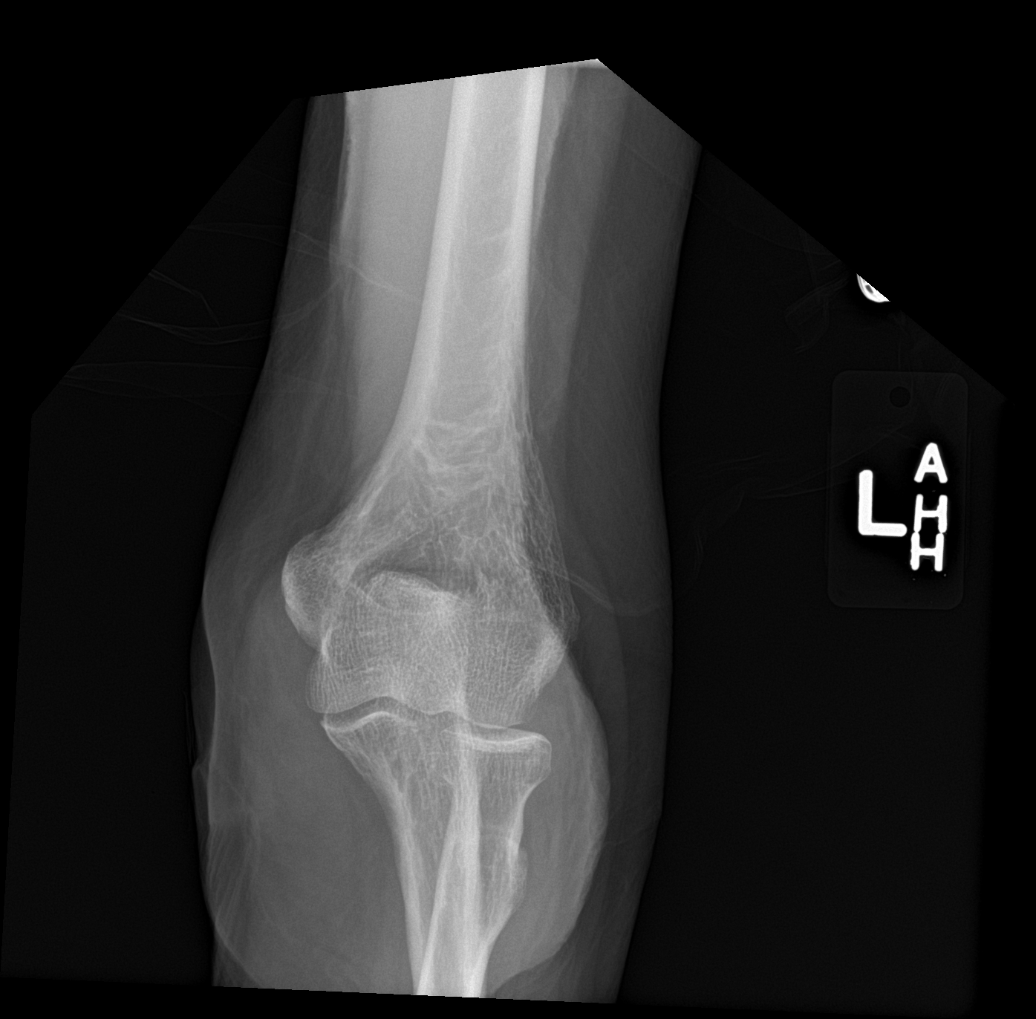

[elbow lat]
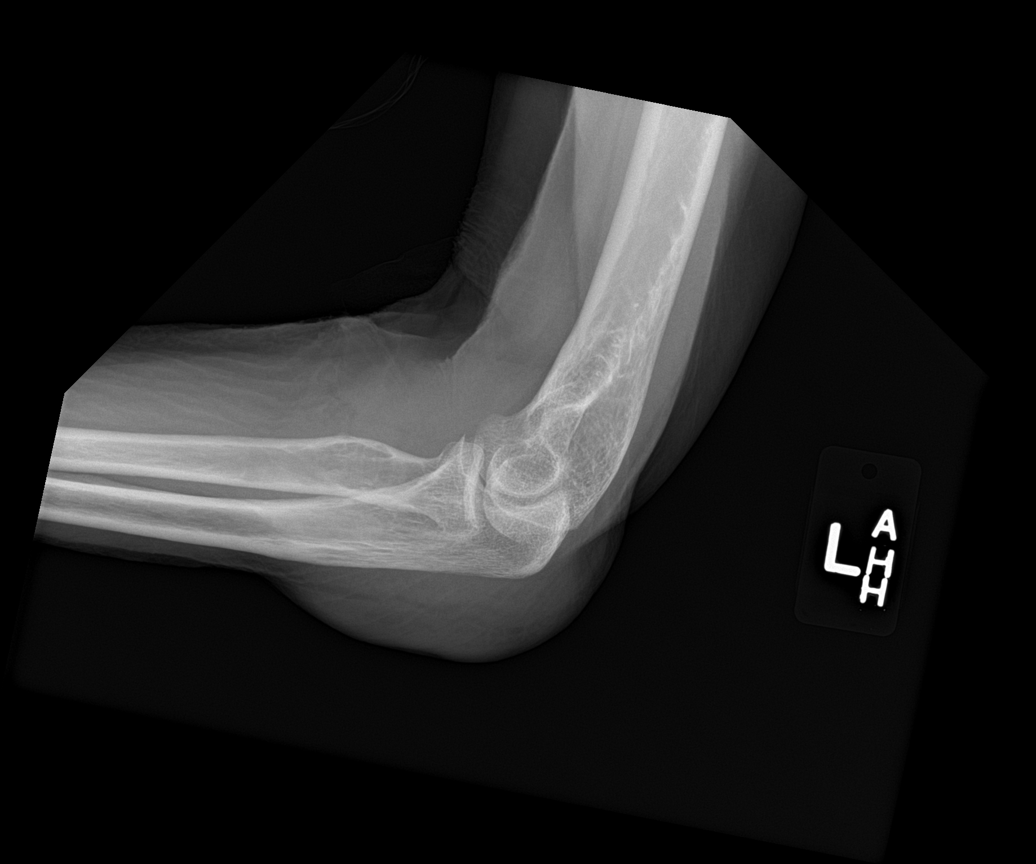

[4 of 4 positions shown; findings below may reference images not displayed]

FINDINGS: There is marked soft tissue swelling about the olecranon process,
however this finding is without associated fracture, radiopaque
foreign body or elbow joint effusion. Joint spaces are preserved.
IMPRESSION: Marked soft tissue swelling about the olecranon process without
associated fracture, radiopaque foreign body or elbow joint
effusion.

## 2017-12-07 IMAGING — CR DG LUMBAR SPINE COMPLETE 4+V
5 series · 5 of 5 positions shown · non-contrast
Comparison: 11/20/2005

CLINICAL DATA: Fall from stairs with low back pain, initial
encounter

EXAM:
LUMBAR SPINE - COMPLETE 4+ VIEW

[t l-spine a.p.]
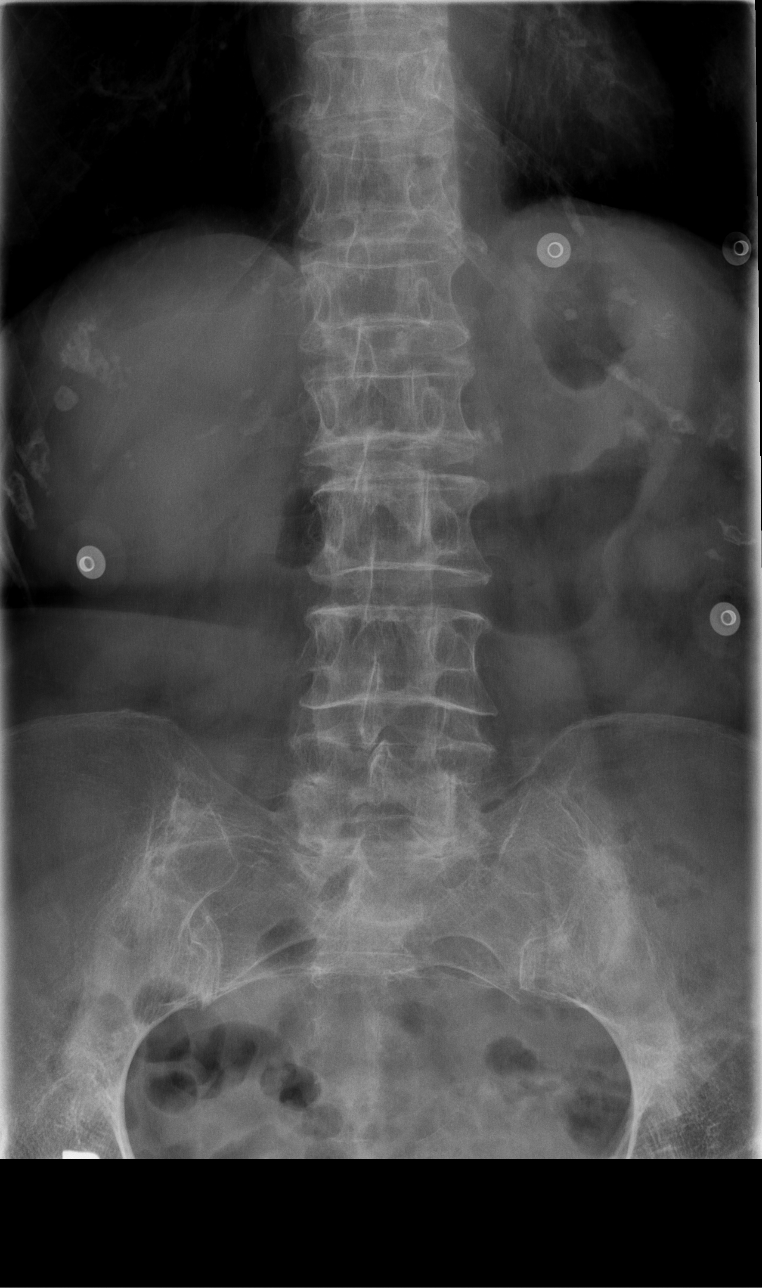

[t l-spine oblique exposure (1 of 2)]
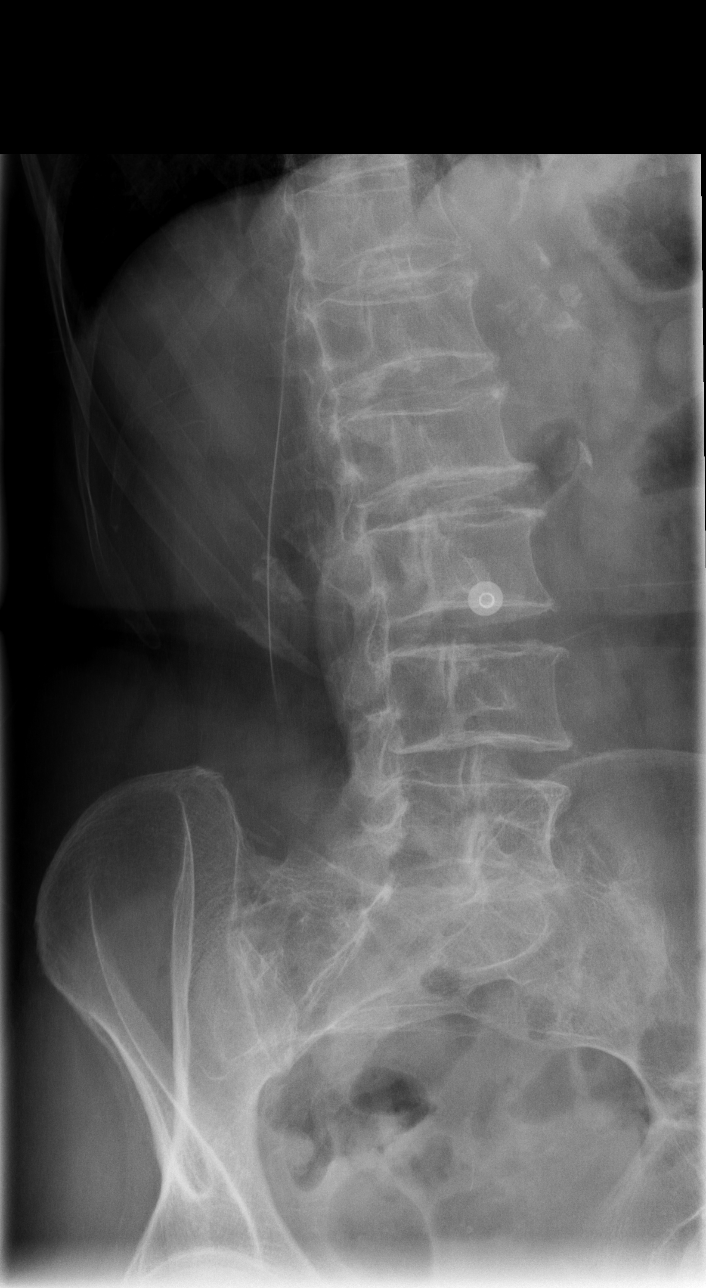

[t l-spine oblique exposure (2 of 2)]
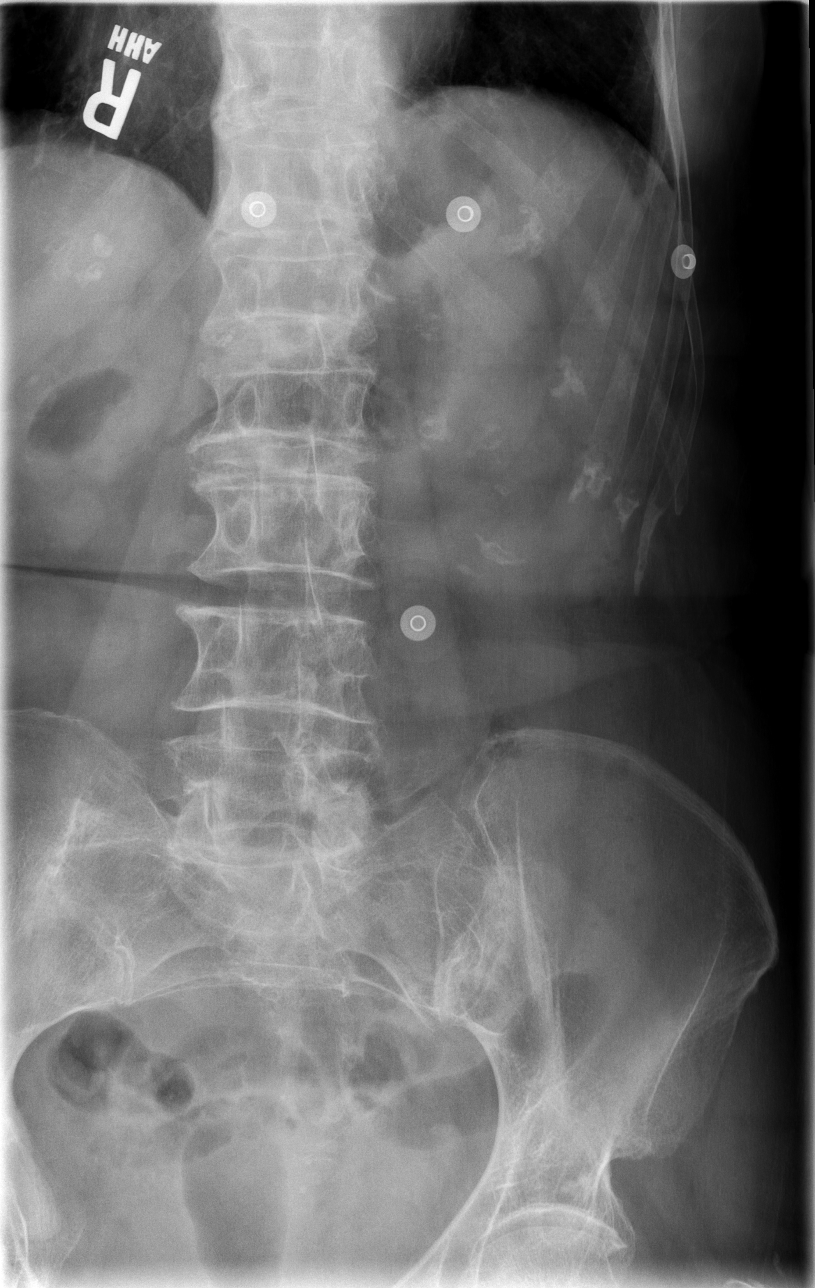

[t l-spine lat]
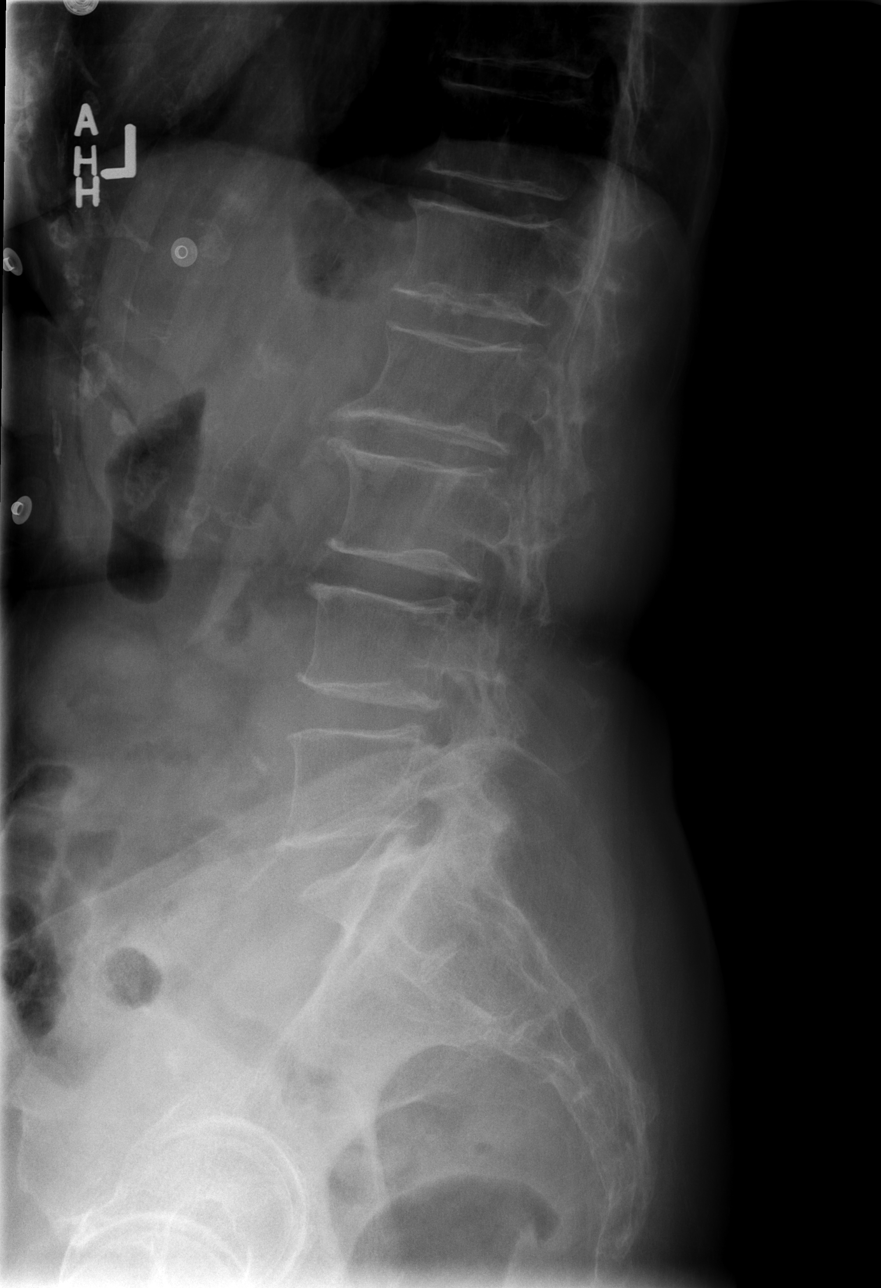

[t l-spine l5-s1 spot]
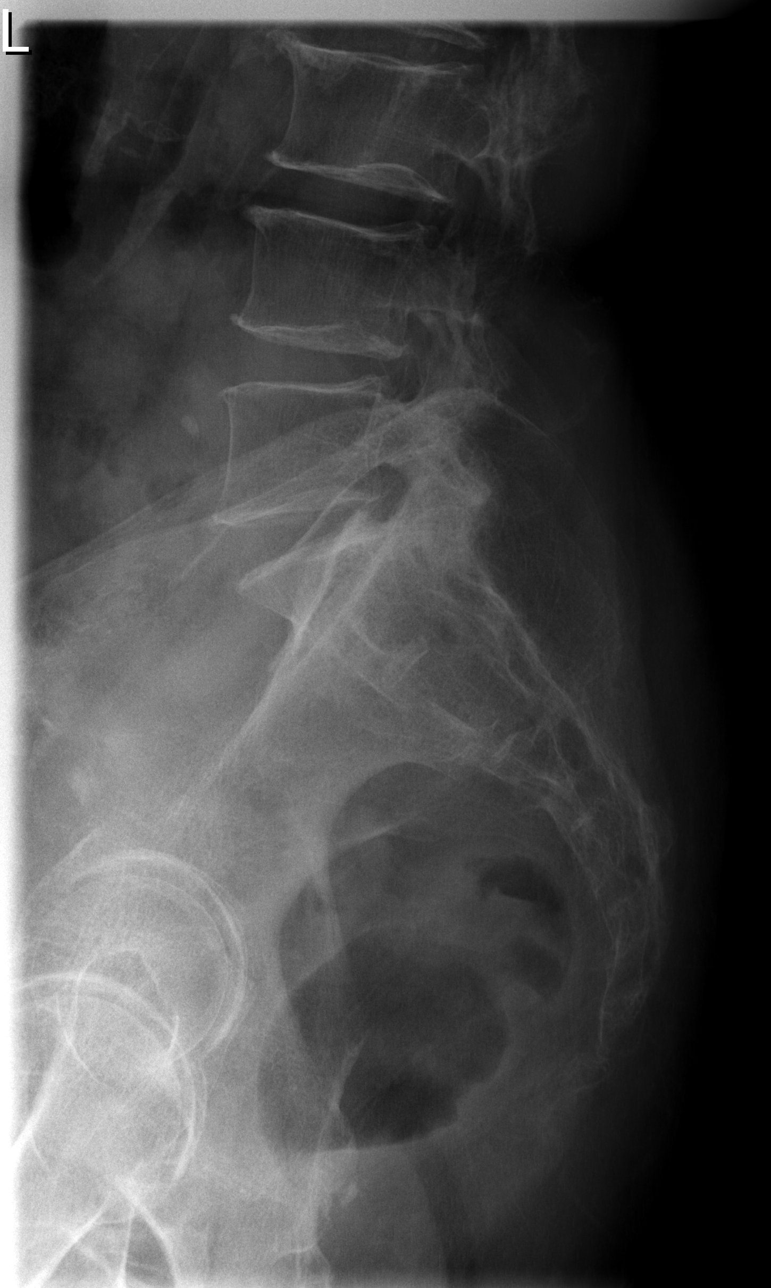

[5 of 5 positions shown; findings below may reference images not displayed]

FINDINGS: Five lumbar type vertebral bodies are well visualized. Vertebral
body height is well maintained. Mild osteophytic changes are seen.
No spondylolisthesis is noted. No other focal abnormality is seen.
IMPRESSION: Mild degenerative change without acute abnormality per
